# Patient Record
Sex: Male | Born: 1955 | Race: White | Hispanic: No | Marital: Married | Smoking: Never smoker
Health system: Southern US, Community
[De-identification: ages and names within clinical notes are randomized; demographics above are authoritative.]

## PROBLEM LIST (undated history)

## (undated) DIAGNOSIS — D759 Disease of blood and blood-forming organs, unspecified: Secondary | ICD-10-CM

## (undated) DIAGNOSIS — J189 Pneumonia, unspecified organism: Secondary | ICD-10-CM

## (undated) DIAGNOSIS — H3320 Serous retinal detachment, unspecified eye: Secondary | ICD-10-CM

## (undated) DIAGNOSIS — C801 Malignant (primary) neoplasm, unspecified: Secondary | ICD-10-CM

## (undated) DIAGNOSIS — Z8546 Personal history of malignant neoplasm of prostate: Secondary | ICD-10-CM

## (undated) DIAGNOSIS — F419 Anxiety disorder, unspecified: Secondary | ICD-10-CM

## (undated) DIAGNOSIS — Z85820 Personal history of malignant melanoma of skin: Secondary | ICD-10-CM

## (undated) DIAGNOSIS — Z86010 Personal history of colonic polyps: Secondary | ICD-10-CM

## (undated) DIAGNOSIS — J45909 Unspecified asthma, uncomplicated: Secondary | ICD-10-CM

## (undated) DIAGNOSIS — Z973 Presence of spectacles and contact lenses: Secondary | ICD-10-CM

## (undated) DIAGNOSIS — T7840XA Allergy, unspecified, initial encounter: Secondary | ICD-10-CM

## (undated) DIAGNOSIS — Z85828 Personal history of other malignant neoplasm of skin: Secondary | ICD-10-CM

## (undated) DIAGNOSIS — C61 Malignant neoplasm of prostate: Secondary | ICD-10-CM

## (undated) DIAGNOSIS — G4733 Obstructive sleep apnea (adult) (pediatric): Secondary | ICD-10-CM

## (undated) DIAGNOSIS — K219 Gastro-esophageal reflux disease without esophagitis: Secondary | ICD-10-CM

## (undated) DIAGNOSIS — M1990 Unspecified osteoarthritis, unspecified site: Secondary | ICD-10-CM

## (undated) DIAGNOSIS — S83209A Unspecified tear of unspecified meniscus, current injury, unspecified knee, initial encounter: Secondary | ICD-10-CM

## (undated) DIAGNOSIS — I2510 Atherosclerotic heart disease of native coronary artery without angina pectoris: Secondary | ICD-10-CM

## (undated) DIAGNOSIS — D696 Thrombocytopenia, unspecified: Secondary | ICD-10-CM

## (undated) DIAGNOSIS — Z8669 Personal history of other diseases of the nervous system and sense organs: Secondary | ICD-10-CM

## (undated) DIAGNOSIS — E119 Type 2 diabetes mellitus without complications: Secondary | ICD-10-CM

## (undated) DIAGNOSIS — Z9889 Other specified postprocedural states: Secondary | ICD-10-CM

## (undated) DIAGNOSIS — E785 Hyperlipidemia, unspecified: Secondary | ICD-10-CM

## (undated) DIAGNOSIS — H269 Unspecified cataract: Secondary | ICD-10-CM

## (undated) DIAGNOSIS — Z9109 Other allergy status, other than to drugs and biological substances: Secondary | ICD-10-CM

## (undated) DIAGNOSIS — G4730 Sleep apnea, unspecified: Secondary | ICD-10-CM

## (undated) HISTORY — DX: Allergy, unspecified, initial encounter: T78.40XA

## (undated) HISTORY — PX: COLONOSCOPY: SHX174

## (undated) HISTORY — PX: NASAL SEPTOPLASTY W/ TURBINOPLASTY: SHX2070

## (undated) HISTORY — PX: TONSILLECTOMY AND ADENOIDECTOMY: SUR1326

## (undated) HISTORY — PX: REPLACEMENT TOTAL KNEE: SUR1224

## (undated) HISTORY — PX: EYE SURGERY: SHX253

## (undated) HISTORY — DX: Unspecified asthma, uncomplicated: J45.909

## (undated) HISTORY — DX: Malignant (primary) neoplasm, unspecified: C80.1

## (undated) HISTORY — PX: OTHER SURGICAL HISTORY: SHX169

## (undated) HISTORY — DX: Thrombocytopenia, unspecified: D69.6

## (undated) HISTORY — DX: Hyperlipidemia, unspecified: E78.5

## (undated) HISTORY — DX: Anxiety disorder, unspecified: F41.9

## (undated) HISTORY — DX: Serous retinal detachment, unspecified eye: H33.20

## (undated) HISTORY — DX: Unspecified cataract: H26.9

## (undated) HISTORY — DX: Personal history of colonic polyps: Z86.010

---

## 2001-12-13 ENCOUNTER — Ambulatory Visit (HOSPITAL_BASED_OUTPATIENT_CLINIC_OR_DEPARTMENT_OTHER): Admission: RE | Admit: 2001-12-13 | Discharge: 2001-12-13 | Payer: Self-pay | Admitting: Otolaryngology

## 2002-12-25 ENCOUNTER — Encounter: Admission: RE | Admit: 2002-12-25 | Discharge: 2003-03-25 | Payer: Self-pay | Admitting: Endocrinology

## 2004-05-17 ENCOUNTER — Ambulatory Visit (HOSPITAL_BASED_OUTPATIENT_CLINIC_OR_DEPARTMENT_OTHER): Admission: RE | Admit: 2004-05-17 | Discharge: 2004-05-17 | Payer: Self-pay | Admitting: Internal Medicine

## 2004-09-03 ENCOUNTER — Ambulatory Visit: Payer: Self-pay | Admitting: Internal Medicine

## 2004-12-22 ENCOUNTER — Ambulatory Visit: Payer: Self-pay | Admitting: Internal Medicine

## 2005-08-02 ENCOUNTER — Ambulatory Visit: Payer: Self-pay | Admitting: Internal Medicine

## 2006-04-04 ENCOUNTER — Ambulatory Visit: Payer: Self-pay | Admitting: Internal Medicine

## 2006-04-04 LAB — CONVERTED CEMR LAB
ALT: 21 units/L (ref 0–40)
AST: 21 units/L (ref 0–37)
Albumin: 4.2 g/dL (ref 3.5–5.2)
Alkaline Phosphatase: 51 units/L (ref 39–117)
BUN: 21 mg/dL (ref 6–23)
Basophils Absolute: 0 10*3/uL (ref 0.0–0.1)
Basophils Relative: 0.5 % (ref 0.0–1.0)
Bilirubin Urine: NEGATIVE
Calcium: 9.2 mg/dL (ref 8.4–10.5)
Chol/HDL Ratio, serum: 2.7
Cholesterol: 117 mg/dL (ref 0–200)
Glomerular Filtration Rate, Af Am: 82 mL/min/{1.73_m2}
Hemoglobin: 14.2 g/dL (ref 13.0–17.0)
Ketones, ur: NEGATIVE mg/dL
LDL Cholesterol: 68 mg/dL (ref 0–99)
Leukocytes, UA: NEGATIVE
Lymphocytes Relative: 17.4 % (ref 12.0–46.0)
Monocytes Relative: 5.9 % (ref 3.0–11.0)
Mucus, UA: NEGATIVE
Neutrophils Relative %: 75.6 % (ref 43.0–77.0)
Nitrite: NEGATIVE
PSA: 0.84 ng/mL (ref 0.10–4.00)
Platelets: 143 10*3/uL — ABNORMAL LOW (ref 150–400)
Potassium: 3.5 meq/L (ref 3.5–5.1)
RDW: 12.2 % (ref 11.5–14.6)
TSH: 0.73 microintl units/mL (ref 0.35–5.50)
Total Bilirubin: 1.1 mg/dL (ref 0.3–1.2)
Total CK: 238 units/L (ref 7–195)
Total Protein, Urine: NEGATIVE mg/dL
Total Protein: 6 g/dL (ref 6.0–8.3)
Triglyceride fasting, serum: 29 mg/dL (ref 0–149)
Urine Glucose: 100 mg/dL — AB
Urobilinogen, UA: 0.2 (ref 0.0–1.0)
WBC: 4.3 10*3/uL — ABNORMAL LOW (ref 4.5–10.5)

## 2006-04-12 ENCOUNTER — Ambulatory Visit: Payer: Self-pay | Admitting: Internal Medicine

## 2006-10-06 ENCOUNTER — Encounter (INDEPENDENT_AMBULATORY_CARE_PROVIDER_SITE_OTHER): Payer: Self-pay | Admitting: Specialist

## 2006-10-06 ENCOUNTER — Ambulatory Visit: Payer: Self-pay | Admitting: Internal Medicine

## 2006-10-25 ENCOUNTER — Ambulatory Visit: Payer: Self-pay | Admitting: Internal Medicine

## 2006-10-25 LAB — CONVERTED CEMR LAB
AST: 26 units/L (ref 0–37)
Albumin: 4.1 g/dL (ref 3.5–5.2)
Basophils Absolute: 0 10*3/uL (ref 0.0–0.1)
Bilirubin, Direct: 0.2 mg/dL (ref 0.0–0.3)
Cholesterol: 109 mg/dL (ref 0–200)
Creatinine,U: 164.1 mg/dL
Eosinophils Absolute: 0.1 10*3/uL (ref 0.0–0.6)
Eosinophils Relative: 1.2 % (ref 0.0–5.0)
GFR calc Af Amer: 101 mL/min
GFR calc non Af Amer: 84 mL/min
Glucose, Bld: 106 mg/dL — ABNORMAL HIGH (ref 70–99)
HCT: 38 % — ABNORMAL LOW (ref 39.0–52.0)
Hgb A1c MFr Bld: 5.4 % (ref 4.6–6.0)
Lymphocytes Relative: 22.5 % (ref 12.0–46.0)
MCHC: 35 g/dL (ref 30.0–36.0)
MCV: 91.1 fL (ref 78.0–100.0)
Microalb Creat Ratio: 9.1 mg/g (ref 0.0–30.0)
Microalb, Ur: 1.5 mg/dL (ref 0.0–1.9)
Monocytes Absolute: 0.3 10*3/uL (ref 0.2–0.7)
Neutro Abs: 3.2 10*3/uL (ref 1.4–7.7)
Neutrophils Relative %: 69.6 % (ref 43.0–77.0)
Potassium: 4.8 meq/L (ref 3.5–5.1)
Sodium: 149 meq/L — ABNORMAL HIGH (ref 135–145)
Testosterone: 391.39 ng/dL (ref 350.00–890)
Total CHOL/HDL Ratio: 2.4
WBC: 4.6 10*3/uL (ref 4.5–10.5)

## 2007-02-03 DIAGNOSIS — R809 Proteinuria, unspecified: Secondary | ICD-10-CM | POA: Insufficient documentation

## 2007-02-03 DIAGNOSIS — E119 Type 2 diabetes mellitus without complications: Secondary | ICD-10-CM

## 2007-02-03 DIAGNOSIS — G4739 Other sleep apnea: Secondary | ICD-10-CM

## 2007-02-03 DIAGNOSIS — J45909 Unspecified asthma, uncomplicated: Secondary | ICD-10-CM | POA: Insufficient documentation

## 2007-02-03 DIAGNOSIS — E785 Hyperlipidemia, unspecified: Secondary | ICD-10-CM | POA: Insufficient documentation

## 2007-03-15 ENCOUNTER — Ambulatory Visit: Payer: Self-pay | Admitting: Internal Medicine

## 2007-03-15 LAB — CONVERTED CEMR LAB
AST: 41 units/L — ABNORMAL HIGH (ref 0–37)
Albumin: 4.3 g/dL (ref 3.5–5.2)
Alkaline Phosphatase: 35 units/L — ABNORMAL LOW (ref 39–117)
BUN: 21 mg/dL (ref 6–23)
Basophils Relative: 0.1 % (ref 0.0–1.0)
Bilirubin Urine: NEGATIVE
Eosinophils Relative: 1.2 % (ref 0.0–5.0)
GFR calc Af Amer: 82 mL/min
GFR calc non Af Amer: 68 mL/min
Hemoglobin: 13.1 g/dL (ref 13.0–17.0)
Leukocytes, UA: NEGATIVE
Monocytes Relative: 6.7 % (ref 3.0–11.0)
Nitrite: NEGATIVE
Platelets: 139 10*3/uL — ABNORMAL LOW (ref 150–400)
Potassium: 4.8 meq/L (ref 3.5–5.1)
RDW: 13 % (ref 11.5–14.6)
Specific Gravity, Urine: 1.02 (ref 1.000–1.03)
Total Bilirubin: 1.1 mg/dL (ref 0.3–1.2)
Total Protein, Urine: NEGATIVE mg/dL
Total Protein: 6.5 g/dL (ref 6.0–8.3)
Triglycerides: 39 mg/dL (ref 0–149)
Urine Glucose: NEGATIVE mg/dL
VLDL: 8 mg/dL (ref 0–40)
WBC: 5.5 10*3/uL (ref 4.5–10.5)
pH: 6.5 (ref 5.0–8.0)

## 2007-05-31 ENCOUNTER — Encounter: Payer: Self-pay | Admitting: Internal Medicine

## 2007-05-31 ENCOUNTER — Telehealth: Payer: Self-pay | Admitting: Internal Medicine

## 2007-09-12 ENCOUNTER — Ambulatory Visit: Payer: Self-pay | Admitting: Internal Medicine

## 2007-09-16 LAB — CONVERTED CEMR LAB
ALT: 26 units/L (ref 0–53)
Albumin: 4.1 g/dL (ref 3.5–5.2)
Alkaline Phosphatase: 51 units/L (ref 39–117)
BUN: 20 mg/dL (ref 6–23)
Basophils Relative: 0.7 % (ref 0.0–1.0)
Bilirubin Urine: NEGATIVE
Calcium: 9.7 mg/dL (ref 8.4–10.5)
Crystals: NEGATIVE
GFR calc Af Amer: 90 mL/min
GFR calc non Af Amer: 75 mL/min
Hgb A1c MFr Bld: 5.8 % (ref 4.6–6.0)
LDL Cholesterol: 66 mg/dL (ref 0–99)
Lymphocytes Relative: 16.1 % (ref 12.0–46.0)
Microalb Creat Ratio: 27 mg/g (ref 0.0–30.0)
Monocytes Relative: 5.8 % (ref 3.0–11.0)
Neutro Abs: 4.7 10*3/uL (ref 1.4–7.7)
Platelets: 145 10*3/uL — ABNORMAL LOW (ref 150–400)
Specific Gravity, Urine: 1.025 (ref 1.000–1.03)
TSH: 0.89 microintl units/mL (ref 0.35–5.50)
Testosterone: 343.23 ng/dL — ABNORMAL LOW (ref 350.00–890)
Total CHOL/HDL Ratio: 2.7
Total Protein, Urine: NEGATIVE mg/dL
Triglycerides: 23 mg/dL (ref 0–149)
Urine Glucose: NEGATIVE mg/dL
VLDL: 5 mg/dL (ref 0–40)
WBC, UA: NONE SEEN cells/hpf
pH: 5.5 (ref 5.0–8.0)

## 2007-10-30 ENCOUNTER — Encounter: Payer: Self-pay | Admitting: Pulmonary Disease

## 2007-10-30 DIAGNOSIS — G4733 Obstructive sleep apnea (adult) (pediatric): Secondary | ICD-10-CM

## 2007-11-27 ENCOUNTER — Encounter: Payer: Self-pay | Admitting: Critical Care Medicine

## 2008-01-22 ENCOUNTER — Telehealth: Payer: Self-pay | Admitting: Internal Medicine

## 2008-01-29 ENCOUNTER — Ambulatory Visit: Payer: Self-pay | Admitting: Internal Medicine

## 2008-01-29 LAB — CONVERTED CEMR LAB
ALT: 24 units/L (ref 0–53)
AST: 27 units/L (ref 0–37)
Alkaline Phosphatase: 43 units/L (ref 39–117)
Basophils Absolute: 0 10*3/uL (ref 0.0–0.1)
Bilirubin Urine: NEGATIVE
Bilirubin, Direct: 0.2 mg/dL (ref 0.0–0.3)
CO2: 29 meq/L (ref 19–32)
Calcium: 9.6 mg/dL (ref 8.4–10.5)
Chloride: 108 meq/L (ref 96–112)
Glucose, Bld: 95 mg/dL (ref 70–99)
Hemoglobin: 14.1 g/dL (ref 13.0–17.0)
LDL Cholesterol: 55 mg/dL (ref 0–99)
Leukocytes, UA: NEGATIVE
Lymphocytes Relative: 21.1 % (ref 12.0–46.0)
Microalb, Ur: 0.2 mg/dL (ref 0.0–1.9)
Monocytes Relative: 6.4 % (ref 3.0–12.0)
Neutro Abs: 3.6 10*3/uL (ref 1.4–7.7)
Neutrophils Relative %: 71.3 % (ref 43.0–77.0)
Nitrite: NEGATIVE
RBC: 4.27 M/uL (ref 4.22–5.81)
RDW: 11.8 % (ref 11.5–14.6)
Sodium: 142 meq/L (ref 135–145)
Specific Gravity, Urine: 1.015 (ref 1.000–1.03)
Total Bilirubin: 1.1 mg/dL (ref 0.3–1.2)
Total CHOL/HDL Ratio: 2.6
Total Protein: 6.1 g/dL (ref 6.0–8.3)
Urobilinogen, UA: 0.2 (ref 0.0–1.0)
Vit D, 1,25-Dihydroxy: 80 (ref 30–89)
pH: 7.5 (ref 5.0–8.0)

## 2008-02-07 ENCOUNTER — Encounter: Payer: Self-pay | Admitting: Internal Medicine

## 2008-04-14 ENCOUNTER — Telehealth: Payer: Self-pay | Admitting: Internal Medicine

## 2008-08-04 ENCOUNTER — Telehealth: Payer: Self-pay | Admitting: Internal Medicine

## 2008-08-12 ENCOUNTER — Ambulatory Visit: Payer: Self-pay | Admitting: Internal Medicine

## 2008-08-12 LAB — CONVERTED CEMR LAB
Albumin: 4 g/dL (ref 3.5–5.2)
BUN: 27 mg/dL — ABNORMAL HIGH (ref 6–23)
Bacteria, UA: NEGATIVE
Basophils Absolute: 0 10*3/uL (ref 0.0–0.1)
Basophils Relative: 0.3 % (ref 0.0–3.0)
Cholesterol: 119 mg/dL (ref 0–200)
Creatinine, Ser: 1.1 mg/dL (ref 0.4–1.5)
Crystals: NEGATIVE
Eosinophils Absolute: 0.1 10*3/uL (ref 0.0–0.7)
Eosinophils Relative: 1.7 % (ref 0.0–5.0)
GFR calc Af Amer: 90 mL/min
GFR calc non Af Amer: 74 mL/min
HCT: 41.6 % (ref 39.0–52.0)
HDL: 52.5 mg/dL (ref >39.0)
Hgb A1c MFr Bld: 5.7 % (ref 4.6–6.0)
Ketones, ur: NEGATIVE mg/dL
LDL Cholesterol: 58 mg/dL (ref 0–99)
MCHC: 34.8 g/dL (ref 30.0–36.0)
MCV: 93.4 fL (ref 78.0–100.0)
Monocytes Absolute: 0.3 10*3/uL (ref 0.1–1.0)
Mucus, UA: NEGATIVE
Neutrophils Relative %: 66.7 % (ref 43.0–77.0)
PSA: 1.12 ng/mL (ref 0.10–4.00)
Platelets: 121 10*3/uL — ABNORMAL LOW (ref 150–400)
TSH: 0.63 microintl units/mL (ref 0.35–5.50)
Testosterone: 387.9 ng/dL (ref 350.00–890)
Total Bilirubin: 1.4 mg/dL — ABNORMAL HIGH (ref 0.3–1.2)
Total Protein, Urine: NEGATIVE mg/dL
Triglycerides: 42 mg/dL (ref 0–149)
Urine Glucose: NEGATIVE mg/dL
Urobilinogen, UA: 0.2 (ref 0.0–1.0)
VLDL: 8 mg/dL (ref 0–40)
Vit D, 25-Hydroxy: 42 ng/mL (ref 30–89)
WBC: 4.4 10*3/uL — ABNORMAL LOW (ref 4.5–10.5)

## 2008-08-15 ENCOUNTER — Ambulatory Visit: Payer: Self-pay | Admitting: Internal Medicine

## 2008-08-15 DIAGNOSIS — R7989 Other specified abnormal findings of blood chemistry: Secondary | ICD-10-CM | POA: Insufficient documentation

## 2008-08-15 DIAGNOSIS — R945 Abnormal results of liver function studies: Secondary | ICD-10-CM | POA: Insufficient documentation

## 2008-09-05 ENCOUNTER — Telehealth: Payer: Self-pay | Admitting: Internal Medicine

## 2008-12-11 ENCOUNTER — Ambulatory Visit: Payer: Self-pay | Admitting: Internal Medicine

## 2008-12-15 LAB — CONVERTED CEMR LAB
ALT: 35 units/L (ref 0–53)
BUN: 25 mg/dL — ABNORMAL HIGH (ref 6–23)
Basophils Relative: 0.2 % (ref 0.0–3.0)
Calcium: 9.3 mg/dL (ref 8.4–10.5)
Cholesterol: 112 mg/dL (ref 0–200)
Creatinine, Ser: 1.2 mg/dL (ref 0.4–1.5)
Eosinophils Absolute: 0.1 10*3/uL (ref 0.0–0.7)
GFR calc non Af Amer: 67.13 mL/min (ref >60)
HDL: 54 mg/dL (ref >39.00)
LDL Cholesterol: 54 mg/dL (ref 0–99)
MCHC: 34.9 g/dL (ref 30.0–36.0)
MCV: 94.1 fL (ref 78.0–100.0)
Monocytes Absolute: 0.3 10*3/uL (ref 0.1–1.0)
Neutro Abs: 2.9 10*3/uL (ref 1.4–7.7)
Neutrophils Relative %: 66.4 % (ref 43.0–77.0)
RBC: 4.21 M/uL — ABNORMAL LOW (ref 4.22–5.81)
RDW: 12.1 % (ref 11.5–14.6)
Testosterone: 324.13 ng/dL — ABNORMAL LOW (ref 350.00–890.00)
Total Bilirubin: 1.1 mg/dL (ref 0.3–1.2)
Triglycerides: 20 mg/dL (ref 0.0–149.0)

## 2009-02-11 ENCOUNTER — Ambulatory Visit: Payer: Self-pay | Admitting: Sports Medicine

## 2009-02-11 DIAGNOSIS — M722 Plantar fascial fibromatosis: Secondary | ICD-10-CM

## 2009-02-11 DIAGNOSIS — M7660 Achilles tendinitis, unspecified leg: Secondary | ICD-10-CM

## 2009-04-01 ENCOUNTER — Encounter (INDEPENDENT_AMBULATORY_CARE_PROVIDER_SITE_OTHER): Payer: Self-pay | Admitting: *Deleted

## 2009-06-02 ENCOUNTER — Ambulatory Visit: Payer: Self-pay | Admitting: Internal Medicine

## 2009-06-02 LAB — CONVERTED CEMR LAB
BUN: 23 mg/dL (ref 6–23)
Bilirubin, Direct: 0.2 mg/dL (ref 0.0–0.3)
Chloride: 109 meq/L (ref 96–112)
Cholesterol: 146 mg/dL (ref 0–200)
Creatinine,U: 104.1 mg/dL
Eosinophils Absolute: 0.1 10*3/uL (ref 0.0–0.7)
GFR calc non Af Amer: 61.1 mL/min (ref >60)
LDL Cholesterol: 82 mg/dL (ref 0–99)
MCHC: 33.6 g/dL (ref 30.0–36.0)
MCV: 97.3 fL (ref 78.0–100.0)
Microalb Creat Ratio: 9.6 mg/g (ref 0.0–30.0)
Monocytes Absolute: 0.3 10*3/uL (ref 0.1–1.0)
Neutrophils Relative %: 74.3 % (ref 43.0–77.0)
Platelets: 125 10*3/uL — ABNORMAL LOW (ref 150.0–400.0)
Potassium: 5 meq/L (ref 3.5–5.1)
Sodium: 147 meq/L — ABNORMAL HIGH (ref 135–145)
Total Bilirubin: 1.1 mg/dL (ref 0.3–1.2)
Triglycerides: 33 mg/dL (ref 0.0–149.0)
VLDL: 6.6 mg/dL (ref 0.0–40.0)

## 2009-09-16 ENCOUNTER — Encounter: Payer: Self-pay | Admitting: Internal Medicine

## 2009-10-20 ENCOUNTER — Ambulatory Visit: Payer: Self-pay | Admitting: Internal Medicine

## 2009-10-20 LAB — CONVERTED CEMR LAB
AST: 32 units/L (ref 0–37)
Albumin: 4.2 g/dL (ref 3.5–5.2)
Basophils Absolute: 0 10*3/uL (ref 0.0–0.1)
Basophils Relative: 0.8 % (ref 0.0–3.0)
Bilirubin Urine: NEGATIVE
CO2: 28 meq/L (ref 19–32)
Chloride: 109 meq/L (ref 96–112)
Eosinophils Absolute: 0.1 10*3/uL (ref 0.0–0.7)
Glucose, Bld: 96 mg/dL (ref 70–99)
HCT: 40.9 % (ref 39.0–52.0)
HDL: 46.9 mg/dL (ref >39.00)
Hemoglobin: 14.1 g/dL (ref 13.0–17.0)
Hgb A1c MFr Bld: 5.8 % (ref 4.6–6.5)
Leukocytes, UA: NEGATIVE
Lymphs Abs: 1.2 10*3/uL (ref 0.7–4.0)
MCHC: 34.4 g/dL (ref 30.0–36.0)
Neutro Abs: 3 10*3/uL (ref 1.4–7.7)
Nitrite: NEGATIVE
PSA: 1.83 ng/mL (ref 0.10–4.00)
Potassium: 4.1 meq/L (ref 3.5–5.1)
RBC: 4.37 M/uL (ref 4.22–5.81)
RDW: 13.4 % (ref 11.5–14.6)
Sodium: 142 meq/L (ref 135–145)
Specific Gravity, Urine: 1.025 (ref 1.000–1.030)
TSH: 0.68 microintl units/mL (ref 0.35–5.50)
Total CHOL/HDL Ratio: 4
Total Protein, Urine: NEGATIVE mg/dL
Total Protein: 5.8 g/dL — ABNORMAL LOW (ref 6.0–8.3)
Triglycerides: 37 mg/dL (ref 0.0–149.0)
pH: 5.5 (ref 5.0–8.0)

## 2009-10-28 ENCOUNTER — Ambulatory Visit: Payer: Self-pay | Admitting: Internal Medicine

## 2009-10-28 DIAGNOSIS — E291 Testicular hypofunction: Secondary | ICD-10-CM

## 2009-10-28 DIAGNOSIS — D485 Neoplasm of uncertain behavior of skin: Secondary | ICD-10-CM

## 2009-10-28 DIAGNOSIS — H6120 Impacted cerumen, unspecified ear: Secondary | ICD-10-CM

## 2009-10-28 DIAGNOSIS — H60399 Other infective otitis externa, unspecified ear: Secondary | ICD-10-CM | POA: Insufficient documentation

## 2010-01-27 ENCOUNTER — Telehealth: Payer: Self-pay | Admitting: Internal Medicine

## 2010-03-19 ENCOUNTER — Telehealth: Payer: Self-pay | Admitting: Internal Medicine

## 2010-03-24 ENCOUNTER — Telehealth (INDEPENDENT_AMBULATORY_CARE_PROVIDER_SITE_OTHER): Payer: Self-pay | Admitting: *Deleted

## 2010-04-12 ENCOUNTER — Ambulatory Visit: Payer: Self-pay | Admitting: Sports Medicine

## 2010-04-12 DIAGNOSIS — M79609 Pain in unspecified limb: Secondary | ICD-10-CM | POA: Insufficient documentation

## 2010-05-12 ENCOUNTER — Encounter: Payer: Self-pay | Admitting: Internal Medicine

## 2010-06-11 ENCOUNTER — Ambulatory Visit: Payer: Self-pay | Admitting: Internal Medicine

## 2010-06-14 LAB — CONVERTED CEMR LAB
ALT: 33 units/L (ref 0–53)
AST: 34 units/L (ref 0–37)
Alkaline Phosphatase: 50 units/L (ref 39–117)
Bilirubin, Direct: 0.1 mg/dL (ref 0.0–0.3)
CO2: 28 meq/L (ref 19–32)
Calcium: 9.4 mg/dL (ref 8.4–10.5)
Cholesterol: 114 mg/dL (ref 0–200)
Creatinine,U: 93.2 mg/dL
Hgb A1c MFr Bld: 5.8 % (ref 4.6–6.5)
Ketones, ur: NEGATIVE mg/dL
LDL Cholesterol: 58 mg/dL (ref 0–99)
Sodium: 141 meq/L (ref 135–145)
Specific Gravity, Urine: 1.025 (ref 1.000–1.030)
Total Protein, Urine: NEGATIVE mg/dL
Total Protein: 6.4 g/dL (ref 6.0–8.3)
Triglycerides: 36 mg/dL (ref 0.0–149.0)
Urine Glucose: NEGATIVE mg/dL
pH: 5.5 (ref 5.0–8.0)

## 2010-07-27 NOTE — Miscellaneous (Signed)
Summary: Lipitor  Clinical Lists Changes  Medications: Rx of LIPITOR 20 MG TABS (ATORVASTATIN CALCIUM) Take 1 tab by mouth daily;  #90 x 3;  Signed;  Entered by: Tresa Garter MD;  Authorized by: Tresa Garter MD;  Method used: Electronically to [facility]*, [address], [address] Shipping/mailing, [place], [place]  [address], Ph: [phone], Fax: [phone]    Prescriptions: LIPITOR 20 MG TABS (ATORVASTATIN CALCIUM) Take 1 tab by mouth daily  #90 x 3   Entered and Authorized by:   Tresa Garter MD   Signed by:   Tresa Garter MD on 05/12/2010   Method used:   Electronically to        [facility]* (retail)       [address]       [address] Shipping/mailing       [place], [place]  [address]       Ph: [phone]       Fax: [phone]   RxID:   [id number]

## 2010-07-27 NOTE — Assessment & Plan Note (Signed)
Summary: CPX/ ALREADY DID THE LABS/NWS   Vital Signs:  Patient profile:   55 year old male Height:      73 inches Weight:      194.25 pounds BMI:     25.72 O2 Sat:      96 % on Room air Temp:     97.6 degrees F oral Pulse rate:   59 / minute BP sitting:   100 / 50  (left arm) Cuff size:   regular  Vitals Entered By: Lucious Groves (Oct 28, 2009 10:38 AM)  O2 Flow:  Room  air  CC: CPX./kb Is Patient Diabetic? Yes Pain Assessment Patient in pain? no        CC:  CPX./kb.  History of Present Illness: The patient presents for a wellness examination   Allergies (verified): 1)  ! Ace Inhibitors 2)  ! Sporanox Pulsepak (Itraconazole)  Past History:  Past Medical History: Last updated: 08/15/2008 Asthma Diabetes mellitus, type II OSA ON C PAP MOLES ON BACK Abn CBC - decr. WBC and PLT 2009 Abn LFTs  Family History: Last updated: 02/03/2007 Alzheimer's - F  Past Surgical History: Denies surgical history  Family History: Reviewed history from 02/03/2007 and no changes required. Alzheimer's - F  Social History: Occupation: MD pulmon/CC Married Never Smoked Regular exercise-yes - runner  Review of Systems  The patient denies anorexia, fever, weight loss, weight gain, vision loss, decreased hearing, hoarseness, chest pain, syncope, dyspnea on exertion, peripheral edema, prolonged cough, headaches, hemoptysis, abdominal pain, melena, hematochezia, severe indigestion/heartburn, hematuria, incontinence, genital sores, muscle weakness, suspicious skin lesions, transient blindness, difficulty walking, depression, unusual weight change,  abnormal bleeding, enlarged lymph nodes, angioedema, and testicular masses.    Physical Exam  General:  Well-developed,well-nourished,in no acute distress; alert,appropriate and cooperative throughout examination Head:  Normocephalic and atraumatic without obvious abnormalities. No apparent alopecia or balding. Eyes:  No corneal or conjunctival inflammation noted. EOMI. Perrla. Funduscopic exam benign, without hemorrhages, exudates or papilledema. Vision grossly normal. Ears:  B wax Nose:  External nasal examination shows no deformity or inflammation. Nasal mucosa are pink and moist without lesions or exudates. Mouth:  Oral mucosa and oropharynx without lesions or exudates.  Teeth in good repair. Neck:  No deformities, masses, or tenderness noted. Lungs:  Normal respiratory effort, chest expands symmetrically. Lungs are clear to auscultation, no crackles or wheezes. Heart:  Normal rate and regular rhythm. S1 and S2 normal without gallop, murmur, click, rub or other extra sounds. Abdomen:  Bowel sounds positive,abdomen soft and non-tender without masses, organomegaly or hernias noted. Rectal:  No external abnormalities noted. Normal sphincter tone. No rectal masses or tenderness. G(-) Genitalia:  NE Prostate:  1+ enlarged.   Msk:  mod loss of long arch bilat there is some swelling at medial insertion of PF on RT there is also mod TTP at this area RT AT is thickened but not tender Lt AT seems normal  transverse arch is OK Pulses:  R and L carotid,radial,femoral,dorsalis pedis and posterior tibial pulses are full and equal bilaterally Extremities:  No clubbing, cyanosis, edema, or deformity noted with normal full range of motion of all joints.   Neurologic:  No cranial nerve deficits noted. Station and gait are normal. Plantar reflexes are down-going bilaterally. DTRs are symmetrical throughout. Sensory, motor and coordinative functions appear intact. Skin:  Intact without suspicious lesions  or rashes; moles on back unchanged Cervical Nodes:  No lymphadenopathy noted Inguinal Nodes:  No significant adenopathy Psych:  Cognition and judgment appear intact. Alert and cooperative with normal attention span and concentration. No apparent delusions, illusions, hallucinations   Impression & Recommendations:  Problem # 1:  WELL ADULT EXAM (ICD-V70.0) Assessment New  Health and age related issues were discussed. Available screening tests and vaccinations were discussed as well. Healthy life style including good diet and execise was discussed. See "Patient Instructions".  The labs were reviewed with the patient.  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 1 by mouth qd His updated medication list for this problem includes:    Adult Aspirin Ec Low Strength 81 Mg Tbec (Aspirin) ..... Once daily    Benicar 20 Mg Tabs (Olmesartan medoxomil) .[name] Kitchen... 1 once daily    Actoplus Met 15-500 Mg Tabs (Pioglitazone hcl-metformin hcl) .[name] Kitchen... 1po two times a day (hold for running; hold for Marathons x 3 d. Stop 1 d prior.  Problem # 4:  ABNORMAL LABS (ICD-790.6) (CBC) Assessment: Improved  Problem # 5:  CERUMEN IMPACTION (ICD-380.4) Assessment: Deteriorated Procedure: ear irrigation Reason: wax impaction Risks/benefis were discussed. Both ears were irrigated with warm water. Large ammount of wax was recovered. Instrumentation with metal ear loop was performed to accomplish the removal. Tolerated well  on L Complications: pain in R ear due to #6, some wax was left behind R  Problem # 6:  OTITIS EXTERNA (ICD-380.10) R Assessment: New  His updated medication list for this problem includes:    Cortisporin 3.5-10000-1 Soln (Neomycin-polymyxin-hc) .[name] KitchenMarland KitchenMarland KitchenMarland Kitchen 3 gtt in r ear tid  Problem # 7:  OBSTRUCTIVE SLEEP APNEA (ICD-327.23) Assessment: Unchanged On CPAP  Problem # 8:  HYPOGONADISM (ICD-257.2) Assessment: Improved Better on DHEA in his supplement  Problem # 9:  NEOPLASM OF UNCERTAIN BEHAVIOR OF SKIN (ICD-238.2) Assessment: Unchanged  Complete Medication List: 1)  Adult Aspirin Ec Low Strength 81 Mg Tbec (Aspirin) .... Once daily 2)  Multivitamins Tabs (Multiple vitamin) .... Once daily 3)  Benicar 20 Mg Tabs (Olmesartan medoxomil) .[name] Kitchen.. 1 once daily 4)  Lipitor 20 Mg Tabs (Atorvastatin calcium) .... Take 1 tab by mouth daily 5)  Flomax 0.4 Mg Cp24 (Tamsulosin hcl) .... Take 1 capsule by mouth nightly 6)  Actoplus Met 15-500 Mg Tabs (Pioglitazone hcl-metformin hcl) .[name] Kitchen.. 1po bid 7)  Freestyle Test Strp (Glucose blood) .... Once daily prn 8)  Freestyle Lancets Misc (Lancets) .... Once daily prn 9)  Vitamin D3 1000 Unit Caps (Cholecalciferol) .... 2 daily 10)  Cortisporin 3.5-10000-1 Soln (Neomycin-polymyxin-hc) .... 3 gtt in r ear tid  Patient Instructions: 1)  Try to eat more raw plant food, fresh and dry fruit, raw almonds, leafy vegetables, whole foods and less red meat, less animal fat. Poultry and fish is better for you than pork and beef. Avoid processed foods (canned soups, hot dogs, sausage, bacon , frozen dinners). Avoid corn syrup, high fructose syrup or aspartam and Splenda  containing drinks. Honey, Agave and Stevia are better sweeteners. Make your own  dressing with olive oil, wine vinegar, lemon juce, garlic etc. for your salads. 2)  Running socks 3)  Do not take ActosplusMet  and Benicar when running as we discussed 4)  Labs in 4 months  Prescriptions: CORTISPORIN  3.5-10000-1 SOLN (NEOMYCIN-POLYMYXIN-HC) 3 gtt in R ear tid  #1 x 1   Entered and Authorized by:   Tresa Garter MD   Signed by:   Tresa Garter MD on 10/28/2009   Method used:   Electronically to        [facility]* (retail)       [address]       [address] Shipping/mailing  [place], [place]  [address]       Ph: [phone]       Fax: [phone]   RxID:   [id number] VITAMIN D3 1000 UNIT CAPS (CHOLECALCIFEROL) 2 daily  #200 x 3   Entered and Authorized by:   Tresa Garter MD   Signed by:   Tresa Garter MD on 10/28/2009   Method used:   Print then Give to Patient   RxID:   [id number] FREESTYLE LANCETS  MISC (LANCETS) once daily prn  #50 x 3   Entered and Authorized by:   Tresa Garter MD   Signed by:   Tresa Garter MD on 10/28/2009   Method used:   Print then Give to Patient   RxID:   [id number] FREESTYLE TEST  STRP (GLUCOSE BLOOD) once daily prn  #50 x 3   Entered and Authorized by:   Tresa Garter MD   Signed by:   Tresa Garter MD on 10/28/2009   Method used:   Print then Give to Patient   RxID:   [id number] MULTIVITAMINS   TABS (MULTIPLE VITAMIN) once daily  #100 x 3   Entered and Authorized by:   Tresa Garter MD   Signed by:   Tresa Garter MD on 10/28/2009   Method used:   Print then Give to Patient   RxID:   [id number] ADULT ASPIRIN EC LOW STRENGTH 81 MG  TBEC (ASPIRIN) once daily  #100 x 3   Entered and Authorized by:   Tresa Garter MD   Signed by:   Tresa Garter MD on 10/28/2009   Method used:   Print then Give to Patient   RxID:   [id number] ACTOPLUS MET 15-500 MG TABS (PIOGLITAZONE HCL-METFORMIN HCL) 1po bid  #180 x 0   Entered and Authorized by:   Tresa Garter MD   Signed by:   Tresa Garter MD on 10/28/2009   Method used:   Electronically to        [facility]* (retail)       [address]       [address] Shipping/mailing       [place], [place]  [address]       Ph: [phone]       Fax: [phone]   RxID:   [id number] FLOMAX 0.4 MG CP24 (TAMSULOSIN HCL) Take 1 capsule by mouth nightly  #90 Capsule x 3   Entered and Authorized by:   Tresa Garter MD   Signed by:   Tresa Garter MD on 10/28/2009   Method used:   Electronically to        [facility]* (retail)       [address]       [address] Shipping/mailing       [place], [place]  [address]       Ph: [phone]       Fax: [phone]   RxID:   [id number] LIPITOR 20 MG TABS (ATORVASTATIN CALCIUM) Take 1 tab by mouth daily  #90 x 3   Entered and Authorized by:   Tresa Garter MD   Signed by:   Tresa Garter MD on 10/28/2009   Method used:   Electronically to        [facility]* (retail)       [address]       [address] Shipping/mailing       [place], [place]  [address]       Ph: [phone]       Fax: [phone]   RxID:   [id number] BENICAR 20 MG  TABS (OLMESARTAN MEDOXOMIL) 1 once daily  #90 x 3   Entered and Authorized by:   Tresa Garter MD   Signed by:   Tresa Garter MD on 10/28/2009   Method used:   Electronically to        [facility]* (retail)       [address]       [address] Shipping/mailing       [place], [place]  [address]       Ph: [phone]       Fax: [phone]   RxID:   [id number]    Tetanus/Td Vaccine    Vaccine Type: Tdap    Site: left deltoid    Mfr: GlaxoSmithKline    Dose: 0.5 ml    Route: IM    Given by: Lucious Groves    Exp. Date: 09/19/2011    Lot #: [id number]    VIS given: 05/15/07 version given Oct 28, 2009.  Pneumovax Vaccine    Vaccine Type: Pneumovax    Site: right deltoid    Mfr: Merck    Dose: 0.5 ml    Route: IM    Given by: Lucious Groves    Exp. Date: 02/06/2011    Lot #: [id number]    VIS given: 01/23/96 version given Oct 28, 2009.

## 2010-07-27 NOTE — Progress Notes (Signed)
  Phone Note Other Incoming   Request: Send information Summary of Call: Request for records received from [facility]. Request forwarded to [facility].     

## 2010-07-27 NOTE — Progress Notes (Signed)
  Phone Note Refill Request Message from:  Patient on January 27, 2010 1:24 PM  Refills Requested: Medication #1:  ACTOPLUS MET 15-500 MG TABS 1po bid Initial call taken by: Ami Bullins CMA,  January 27, 2010 1:24 PM    Prescriptions: ACTOPLUS MET 15-500 MG TABS (PIOGLITAZONE HCL-METFORMIN HCL) 1po bid  #180 x 3   Entered by:   Ami Bullins CMA   Authorized by:   Tresa Garter MD   Signed by:   Bill Salinas CMA on 01/27/2010   Method used:   Electronically to        [facility]* (retail)       [address]       [address] Shipping/mailing       [place], [place]  [address]       Ph: [phone]       Fax: [phone]   RxID:   [id number]

## 2010-07-27 NOTE — Progress Notes (Signed)
  Phone Note Call from Patient   Summary of Call: Pleasel order CBC, TSH, BMET, Hepatic panel, UA, Lipids, A1c, testost, CK Dx: 401.1, 250.00 272.0  Initial call taken by: Tresa Garter MD,  March 19, 2010 7:42 AM  Follow-up for Phone Call        pls order Follow-up by: Tresa Garter MD,  March 19, 2010 7:44 AM  Additional Follow-up for Phone Call Additional follow up Details #1::        Labs entered in IDX.[name] KitchenMarland KitchenAlvy Beal Archie CMA  March 19, 2010 10:08 AM

## 2010-07-27 NOTE — Letter (Signed)
Summary: Diabetic Eye Exam/Timothy [name] OD  Diabetic Eye Exam/Timothy [name] OD   Imported By: Sherian Rein 10/01/2009 10:13:40  _____________________________________________________________________  External Attachment:    Type:   Image     Comment:   External Document

## 2010-07-27 NOTE — Assessment & Plan Note (Signed)
Summary: [facility]OT[facility]   History of Present Illness: Timothy [name] returns with issues with PF and AT  does not get sxs as long as he wears orthotics for his running Is on track to complete his 4th marthon this year next month  has lost total of 40 lbs and off all meds x occ ibuprofen  current orthotics about 8 mos old would like new pair to restore cushion  Allergies: 1)  ! Ace Inhibitors 2)  ! 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  The orthotic was heated and the patient stood on the orthotic blank positioned on the orthotic stand. The patient was positioned in subtalar neutral position and 10 degrees of ankle dorsiflexion in a weight bearing stance. After completion of molding a stable based was applied to the orthotic blank.   The blank was ground to a stable position for weight bearing. size 12 blue swirl base  blue med density EVA posting  none additional orthotic padding none  time 30 mins   running gait is now with knee lift and midfoot strike pretty good balance with some rotation of RT foot and hemipelvis suggest stride drills to balance this  Orders: Orthotic Materials, each unit (Z6109)  Problem # 2:  FOOT PAIN, BILATERAL (ICD-729.5)  can control this using orthotics  will add some std runner's exercises  reck prn  Orders: Orthotic Materials, each unit (L3002)  Complete Medication List: 1)  Adult Aspirin Ec Low Strength 81 Mg Tbec (Aspirin) .... Once daily 2)  Multivitamins Tabs (Multiple vitamin) .... Once daily 3)  Benicar 20  Mg Tabs (Olmesartan medoxomil) .[name] Kitchen.. 1 once daily 4)  Lipitor 20 Mg Tabs (Atorvastatin calcium) .... Take 1 tab by mouth daily 5)  Flomax 0.4 Mg Cp24 (Tamsulosin hcl) .... Take 1 capsule by mouth nightly 6)  Actoplus Met 15-500 Mg Tabs (Pioglitazone hcl-metformin hcl) .[name] Kitchen.. 1po bid 7)  Freestyle Test Strp (Glucose blood) .... Once daily prn 8)  Freestyle Lancets Misc (Lancets) .... Once daily prn 9)  Vitamin D3 1000 Unit Caps (Cholecalciferol) .... 2 daily  Patient Instructions: 1)  5 key exercises 2)  eccentric calf raises on a step 3)  knee straight gets gastroc 4)  knee bent gets soleus 5)  Internal rotation gets post tib 6)  drop squats 7)  runner's lunges 8)  hip abduction is a lateral leg lift 9)  standing hip rotation 10)  periodically work stride drills to where you feel like you are smooth, not crossing midline with arms or legs and relaxed 11)  the key phrase is relaxed, efficient , smooth   Orders Added: 1)  Est. Patient Level IV [60454] 2)  Orthotic Materials, each unit [L3002]

## 2010-09-09 ENCOUNTER — Encounter: Payer: Self-pay | Admitting: *Deleted

## 2010-11-10 ENCOUNTER — Telehealth: Payer: Self-pay | Admitting: Internal Medicine

## 2010-11-10 DIAGNOSIS — E119 Type 2 diabetes mellitus without complications: Secondary | ICD-10-CM

## 2010-11-10 DIAGNOSIS — E291 Testicular hypofunction: Secondary | ICD-10-CM

## 2010-11-10 DIAGNOSIS — R202 Paresthesia of skin: Secondary | ICD-10-CM

## 2010-11-10 NOTE — Telephone Encounter (Signed)
Needs labs. Entered.

## 2010-11-12 NOTE — Assessment & Plan Note (Signed)
[facility]                             PRIMARY CARE OFFICE NOTE   NAME:Kozakiewicz, HADY NIEMCZYK                     MRN:          [id number]  DATE:04/12/2006                            DOB:          02-07-1956    Dennie Bible is 50 now.  Comes for a physical.  There is a number of problems he is  having.  1. Right ear is stopped up.  2. Postprandial glucose runs in 140 to 160 range.  3. There is a cyst on the back of his neck, sometimes painful.  4. He develops problems with myalgias over past month or so.  They are      resolving after he held Lipitor.  5. Occasional prostatism.   PAST MEDICAL HISTORY:  1. Type 2 diabetes.  2. Asthma.  3. Sleep apnea on CPAP now.   ALLERGIES:  SPORANOX rash.  ACE INHIBITORS, SINUS SYMPTOMS AND COUGH.   CURRENT MEDICATIONS:  1. Actos 45 mg daily.  2. Glucophage 1000 mg twice daily.  3. Lipitor that he stopped.  4. Aspirin 81 mg daily.  5. Folic acid 1 mg daily.  6. Starlix 1 tablet as needed with larger meals.   REVIEW OF SYSTEMS:  Starlix makes sugar go down.  He has been tired at  times.  Myalgias as above.  Urinary problems at times.  The rest as above  were negative.   FAMILY HISTORY:  Father with memory loss.   SOCIAL HISTORY:  He continues to work a lot.   PHYSICAL EXAMINATION:  VITAL SIGNS:  Blood pressure 103/62, pulse 67,  temperature 99.3, weight 194 pounds.  GENERAL:  He looks well.  In no acute distress.  HEENT:  Both ears stopped up with wax.  NECK:  Supple, no thyromegaly.  1 cm mobile cyst posteriorly. No opening  visible.  LUNGS:  Clear.  HEART:  Regular S1, S2.  ABDOMEN:  Soft, nontender.  EXTREMITIES:  Lower extremities without edema.  Good peripheral pulses.  RECTAL/GENITOURINARY:  Prostate slightly enlarged, no masses, no nodules.  Stool guaiac negative.  Testicles normal without masses.  BACK:  Moles irregular color on the back.   LABORATORY DATA:  EKG normal sinus rhythm.  CBC with  hemoglobin 14.2,  platelets 143,000.  White count 4300.  Cholesterol 117, triglycerides 29,  HDL 42.9, LDL 68 (on Lipitor).  Microalbumin in the urine 5.6.  Elevated  glucose 217.  Otherwise CMET is normal.  CK 238, hemoglobin A1c 5.7.  Urinalysis normal. PSA 0.84.  TSH 0.73.   ASSESSMENT AND PLAN:  1. Normal wellness discussed.  He is maintained.  Repeat exam in 12      months.  He will schedule colonoscopy with Dr. Leone Payor.  He can stop      folic acid.  2. Elevated postprandial glucose.  In the view of his normal hemoglobin      A1c we really do not have to make any changes.  He can try to drop      Starlix and start Jenuvia 100 mg 1/2 daily instead.  Otherwise regimen  is unchanged.  Repeat labs in three months.  3. Myalgias.  He stopped Lipitor, improving.  When resolves completely, he      can start Vytorin 10/20 one half daily or every other day. Repeat labs      in three months.  4. Occasional prostatism symptoms.  Flomax 0.4 as needed.  5. Sebaceous cyst and moles.  He will make an appointment with a surgeon      to have those things removed.  6. Microalbuminuria.  Will try Benicar 20 mg 1/2 daily.  Repeat      microalbumin in three to six months.   Slightly decreased platelet and white cell counts could be related to  aspirin.  Will watch.            ______________________________  Georgina Quint. Plotnikov, MD      AVP/MedQ  DD:  04/12/2006  DT:  04/14/2006  Job #:  [id number]   cc:   Iva Boop, MD,FACG

## 2010-11-12 NOTE — Procedures (Signed)
NAME:  Timothy [name], Timothy [name] NO.:  [id number]   MEDICAL RECORD NO.:  [id number]          PATIENT TYPE:  OUT   LOCATION:  SLEEP CENTER                 FACILITY:  [facility]   PHYSICIAN:  Marcelyn Bruins, M.D. [facility] DATE OF BIRTH:  10-04-1955   DATE OF STUDY:  05/17/2004                              NOCTURNAL POLYSOMNOGRAM   REFERRING PHYSICIAN:  Dr. Solon Augusta Plotnikov.   INDICATION FOR THE STUDY:  Hypersomnia with sleep apnea.  Epworth sleepiness  score is 15.   SLEEP ARCHITECTURE:  The patient had a total sleep time of 431 minutes with  a sleep efficiency of 98%.  REM was slightly decreased and there was no slow  wave sleep obtained.  Sleep onset latency was fairly rapid at 4 minutes,  with REM onset being normal.   IMPRESSION:  1.  Mild obstructive sleep apnea with a respiratory disturbance index of 15      events per hour and desaturation as low as 85%.  The events were not      positional nor were they exclusively REM related.  2.  Moderate to loud snoring noted throughout the study.  3.  No clinically-significant cardiac arrhythmias.      KC/MEDQ  D:  05/19/2004 11:52:48  T:  05/19/2004 13:55:14  Job:  [id number]

## 2010-11-12 NOTE — Op Note (Signed)
[facility]  Patient:    Timothy [name], Timothy [name] Visit Number: [id number] MRN: [id number]          Service Type: DSU Location: [facility] Attending Physician:  Corie Chiquito Dictated by:   Margit Banda. Jearld Fenton, M.D. Proc. Date: 12/13/01 Admit Date:  12/13/2001 Discharge Date: 12/13/2001   CC:         Sonda Primes, M.D. [facility]   Operative Report  PREOPERATIVE DIAGNOSIS:  Deviated septum and turbinate hypertrophy.  POSTOPERATIVE DIAGNOSIS:  Deviated septum and turbinate hypertrophy.  SURGICAL PROCEDURE:  Septoplasty and submucosal resection of inferior turbinates.  ANESTHESIA:  General endotracheal anesthesia.  ESTIMATED BLOOD LOSS:  Less than 5 cc.  INDICATIONS:  This is a 55 year old who has had a chronic nasal obstruction and congestion that has been refractory to medical therapy.  He has tried all the nasal steroid sprays and decongestants and antihistamines and nothing has improved his nasal obstruction. He also has sinus episodes. He was informed of the risks and benefits of the procedure, including bleeding, infection, perforation, change in the external appearance of his nose, chronic crusting and drying, numbness of the teeth and risks of the anesthetic. All questions were answered and consent was obtained.  OPERATION:  The patient was taken to the operating room and placed in the supine position after adequate general endotracheal tube anesthesia. He was prepped and draped in the usual sterile manner. Oxymetazoline pledgets were placed into the nose bilaterally and the septum and inferior turbinates were injected with 1% lidocaine with 1:100,000 epinephrine.  A left hemitransfixion incision was performed, raising the mucoperichondrial and ostial flap. The cartilage was divided about 2 cm posterior to the caudal strut and this posterior cartilage was removed with the Therapist, nutritional. The posterior bone, which was fairly significantly deviated  was removed with the Laren Boom forceps. This corrected the septal deflection. There was an inferior spur that was removed with a 4 mm osteotome.  The turbinates were then infractured. A midline incision was made with a #15 blade and mucosal flap was elevated superiorly. The inferior mucosa and bone were removed with the turbinate scissors. The edge was cauterized with suction cautery. Both turbinates were outfractured with the Therapist, nutritional.  The hemitransfixion incision was closed with interrupted 4-0 chromic and a quilting 4-0 plain gut placed through the septum. The packs rolled, soaked in Bacitracin were placed into the nose bilaterally and secured with a 3-0 nylon. The oral cavity and oropharynx were suctioned out of all blood and debris under direct visualization.  The patient was awakened and brought to the recovery room in stable condition. Counts were correct. Dictated by:   Margit Banda. Jearld Fenton, M.D. Attending Physician:  Corie Chiquito DD:  12/13/01 TD:  12/14/01 Job: [id number]

## 2010-11-24 ENCOUNTER — Other Ambulatory Visit (INDEPENDENT_AMBULATORY_CARE_PROVIDER_SITE_OTHER): Payer: Commercial Managed Care - PPO

## 2010-11-24 ENCOUNTER — Other Ambulatory Visit (INDEPENDENT_AMBULATORY_CARE_PROVIDER_SITE_OTHER): Payer: Commercial Managed Care - PPO | Admitting: Internal Medicine

## 2010-11-24 DIAGNOSIS — R202 Paresthesia of skin: Secondary | ICD-10-CM

## 2010-11-24 DIAGNOSIS — Z0000 Encounter for general adult medical examination without abnormal findings: Secondary | ICD-10-CM

## 2010-11-24 DIAGNOSIS — E119 Type 2 diabetes mellitus without complications: Secondary | ICD-10-CM

## 2010-11-24 DIAGNOSIS — R209 Unspecified disturbances of skin sensation: Secondary | ICD-10-CM

## 2010-11-24 DIAGNOSIS — E291 Testicular hypofunction: Secondary | ICD-10-CM

## 2010-11-24 LAB — CBC WITH DIFFERENTIAL/PLATELET
Basophils Absolute: 0 10*3/uL (ref 0.0–0.1)
Basophils Relative: 0.5 % (ref 0.0–3.0)
Eosinophils Absolute: 0.1 10*3/uL (ref 0.0–0.7)
Hemoglobin: 14.7 g/dL (ref 13.0–17.0)
Lymphocytes Relative: 26 % (ref 12.0–46.0)
Monocytes Relative: 6.9 % (ref 3.0–12.0)
Neutro Abs: 3 10*3/uL (ref 1.4–7.7)
Neutrophils Relative %: 64.7 % (ref 43.0–77.0)
RBC: 4.48 Mil/uL (ref 4.22–5.81)

## 2010-11-24 LAB — COMPREHENSIVE METABOLIC PANEL
ALT: 34 U/L (ref 0–53)
AST: 32 U/L (ref 0–37)
Albumin: 4.1 g/dL (ref 3.5–5.2)
Alkaline Phosphatase: 46 U/L (ref 39–117)
Potassium: 4.6 mEq/L (ref 3.5–5.1)
Sodium: 139 mEq/L (ref 135–145)
Total Bilirubin: 0.9 mg/dL (ref 0.3–1.2)
Total Protein: 6.2 g/dL (ref 6.0–8.3)

## 2010-11-24 LAB — LIPID PANEL
LDL Cholesterol: 54 mg/dL (ref 0–99)
Total CHOL/HDL Ratio: 2
VLDL: 6 mg/dL (ref 0.0–40.0)

## 2010-11-24 LAB — MICROALBUMIN / CREATININE URINE RATIO: Microalb, Ur: 1.4 mg/dL (ref 0.0–1.9)

## 2010-11-24 LAB — URINALYSIS, ROUTINE W REFLEX MICROSCOPIC
Total Protein, Urine: NEGATIVE
Urine Glucose: NEGATIVE

## 2010-11-24 LAB — TESTOSTERONE: Testosterone: 311.51 ng/dL — ABNORMAL LOW (ref 350.00–890.00)

## 2010-11-24 LAB — TSH: TSH: 0.95 u[IU]/mL (ref 0.35–5.50)

## 2010-11-24 LAB — PSA: PSA: 1.83 ng/mL (ref 0.10–4.00)

## 2010-11-25 ENCOUNTER — Telehealth: Payer: Self-pay | Admitting: Internal Medicine

## 2010-11-25 NOTE — Telephone Encounter (Signed)
Please, mail the labs to Dr Delford Field Thx

## 2010-11-26 NOTE — Telephone Encounter (Signed)
done

## 2011-01-19 ENCOUNTER — Other Ambulatory Visit: Payer: Self-pay | Admitting: Internal Medicine

## 2011-03-06 ENCOUNTER — Encounter: Payer: Self-pay | Admitting: Critical Care Medicine

## 2011-03-06 NOTE — Progress Notes (Signed)
This encounter was created in error - please disregard.

## 2011-09-24 ENCOUNTER — Telehealth: Payer: Self-pay | Admitting: Internal Medicine

## 2011-09-24 ENCOUNTER — Telehealth: Payer: Self-pay | Admitting: Pulmonary Disease

## 2011-09-24 DIAGNOSIS — J40 Bronchitis, not specified as acute or chronic: Secondary | ICD-10-CM

## 2011-09-24 MED ORDER — AZITHROMYCIN 250 MG PO TABS: ORAL_TABLET | ORAL | Status: DC

## 2011-09-24 MED ORDER — AZITHROMYCIN 250 MG PO TABS: ORAL_TABLET | ORAL | Status: AC

## 2011-09-24 NOTE — Telephone Encounter (Signed)
z-pak for tracheobronchitis

## 2011-09-27 ENCOUNTER — Ambulatory Visit (INDEPENDENT_AMBULATORY_CARE_PROVIDER_SITE_OTHER)
Admission: RE | Admit: 2011-09-27 | Discharge: 2011-09-27 | Disposition: A | Discharge status: Home / Self Care | Payer: Commercial Managed Care - PPO | Source: Ambulatory Visit | Attending: Internal Medicine | Admitting: Internal Medicine

## 2011-09-27 ENCOUNTER — Telehealth: Payer: Self-pay | Admitting: Adult Health

## 2011-09-27 ENCOUNTER — Other Ambulatory Visit: Payer: Self-pay | Admitting: Internal Medicine

## 2011-09-27 DIAGNOSIS — R05 Cough: Secondary | ICD-10-CM

## 2011-09-27 MED ORDER — MOXIFLOXACIN HCL 400 MG PO TABS: 400.0000 mg | ORAL_TABLET | Freq: Every day | ORAL | Status: AC

## 2011-09-27 MED ORDER — HYDROCODONE-HOMATROPINE 5-1.5 MG/5ML PO SYRP: 5.0000 mL | ORAL_SOLUTION | Freq: Four times a day (QID) | ORAL | Status: AC | PRN

## 2011-09-27 NOTE — Telephone Encounter (Signed)
Complains of persistent cough and congestion  No improvement w/ Zpack  CXR today with bronchitic changes Avelox 400mg  daily x 7 days  Hydromet 1-2 tsp every 4-6 hr As needed  Cough- # 8oz  Pt aware  Please contact office for sooner follow up if symptoms do not improve or worsen or seek emergency care

## 2011-12-07 ENCOUNTER — Telehealth: Payer: Self-pay | Admitting: Adult Health

## 2011-12-07 ENCOUNTER — Other Ambulatory Visit (INDEPENDENT_AMBULATORY_CARE_PROVIDER_SITE_OTHER): Payer: Commercial Managed Care - PPO

## 2011-12-07 DIAGNOSIS — E119 Type 2 diabetes mellitus without complications: Secondary | ICD-10-CM

## 2011-12-07 NOTE — Telephone Encounter (Signed)
Needs a A1C repeated  Orders sent to lab

## 2012-05-11 ENCOUNTER — Telehealth: Payer: Self-pay | Admitting: Adult Health

## 2012-05-11 MED ORDER — CEPHALEXIN 500 MG PO CAPS: 500.0000 mg | ORAL_CAPSULE | Freq: Four times a day (QID) | ORAL | Status: DC

## 2012-05-11 NOTE — Telephone Encounter (Signed)
Pt complains of left great toe paronychia along the lateral nailbed/cuticle for few days.  No fever, drainage, no n/v.  Using otc abx ointment.  He is an avid runner w/ recent new running shoes.  No red streaking . +tenderness.  Hx of DM-well controlled.  Allergies -ACE   Advised on localized care w/ soap/water cleanses, warm soaks and dressing.  Rest w/ no running x 2 days and As needed    Begin Keflex 500mg  Four times a day  X 7 days  If not improving will need ov with PCP  Please contact office for sooner follow up if symptoms do not improve or worsen or seek emergency care

## 2012-12-07 ENCOUNTER — Ambulatory Visit (INDEPENDENT_AMBULATORY_CARE_PROVIDER_SITE_OTHER): Payer: Commercial Managed Care - PPO | Admitting: Sports Medicine

## 2012-12-07 DIAGNOSIS — M2240 Chondromalacia patellae, unspecified knee: Secondary | ICD-10-CM

## 2012-12-07 NOTE — Progress Notes (Signed)
  Subjective:    Patient ID: Timothy [name], male    DOB: December 24, 1955, 57 y.o.   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 He suffered a hyperflexion injury to the right knee about 6 weeks ago. Since then he has had anterior knee pain which is worse cycling as well as at the end of the day. He is an avid runner and is getting ready to train for the [place]. He has been able to run about 20 miles a week and running does not cause him any pain. He denies any swelling. No mechanical symptoms. 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 No prior knee surgeries. He has taken a single dose of ibuprofen 800 mg and it has been helpful.  Past medical history and current medications are reviewed He is allergic to ACE inhibitor     Review of Systems     Objective:   Physical Exam Well-developed, fit-appearing. No acute distress. Awake alert and oriented x3. Vital signs are reviewed  Right knee: Full range of motion. No effusion. There is some reproducible pain with patellar grind. No tenderness to palpation along the patella tendon. No joint line tenderness. Negative McMurray's. Negative Thessalys. Knee is stable to valgus and varus stressing. There is some slight laxity with Lachman's testing but a solid endpoint. PCL is intact. Neurovascularly intact distally. Walking without a limp.  MSK ultrasound of the right knee: No joint effusion is seen. There is slight hypoechoic changes seen along the underside of the right lateral patella as well as along the periphery of the lateral meniscus but I think these are incidental as clinically the patient does not fit this picture. There is no evidence of retropatellar bursitis.       Assessment & Plan:  1. 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 His history and exam fit more along the lines of chondromalacia patella. He is fine to continue running but I have recommended that he refrain from cycling for a while. Also recommended over-the-counter Aleve twice daily for the next 5 days and I've given him a body helix patellar strap to wear with activity. He is also instructed in a home exercise program consisting of isometric quad strengthening, partial squats, and hamstring strengthening. If symptoms persist we discussed the merits of a single cortisone injection to help calm things down. Otherwise, I think he can continue to increase activity as tolerated and will let me know if symptoms persist or worsen.

## 2013-06-11 ENCOUNTER — Telehealth: Payer: Self-pay | Admitting: Adult Health

## 2013-06-11 MED ORDER — AZITHROMYCIN 250 MG PO TABS: ORAL_TABLET | ORAL | Status: AC

## 2013-06-11 NOTE — Telephone Encounter (Signed)
Pt complains of sinus infection with thick green nasal discharge, sinus pain and pressure.  . Would like zpack .  Will follow up with PCP if not improving  Please contact office for sooner follow up if symptoms do not improve or worsen or seek emergency care

## 2013-08-18 ENCOUNTER — Other Ambulatory Visit: Payer: Self-pay | Admitting: Ophthalmology

## 2013-08-19 ENCOUNTER — Ambulatory Visit: Admit: 2013-08-19 | Payer: Self-pay | Admitting: Ophthalmology

## 2013-08-19 ENCOUNTER — Ambulatory Visit (HOSPITAL_COMMUNITY): Payer: 59

## 2013-08-19 ENCOUNTER — Encounter (HOSPITAL_COMMUNITY)
Admission: RE | Disposition: A | Discharge status: Home / Self Care | Payer: Self-pay | Source: Ambulatory Visit | Attending: Ophthalmology

## 2013-08-19 ENCOUNTER — Ambulatory Visit (HOSPITAL_COMMUNITY): Payer: 59 | Admitting: Certified Registered"

## 2013-08-19 ENCOUNTER — Encounter (HOSPITAL_COMMUNITY): Payer: 59 | Admitting: Certified Registered"

## 2013-08-19 ENCOUNTER — Ambulatory Visit (HOSPITAL_COMMUNITY)
Admission: RE | Admit: 2013-08-19 | Discharge: 2013-08-19 | Disposition: A | Discharge status: Home / Self Care | Payer: 59 | Source: Ambulatory Visit | Attending: Ophthalmology | Admitting: Ophthalmology

## 2013-08-19 ENCOUNTER — Encounter (HOSPITAL_COMMUNITY): Payer: Self-pay | Admitting: *Deleted

## 2013-08-19 DIAGNOSIS — E119 Type 2 diabetes mellitus without complications: Secondary | ICD-10-CM | POA: Insufficient documentation

## 2013-08-19 DIAGNOSIS — J45909 Unspecified asthma, uncomplicated: Secondary | ICD-10-CM | POA: Insufficient documentation

## 2013-08-19 DIAGNOSIS — H33009 Unspecified retinal detachment with retinal break, unspecified eye: Secondary | ICD-10-CM | POA: Insufficient documentation

## 2013-08-19 DIAGNOSIS — G4730 Sleep apnea, unspecified: Secondary | ICD-10-CM | POA: Insufficient documentation

## 2013-08-19 DIAGNOSIS — I10 Essential (primary) hypertension: Secondary | ICD-10-CM | POA: Insufficient documentation

## 2013-08-19 DIAGNOSIS — H33001 Unspecified retinal detachment with retinal break, right eye: Secondary | ICD-10-CM

## 2013-08-19 DIAGNOSIS — H5210 Myopia, unspecified eye: Secondary | ICD-10-CM | POA: Insufficient documentation

## 2013-08-19 HISTORY — DX: Disease of blood and blood-forming organs, unspecified: D75.9

## 2013-08-19 HISTORY — PX: GAS INSERTION: SHX5336

## 2013-08-19 HISTORY — DX: Gastro-esophageal reflux disease without esophagitis: K21.9

## 2013-08-19 HISTORY — PX: SCLERAL BUCKLE WITH CRYO: SHX5341

## 2013-08-19 HISTORY — DX: Sleep apnea, unspecified: G47.30

## 2013-08-19 HISTORY — DX: Type 2 diabetes mellitus without complications: E11.9

## 2013-08-19 LAB — CBC
HEMATOCRIT: 41.1 % (ref 39.0–52.0)
Hemoglobin: 14.6 g/dL (ref 13.0–17.0)
MCH: 31.9 pg (ref 26.0–34.0)
MCHC: 35.5 g/dL (ref 30.0–36.0)
MCV: 89.7 fL (ref 78.0–100.0)
Platelets: 125 10*3/uL — ABNORMAL LOW (ref 150–400)
RBC: 4.58 MIL/uL (ref 4.22–5.81)
RDW: 12.7 % (ref 11.5–15.5)
WBC: 5.8 10*3/uL (ref 4.0–10.5)

## 2013-08-19 LAB — BASIC METABOLIC PANEL
BUN: 26 mg/dL — ABNORMAL HIGH (ref 6–23)
CHLORIDE: 106 meq/L (ref 96–112)
CO2: 27 mEq/L (ref 19–32)
Calcium: 9.2 mg/dL (ref 8.4–10.5)
Creatinine, Ser: 1.18 mg/dL (ref 0.50–1.35)
GFR calc Af Amer: 77 mL/min — ABNORMAL LOW (ref >90)
GFR calc non Af Amer: 66 mL/min — ABNORMAL LOW (ref >90)
Glucose, Bld: 134 mg/dL — ABNORMAL HIGH (ref 70–99)
POTASSIUM: 4.3 meq/L (ref 3.7–5.3)
Sodium: 143 mEq/L (ref 137–147)

## 2013-08-19 LAB — GLUCOSE, CAPILLARY
Glucose-Capillary: 118 mg/dL — ABNORMAL HIGH (ref 70–99)
Glucose-Capillary: 141 mg/dL — ABNORMAL HIGH (ref 70–99)

## 2013-08-19 SURGERY — SCLERAL BUCKLE WITH CRYO
Anesthesia: General | Site: Eye | Laterality: Right

## 2013-08-19 SURGERY — SCLERAL BUCKLE WITH CRYO
Anesthesia: General | Laterality: Right

## 2013-08-19 MED ORDER — LACTATED RINGERS IV SOLN: INTRAVENOUS | Status: DC

## 2013-08-19 MED ORDER — ONDANSETRON HCL 4 MG/2ML IJ SOLN: 4.0000 mg | Freq: Once | INTRAMUSCULAR | Status: DC | PRN

## 2013-08-19 MED ORDER — GLYCOPYRROLATE 0.2 MG/ML IJ SOLN
INTRAMUSCULAR | Status: AC
  Filled 2013-08-19: qty 1

## 2013-08-19 MED ORDER — BSS IO SOLN
INTRAOCULAR | Status: DC | PRN
  Administered 2013-08-19 (×2): 15 mL via INTRAOCULAR

## 2013-08-19 MED ORDER — SODIUM CHLORIDE 0.9 % IJ SOLN
INTRAMUSCULAR | Status: DC | PRN
  Administered 2013-08-19: 12:00:00

## 2013-08-19 MED ORDER — NEOSTIGMINE METHYLSULFATE 1 MG/ML IJ SOLN
INTRAMUSCULAR | Status: AC
  Filled 2013-08-19: qty 10

## 2013-08-19 MED ORDER — CEFAZOLIN SODIUM 1-5 GM-% IV SOLN
INTRAVENOUS | Status: AC
  Administered 2013-08-19: 1 g via INTRAVENOUS
  Filled 2013-08-19: qty 50

## 2013-08-19 MED ORDER — MIDAZOLAM HCL 2 MG/2ML IJ SOLN
INTRAMUSCULAR | Status: AC
  Filled 2013-08-19: qty 2

## 2013-08-19 MED ORDER — PROPOFOL 10 MG/ML IV BOLUS
INTRAVENOUS | Status: DC | PRN
  Administered 2013-08-19: 140 mg via INTRAVENOUS

## 2013-08-19 MED ORDER — DEXAMETHASONE SODIUM PHOSPHATE 10 MG/ML IJ SOLN
INTRAMUSCULAR | Status: DC | PRN
  Administered 2013-08-19: 5 mg

## 2013-08-19 MED ORDER — PHENYLEPHRINE HCL 2.5 % OP SOLN
1.0000 [drp] | OPHTHALMIC | Status: AC | PRN
  Administered 2013-08-19 (×3): 1 [drp] via OPHTHALMIC
  Filled 2013-08-19: qty 15

## 2013-08-19 MED ORDER — GATIFLOXACIN 0.5 % OP SOLN
1.0000 [drp] | OPHTHALMIC | Status: AC | PRN
Start: 2013-08-19 — End: 2013-08-19
  Administered 2013-08-19 (×3): 1 [drp] via OPHTHALMIC
  Filled 2013-08-19: qty 2.5

## 2013-08-19 MED ORDER — DEXAMETHASONE SODIUM PHOSPHATE 10 MG/ML IJ SOLN
INTRAMUSCULAR | Status: AC
  Filled 2013-08-19: qty 1

## 2013-08-19 MED ORDER — ONDANSETRON HCL 4 MG/2ML IJ SOLN
INTRAMUSCULAR | Status: DC | PRN
  Administered 2013-08-19: 4 mg via INTRAVENOUS

## 2013-08-19 MED ORDER — LIDOCAINE HCL (CARDIAC) 20 MG/ML IV SOLN
INTRAVENOUS | Status: DC | PRN
  Administered 2013-08-19: 50 mg via INTRAVENOUS

## 2013-08-19 MED ORDER — BSS IO SOLN
INTRAOCULAR | Status: AC
  Filled 2013-08-19: qty 15

## 2013-08-19 MED ORDER — CYCLOPENTOLATE HCL 1 % OP SOLN
1.0000 [drp] | OPHTHALMIC | Status: AC | PRN
Start: 2013-08-19 — End: 2013-08-19
  Administered 2013-08-19 (×3): 1 [drp] via OPHTHALMIC
  Filled 2013-08-19 (×2): qty 2

## 2013-08-19 MED ORDER — FENTANYL CITRATE 0.05 MG/ML IJ SOLN
INTRAMUSCULAR | Status: DC | PRN
  Administered 2013-08-19: 150 ug via INTRAVENOUS

## 2013-08-19 MED ORDER — HYDROMORPHONE HCL PF 1 MG/ML IJ SOLN
INTRAMUSCULAR | Status: AC
  Filled 2013-08-19: qty 1

## 2013-08-19 MED ORDER — GENTAMICIN SULFATE 40 MG/ML IJ SOLN
INTRAMUSCULAR | Status: AC
  Filled 2013-08-19: qty 2

## 2013-08-19 MED ORDER — HYPROMELLOSE (GONIOSCOPIC) 2.5 % OP SOLN
OPHTHALMIC | Status: AC
  Filled 2013-08-19: qty 15

## 2013-08-19 MED ORDER — BUPIVACAINE HCL (PF) 0.75 % IJ SOLN
INTRAMUSCULAR | Status: AC
  Filled 2013-08-19: qty 10

## 2013-08-19 MED ORDER — SODIUM CHLORIDE 0.9 % IV SOLN
INTRAVENOUS | Status: DC
  Administered 2013-08-19 (×2): via INTRAVENOUS

## 2013-08-19 MED ORDER — PROPOFOL 10 MG/ML IV BOLUS
INTRAVENOUS | Status: AC
  Filled 2013-08-19: qty 20

## 2013-08-19 MED ORDER — HYDROMORPHONE HCL PF 1 MG/ML IJ SOLN
0.2500 mg | INTRAMUSCULAR | Status: DC | PRN
  Administered 2013-08-19 (×2): 0.25 mg via INTRAVENOUS

## 2013-08-19 MED ORDER — OXYCODONE HCL 5 MG PO TABS: 5.0000 mg | ORAL_TABLET | Freq: Once | ORAL | Status: DC | PRN

## 2013-08-19 MED ORDER — BUPIVACAINE HCL (PF) 0.75 % IJ SOLN
INTRAMUSCULAR | Status: DC | PRN
  Administered 2013-08-19: 10 mL

## 2013-08-19 MED ORDER — HYPROMELLOSE (GONIOSCOPIC) 2.5 % OP SOLN
OPHTHALMIC | Status: DC | PRN
  Administered 2013-08-19: 100 [drp] via OPHTHALMIC

## 2013-08-19 MED ORDER — ROCURONIUM BROMIDE 100 MG/10ML IV SOLN
INTRAVENOUS | Status: DC | PRN
  Administered 2013-08-19: 50 mg via INTRAVENOUS

## 2013-08-19 MED ORDER — POLYMYXIN B SULFATE 500000 UNITS IJ SOLR
INTRAMUSCULAR | Status: AC
  Filled 2013-08-19: qty 1

## 2013-08-19 MED ORDER — MIDAZOLAM HCL 5 MG/5ML IJ SOLN
INTRAMUSCULAR | Status: DC | PRN
  Administered 2013-08-19: 2 mg via INTRAVENOUS

## 2013-08-19 MED ORDER — SUCCINYLCHOLINE CHLORIDE 20 MG/ML IJ SOLN
INTRAMUSCULAR | Status: AC
  Filled 2013-08-19: qty 1

## 2013-08-19 MED ORDER — GLYCOPYRROLATE 0.2 MG/ML IJ SOLN
INTRAMUSCULAR | Status: DC | PRN
  Administered 2013-08-19: 0.2 mg via INTRAVENOUS
  Administered 2013-08-19: 0.4 mg via INTRAVENOUS
  Administered 2013-08-19: 0.2 mg via INTRAVENOUS

## 2013-08-19 MED ORDER — ONDANSETRON HCL 4 MG/2ML IJ SOLN
INTRAMUSCULAR | Status: AC
  Filled 2013-08-19: qty 2

## 2013-08-19 MED ORDER — LIDOCAINE HCL (CARDIAC) 20 MG/ML IV SOLN
INTRAVENOUS | Status: AC
  Filled 2013-08-19: qty 5

## 2013-08-19 MED ORDER — FENTANYL CITRATE 0.05 MG/ML IJ SOLN
INTRAMUSCULAR | Status: AC
  Filled 2013-08-19: qty 5

## 2013-08-19 MED ORDER — OXYCODONE HCL 5 MG/5ML PO SOLN: 5.0000 mg | Freq: Once | ORAL | Status: DC | PRN

## 2013-08-19 MED ORDER — SODIUM CHLORIDE 0.9 % IJ SOLN
INTRAMUSCULAR | Status: AC
  Filled 2013-08-19: qty 10

## 2013-08-19 MED ORDER — NEOSTIGMINE METHYLSULFATE 1 MG/ML IJ SOLN
INTRAMUSCULAR | Status: DC | PRN
  Administered 2013-08-19: 3 mg via INTRAVENOUS

## 2013-08-19 MED ORDER — GLYCOPYRROLATE 0.2 MG/ML IJ SOLN
INTRAMUSCULAR | Status: AC
  Filled 2013-08-19: qty 2

## 2013-08-19 SURGICAL SUPPLY — 48 items
APPLICATOR COTTON TIP 6IN STRL (MISCELLANEOUS) ×3 IMPLANT
APPLICATOR DR MATTHEWS STRL (MISCELLANEOUS) ×3 IMPLANT
CANNULA ANT CHAM MAIN (OPHTHALMIC RELATED) IMPLANT
COVER SURGICAL LIGHT HANDLE (MISCELLANEOUS) ×3 IMPLANT
DRAPE INCISE 51X51 W/FILM STRL (DRAPES) ×3 IMPLANT
DRAPE OPHTHALMIC 77X100 STRL (CUSTOM PROCEDURE TRAY) ×3 IMPLANT
FILTER BLUE MILLIPORE (MISCELLANEOUS) ×3 IMPLANT
FILTER STRAW FLUID ASPIR (MISCELLANEOUS) IMPLANT
GLOVE BIOGEL PI IND STRL 6.5 (GLOVE) ×1 IMPLANT
GLOVE BIOGEL PI INDICATOR 6.5 (GLOVE) ×2
GLOVE SS BIOGEL STRL SZ 8 (GLOVE) ×1 IMPLANT
GLOVE SUPERSENSE BIOGEL SZ 8 (GLOVE) ×2
GLOVE SURG SS PI 6.5 STRL IVOR (GLOVE) ×6 IMPLANT
GOWN STRL REUS W/ TWL LRG LVL3 (GOWN DISPOSABLE) ×2 IMPLANT
GOWN STRL REUS W/ TWL XL LVL3 (GOWN DISPOSABLE) ×1 IMPLANT
GOWN STRL REUS W/TWL LRG LVL3 (GOWN DISPOSABLE) ×4
GOWN STRL REUS W/TWL XL LVL3 (GOWN DISPOSABLE) ×2
IMPL SILICONE (Ophthalmic Related) ×1 IMPLANT
IMPLANT SILICONE (Ophthalmic Related) ×3 IMPLANT
ISPAN SULFUR HEXAFLUORIDE (SF6) ×3 IMPLANT
KIT BASIN OR (CUSTOM PROCEDURE TRAY) ×3 IMPLANT
KIT PERFLUORON PROCEDURE 5ML (MISCELLANEOUS) IMPLANT
LOW TEMPERATURE CAUTERY FINE TIP ×3 IMPLANT
MASK EYE SHIELD (GAUZE/BANDAGES/DRESSINGS) ×3 IMPLANT
NEEDLE 18GX1X1/2 (RX/OR ONLY) (NEEDLE) ×3 IMPLANT
NEEDLE 25GX 5/8IN NON SAFETY (NEEDLE) IMPLANT
NEEDLE HYPO 25GX1X1/2 BEV (NEEDLE) IMPLANT
NEEDLE HYPO 30X.5 LL (NEEDLE) ×3 IMPLANT
NS IRRIG 1000ML POUR BTL (IV SOLUTION) ×3 IMPLANT
PACK VITRECTOMY CUSTOM (CUSTOM PROCEDURE TRAY) IMPLANT
PAD ARMBOARD 7.5X6 YLW CONV (MISCELLANEOUS) ×3 IMPLANT
PAD EYE OVAL STERILE LF (GAUZE/BANDAGES/DRESSINGS) ×3 IMPLANT
PAK PIK VITRECTOMY CVS 25GA (OPHTHALMIC) IMPLANT
ROLLS DENTAL (MISCELLANEOUS) ×6 IMPLANT
SET FLUID INJECTOR (SET/KITS/TRAYS/PACK) IMPLANT
SLEEVE SCLERAL BUCK TYPE 70 (Ophthalmic Related) ×3 IMPLANT
SUT MERSILENE 5 0 RD 1 DA (SUTURE) ×6 IMPLANT
SUT SILK 2 0 (SUTURE) ×2
SUT SILK 2-0 18XBRD TIE 12 (SUTURE) ×1 IMPLANT
SUT VICRYL 7 0 TG140 8 (SUTURE) ×3 IMPLANT
SYR 20CC LL (SYRINGE) ×3 IMPLANT
SYR 5ML LL (SYRINGE) IMPLANT
TAPE SURG TRANSPORE 1 IN (GAUZE/BANDAGES/DRESSINGS) ×1 IMPLANT
TAPE SURGICAL TRANSPORE 1 IN (GAUZE/BANDAGES/DRESSINGS) ×2
TIRE 9 BICON SCLERAL TYPE 287 (Ophthalmic Related) ×3 IMPLANT
TOWEL OR 17X24 6PK STRL BLUE (TOWEL DISPOSABLE) ×6 IMPLANT
WATER STERILE IRR 1000ML POUR (IV SOLUTION) ×3 IMPLANT
WIPE INSTRUMENT VISIWIPE 73X73 (MISCELLANEOUS) ×3 IMPLANT

## 2013-08-19 NOTE — OR Nursing (Signed)
"  WARNING: Gas Bubble in Eye" lime green armband with ophthalmologist contact information applied to patient's wrist at the conclusion of the right eye scleral buckle with cryopexy procedure with Deloria Lair, MD. Alcon gas bubble information card with ophthalmologist contact information attached to the patient's hard copy chart and sent with him to the post anesthesia care unit.  Narda Rutherford, RN

## 2013-08-19 NOTE — H&P (Addendum)
Timothy [name] is an 58 y.o. male.   Chief Complaint: Visual field loss painless, right eye, less than 24 hours. HPI: 58 year old man with painless visual field loss onset morning of Aug 18, 2013.  Onset preceded by floaters for one week or so.  Inferonasal visual field loss right eye , with no changes in acuity thus far.  No past medical history on file.  No past surgical history on file. TONSILLECTOMY, ADENOIDECTOMY.  NASAL SEPTOPLASTY WITH DR SHOEMAKER  No family history on file. Social History:  has no tobacco, alcohol, and drug history on file.  Allergies:  Allergies  Allergen Reactions  . Ace Inhibitors     REACTION: COUGH    No prescriptions prior to admission    No results found for this or any previous visit (from the past 48 hour(s)). No results found.  Review of Systems  Constitutional: Negative.   HENT: Negative.   Eyes: Positive for blurred vision.  Respiratory: Negative.   Cardiovascular: Negative.   Gastrointestinal: Negative.   Genitourinary: Negative.   Musculoskeletal: Negative.   Skin: Negative.   Neurological: Negative.   Endo/Heme/Allergies: Negative.   Psychiatric/Behavioral: Negative.     There were no vitals taken for this visit. Physical Exam  Vitals reviewed. Constitutional: He is oriented to person, place, and time. He appears well-developed and well-nourished.  HENT:  Head: Normocephalic and atraumatic.  Eyes: Conjunctivae and EOM are normal. Pupils are equal, round, and reactive to light.    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  MACULA ON  Neck: Normal range of motion. Neck supple.  Cardiovascular: Normal rate and regular rhythm.   Respiratory: Effort normal.  GI: Soft.  Musculoskeletal: Normal range of motion.  Neurological: He is alert and oriented to person, place, and time. He has normal strength and normal reflexes.  Skin: Skin is warm, dry and intact.  Psychiatric: He has a  normal mood and affect. His speech is normal and behavior is normal. Judgment and thought content normal. Cognition and memory are normal.     Assessment/Plan 1.  Rhegmatogenous retinal detachment right eye, superotemporal, macula on.  2. 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  3.  Diabetes mellitus type 2    PLAN: Repair of retinal detachment right eye via scleral buckle with retinal cryopexy, under general anesthesia.    Dachelle Molzahn A 08/19/2013, 7:24 AM

## 2013-08-19 NOTE — Anesthesia Procedure Notes (Signed)
Procedure Name: Intubation Date/Time: 08/19/2013 12:26 PM Performed by: Melina Copa, Kinesha Auten R Pre-anesthesia Checklist: Patient identified, Emergency Drugs available, Suction available, Patient being monitored and Timeout performed Patient Re-evaluated:Patient Re-evaluated prior to inductionOxygen Delivery Method: Circle system utilized Preoxygenation: Pre-oxygenation with 100% oxygen Intubation Type: IV induction Ventilation: Mask ventilation without difficulty Laryngoscope Size: Mac and 4 Grade View: Grade II Tube type: Oral Tube size: 8.0 mm Number of attempts: 1 Airway Equipment and Method: Stylet Placement Confirmation: ETT inserted through vocal cords under direct vision,  positive ETCO2 and breath sounds checked- equal and bilateral Secured at: 21 cm Tube secured with: Tape Dental Injury: Teeth and Oropharynx as per pre-operative assessment

## 2013-08-19 NOTE — Brief Op Note (Signed)
08/19/2013  2:09 PM  PATIENT:  Timothy [name]  58 y.o. male  PRE-OPERATIVE DIAGNOSIS:  RETINAL DETACHMENT RIGHT EYE, rhegmatogenous, macula on, superior, with high axial myopia  POST-OPERATIVE DIAGNOSIS:  RETINAL DETACHMENT RIGHT EYE, ,,,,,same, with multiple breaks  PROCEDURE:  Procedure(s) with comments: SCLERAL BUCKLE WITH CRYOPEXY (Right) INSERTION OF GAS (Right) - SF6 100%,, 0.2 cc's  SURGEON:  Surgeon(s) and Role:    * Hurman Horn, MD - Primary    * Hurman Horn, MD - Primary  PHYSICIAN ASSISTANT:   ASSISTANTS: none   ANESTHESIA:   general  EBL:  Total I/O In: 500 [I.V.:500] Out: -   BLOOD ADMINISTERED:none  DRAINS: none   LOCAL MEDICATIONS USED:  MARCAINE   5 cc retrobulbar post op  SPECIMEN:  No Specimen  DISPOSITION OF SPECIMEN:  N/A  COUNTS:  YES  TOURNIQUET:  * No tourniquets in log *  DICTATION: .Other Dictation: Dictation Number [id number]  PLAN OF CARE: Discharge to home after PACU  PATIENT DISPOSITION:  PACU - hemodynamically stable.   Delay start of Pharmacological VTE agent (>24hrs) due to surgical blood loss or risk of bleeding: yes

## 2013-08-19 NOTE — Transfer of Care (Signed)
Immediate Anesthesia Transfer of Care Note  Patient: Timothy [name]  Procedure(s) Performed: Procedure(s) with comments: SCLERAL BUCKLE WITH CRYOPEXY (Right) INSERTION OF GAS (Right) - SF6  Patient Location: PACU  Anesthesia Type:General  Level of Consciousness: sedated  Airway & Oxygen Therapy: Patient Spontanous Breathing and Patient connected to nasal cannula oxygen  Post-op Assessment: Report given to PACU RN, Post -op Vital signs reviewed and stable and Patient moving all extremities  Post vital signs: Reviewed and stable  Complications: No apparent anesthesia complications

## 2013-08-19 NOTE — Preoperative (Signed)
Beta Blockers   Reason not to administer Beta Blockers:Not Applicable 

## 2013-08-19 NOTE — Anesthesia Postprocedure Evaluation (Signed)
  Anesthesia Post-op Note  Patient: Timothy [name]  Procedure(s) Performed: Procedure(s) with comments: SCLERAL BUCKLE WITH CRYOPEXY (Right) INSERTION OF GAS (Right) - SF6  Patient Location: PACU  Anesthesia Type:General  Level of Consciousness: awake, alert , oriented and patient cooperative  Airway and Oxygen Therapy: Patient Spontanous Breathing  Post-op Pain: mild  Post-op Assessment: Post-op Vital signs reviewed, Patient's Cardiovascular Status Stable, Respiratory Function Stable, Patent Airway, No signs of Nausea or vomiting and Pain level controlled  Post-op Vital Signs: stable  Complications: No apparent anesthesia complications

## 2013-08-19 NOTE — OR Nursing (Signed)
Deloria Lair, MD instructed that patient be positioned "upright or left side down" post operative right eye scleral buckle with cryopexy.  Narda Rutherford, RN

## 2013-08-19 NOTE — Discharge Instructions (Signed)
What to eat:  For your first meals, you should eat lightly; only small meals initially.  If you do not have nausea, you may eat larger meals.  Avoid spicy, greasy and heavy food.    General Anesthesia, Adult, Care After  Refer to this sheet in the next few weeks. These instructions provide you with information on caring for yourself after your procedure. Your health care provider may also give you more specific instructions. Your treatment has been planned according to current medical practices, but problems sometimes occur. Call your health care provider if you have any problems or questions after your procedure.  WHAT TO EXPECT AFTER THE PROCEDURE  After the procedure, it is typical to experience:  Sleepiness.  Nausea and vomiting. HOME CARE INSTRUCTIONS  For the first 24 hours after general anesthesia:  Have a responsible person with you.  Do not drive a car. If you are alone, do not take public transportation.  Do not drink alcohol.  Do not take medicine that has not been prescribed by your health care provider.  Do not sign important papers or make important decisions.  You may resume a normal diet and activities as directed by your health care provider.  Change bandages (dressings) as directed.  If you have questions or problems that seem related to general anesthesia, call the hospital and ask for the anesthetist or anesthesiologist on call. SEEK MEDICAL CARE IF:  You have nausea and vomiting that continue the day after anesthesia.  You develop a rash. SEEK IMMEDIATE MEDICAL CARE IF:  You have difficulty breathing.  You have chest pain.  You have any allergic problems. Document Released: 09/19/2000 Document Revised: 02/13/2013 Document Reviewed: 12/27/2012  [facility] Patient Information 2014 [place], [place].    Scleral Buckling  Scleral buckling is a procedure to repair a retinal detachment. A retinal detachment is when the retina layer pulls away from the inside surface of the  eye. Sometimes, this is an emergency procedure. During this procedure, a silicone rubber or silicone sponge "buckle" is placed on the outside of the eye and over the detachment inside the eye. A circling band may be positioned to indent the wall of the eye inward, which keeps the retina in place. Scleral buckling may be performed as an inpatient or outpatient procedure at a hospital or outpatient center. In some cases, you may need to stay in the hospital for 2 3 days. Most people can go home the same day. LET YOUR CAREGIVER KNOW ABOUT:  Any allergies.  All medicines taken, including vitamins, herbs, eyedrops, over-the-counter medicines, and creams.  Use of steroids.  Previous problems with anesthetics, including local anesthesia.  History of bleeding or blood problems.  Previous surgery.  Smoking history.  Any health problems. RISKS AND COMPLICATIONS  Reaction to anesthesia.  Damage to surrounding nerves, tissues, or structures.  Infection.  Scarring on the central part of the retina (macular pucker).  Bleeding under the retina.  Accumulation of fluid in the eye.  The outer wall of the eyeball may open (scleral perforation).  Cataract formation.  Glaucoma.  Fluid in the central part of the retina (cystoid macular edema).  Double vision.  Changes in eyesight.  Loss of vision. BEFORE THE PROCEDURE  You may need an eye exam.  You may need a physical exam and medical history review.  Ask your caregiver if you need to stop or change any regular medicines.  Avoid eating or drinking 7 to 8 hours before the procedure, or as directed.  Quit smoking, if you smoke.  Arrange for someone to drive you home after the procedure and for someone to help you at home afterwards. PROCEDURE  You will get eyedrops in your eye. The eyedrops will dilate your pupil. This allows your caregiver to see inside your eye.  You will be given a medicine to numb the eye (local  anesthetic) or a medicine to make you sleep (general anesthetic) during the procedure.  You may be asked to stay in a certain position until the procedure is done. The position you may need to stay in will depend on what portion of the retina is detached.  Your eyelashes may be clipped to help the area around the eye stay clean during the procedure.  A germ-free solution (antiseptic) will be applied to the area around the eye.  Your face will be covered with gauze to avoid contamination. An antibiotic solution may be put into your eye before the procedure to prevent an infection.  Incisions will be made to relax your eye. These incisions will also help your caregiver carefully move through muscles and other layers of the eye.  A buckle, with pre-positioned sutures, will be placed over the white outer layer of the eye called the "sclera." The buckle will be secured into place temporarily. After the buckle is adjusted into the final position, the sutures are tied.  The eye is flushed out and an antibiotic solution is applied to the eye.  All incisions are closed with sutures. Fluid may be drained from the eye that collected under the retinal detachment. The drainage site will be closed with sutures. Sometimes, draining is not done. AFTER THE PROCEDURE  Your eye will be covered with a bandage.  You may have some pain. You will be given medicine to control any discomfort.  Your pain may be controlled with cold packs.  You will need help with activities. Document Released: 12/13/2011 Document Reviewed: 12/13/2011 [facility] Patient Information 2014 [place], [place].  Retinal Detachment, Care After Refer to this sheet in the next few weeks. These instructions provide you with information on caring for yourself after your procedure. Your caregiver may also give you more specific instructions. Your treatment has been planned according to current medical practices, but problems sometimes occur.  Call your caregiver if you have any problems or questions after your procedure. HOME CARE INSTRUCTIONS   Avoid strenuous activities for several weeks or as directed.  Avoid activities in which you are bent over at the waist or your head is lower than your waist.  Avoid straining, such as from coughing, or with urinating or having a bowel movement. Use a stool softener if needed.  You develop new, unexplained problems or an increase of the problems which brought you to your caregiver.  If medications which kill germs (antibiotics) or eye drops were prescribed, take as directed and for as long as directed.  Keep appointments as directed.  Only take over-the-counter or prescription medicines for pain, discomfort, or fever as directed by your caregiver. SEEK MEDICAL CARE IF:   You have redness, swelling, or increasing discharge from the eye.  You have increased pain in the eye.  You have a fever.  You have changes in the vision of the affected eye.  You have a change in vision such as light flashes, floaters (light or dark spots), lines or holes or clouding of your vision. Document Released: 12/31/2004 Document Revised: 04/03/2013 Document Reviewed: 05/10/2007 [facility] Patient Information 2014 [place], [place].

## 2013-08-19 NOTE — Anesthesia Preprocedure Evaluation (Addendum)
Anesthesia Evaluation  Patient identified by MRN, date of birth, ID band Patient awake    Reviewed: Allergy & Precautions, H&P , NPO status , Patient's Chart, lab work & pertinent test results  Airway Mallampati: II TM Distance: >3 FB Neck ROM: Full    Dental  (+) Teeth Intact, Dental Advisory Given   Pulmonary asthma , sleep apnea ,  breath sounds clear to auscultation        Cardiovascular hypertension, Pt. on medications Rhythm:Regular Rate:Normal     Neuro/Psych    GI/Hepatic   Endo/Other  diabetes, Well Controlled, Type 2, Oral Hypoglycemic Agents  Renal/GU      Musculoskeletal   Abdominal   Peds  Hematology   Anesthesia Other Findings   Reproductive/Obstetrics                          Anesthesia Physical Anesthesia Plan  ASA: III  Anesthesia Plan: General   Post-op Pain Management:    Induction: Intravenous  Airway Management Planned: Oral ETT  Additional Equipment:   Intra-op Plan:   Post-operative Plan: Extubation in OR  Informed Consent: I have reviewed the patients History and Physical, chart, labs and discussed the procedure including the risks, benefits and alternatives for the proposed anesthesia with the patient or authorized representative who has indicated his/her understanding and acceptance.   Dental advisory given  Plan Discussed with: CRNA, Anesthesiologist and Surgeon  Anesthesia Plan Comments: (Type 2 DM glucose 118 Mild sleep apnea R. Retinal detachment)       Anesthesia Quick Evaluation

## 2013-08-20 ENCOUNTER — Encounter (HOSPITAL_COMMUNITY): Payer: Self-pay | Admitting: Ophthalmology

## 2013-08-20 NOTE — Op Note (Signed)
NAMEHILDA, Timothy [name]              ACCOUNT NO.:  [id number]  MEDICAL RECORD NO.:  [id number]  LOCATION:  [facility]                         FACILITY:  [place]  PHYSICIAN:  Clent Demark. Roselene Gray, M.D.   DATE OF BIRTH:  [birth year]  DATE OF PROCEDURE: DATE OF DISCHARGE:  08/19/2013                              OPERATIVE REPORT   PREOPERATIVE DIAGNOSIS: 1. Rhegmatogenous retinal detachment, right eye -- macula on. 2. Multiple retinal breaks, right eye. 3. Axial myopia, high myopia, right eye.  POSTOPERATIVE DIAGNOSES: 1. Rhegmatogenous retinal detachment, right eye -- macula on. 2. Multiple retinal breaks, right eye. 3. Axial myopia, high myopia, right eye. 4. Multiple breaks found with an extension detachment which had been     centered at the 10 o'clock position with a retinal break __________     with a half clock-hour on either side and now extended from the 10     o'clock __________ breaks at 11 and 12 o'clock positions with a     detach extending superiorly to 12 o'clock.  PROCEDURE: 1. Repair of rhegmatogenous retinal detachment, right eye via scleral     buckle, retinal cryopexy, external drainage of subretinal fluid,     right eye. 2. Injection of vitreous substitute -- __________ concentration volume     0.2 mL.  SURGEON:  Clent Demark. Estanislao Harmon, M.D.  ANESTHESIA:  General endotracheal anesthesia with Marcaine use, retrobulbar at the close of the case for postoperative analgesia.  INDICATION FOR PROCEDURE:  The patient is a 58 year old man who has progressive visual field loss and risk of profound vision loss of the right eye on the basis of spontaneous rhegmatogenous retinal detachment superiorly.  The patient understands this is an attempt to decrease chance of visual acuity loss and visual field progression loss.  He understands the risk of anesthesia, __________, loss of the eye from the underlying condition including but not limited to hemorrhage, infection, scarring, need for  another surgery, change in vision, loss of vision, progressive disease despite intervention.  Proper signed consent was obtained.  The patient was taken to the operating room.  In the operating room, appropriate monitors were followed by general anesthesia.  __________ without difficulty.  The right ocular region was identified.  A surgical time-out with the operating staff and surgeon.  Thereafter, sterile prep and drape of the usual ophthalmic fashion was carried out in the right eye and a second surgical time-out was carried out.  The conjunctival peritomy was then fashioned 360 degrees, approximately 1 mm posterior to the limbus __________ incision inferotemporally.  Rectus muscle __________ 2-0 silk ties.  Superotemporal quadrant was notable for significant adhesive Tenon scarring.  This was gently dissected without difficulty and hemostasis was obtained using blunt-tipped cautery. Rectus muscle was isolated on 2-0 silk ties.  Ophthalmoscopy was then performed.  Then, the previous retinal break found at 10 o'clock __________ on either side, and now extended from the 10 o'clock, break was intact but there was 11 o'clock break in 2nd and 3rd, 12 o'clock, break with the detachment extending up to the 12 o'clock region __________ extending posteriorly.  A 295 solid silicone explant was selected as well as 240 encircling band and 70  sleeve.  Retinal cryopexy was placed around the edges of the breaks.  Additional areas of lattice generation and atrophic hole were found at 6 o'clock, 5 o'clock, as well as at the 7 o'clock positions. These were treated independently and outside the bed of detachment.  No breaks were found or holes were found in the superonasal quadrant.  At this time, the 287 was placed underneath the lateral rectus and superior rectus muscles.  The 240 encircling band was then secured __________ inferior rectus muscle and also the medial rectus and secured temporarily  with a Watzke sleeve in the inferonasal quadrant.  A 5-0 Mersilene was then used to secure the band in the inferior quadrants __________.  At this time, temporary placement of 5-0 Mersilene were placed 2 in the superotemporal quadrant and 1 in the superonasal quadrant, near the medial aspect of the superior rectus.  At this time, ophthalmoscopy was then used to drain subretinal fluid in the bed of buckle superotemporally using a 26- gauge needle with direct observation technique.  All subretinal fluid was removed in this fashion.  __________ intraocular pressure was maintained using the 2-0 pre-placed sutures.  The buckle was now tied in a permanent fashion superotemporally and superiorly.  Excellent __________ obtained.  Band was then appropriately tightened as well. The ends of the band were trimmed.  At this time, indirect ophthalmoscopy confirmed that there was puckering through the break at the 10 o'clock position __________ superiorly well supported and flat on the buckle superiorly, superotemporally, the retinal break had a fold through the break and this required a sufficient __________ required placement of intravitreal gas.  After closure of the conjunctiva in a combined running __________ fashion, subconjunctival Decadron applied inferiorly.  __________ 0.2 mL injected in the vitreous cavity under direct observation with ophthalmoscope to assist postoperatively with closing the fishmouth break.  At this time, the retrobulbar injection of Marcaine 5 mL was then injected for postoperative analgesia purposes.  No complications occurred.  Intraocular pressure status found to be adequate.  Sterile patch and [name] were applied.  The patient tolerated the procedure well without complication, taken to the PACU.     Clent Demark Britainy Kozub, M.D.     GAR/MEDQ  D:  08/19/2013  T:  08/20/2013  Job:  [id number]  cc:   Delanna Ahmadi, M.D.

## 2014-01-15 ENCOUNTER — Encounter: Payer: Self-pay | Admitting: Sports Medicine

## 2014-01-15 ENCOUNTER — Ambulatory Visit (INDEPENDENT_AMBULATORY_CARE_PROVIDER_SITE_OTHER): Payer: 59 | Admitting: Sports Medicine

## 2014-01-15 VITALS — BP 121/76 | Ht 72.0 in | Wt 190.0 lb

## 2014-01-15 DIAGNOSIS — M9261 Juvenile osteochondrosis of tarsus, right ankle: Secondary | ICD-10-CM

## 2014-01-15 DIAGNOSIS — M928 Other specified juvenile osteochondrosis: Secondary | ICD-10-CM

## 2014-01-16 DIAGNOSIS — M9261 Juvenile osteochondrosis of tarsus, right ankle: Secondary | ICD-10-CM | POA: Insufficient documentation

## 2014-01-16 NOTE — Progress Notes (Signed)
   Subjective:    Patient ID: Timothy [name], male    DOB: [birth year], 58 y.o.   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 He has a history of plantar fasciitis but states that his current pain is different in nature than what he's experienced in the past. He describes an intermittent sharp stabbing discomfort that is in the posterior aspect of the right heel. It is sometimes present with running and at other times present with simply standing and walking. Again, it is intermittent. He states that he has some days where he is pain-free. He has been doing quite a bit of trail running and is wondering whether or not that may be contributing to his discomfort. He has noticed some swelling in the area as well. Pain at times were radiate down into the plantar aspect of his foot but again, he notes that this pain is different in nature than his previous plantar fascial pain. No recent injury. No numbness or tingling.  Interim medical history is reviewed. It is significant for a retinal detachment of his right eye treated by Dr.Rankin. He has been cleared by Dr.Rankin to resume running. Medications reviewed Allergies reviewed    Review of Systems    as above Objective:   Physical Exam Well-developed, well-nourished. No acute distress. Awake alert and oriented x3. Vital signs are reviewed.  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 He is tender to palpation here. No significant tenderness more proximally in the mid substance of the Achilles tendon. Negative calcaneal squeeze. No tenderness at the calcaneal insertion of the plantar fascia. Pes planus with standing. Walking with a slight limp.  Limited MSK ultrasound of the right heel was performed. There is a large area of calcification at the insertion of the Achilles tendon onto the calcaneus. This is seen in long and short views. There is  thickening of the Achilles tendon just prior to this calcification. Mid substance of the Achilles tendon is within normal limits. Plantar fascia is within normal limits. There is however a small calcaneal spur here.       Assessment & Plan:  Posterior right heel pain secondary to Haglund's deformity/insertional Achilles tendinopathy  Patient already has custom orthotics. I do think that the trail running may be exacerbating his symptoms. I recommended that he try running on a flat surface for the next few weeks to see if that changes his symptoms. I've given him a pair of 5/16 inch heel lifts to wear in his shoes and he will start the Alfredson heel drop protocol daily. Ice after running. He is okay to continue with activity as tolerated including running. We discussed starting are nitroglycerin protocol for Achilles tendinopathy if symptoms persist. Followup for ongoing or recalcitrant issues.

## 2014-04-08 ENCOUNTER — Other Ambulatory Visit (INDEPENDENT_AMBULATORY_CARE_PROVIDER_SITE_OTHER): Payer: Self-pay | Admitting: Surgery

## 2014-04-08 DIAGNOSIS — R2242 Localized swelling, mass and lump, left lower limb: Secondary | ICD-10-CM

## 2014-04-16 ENCOUNTER — Ambulatory Visit (INDEPENDENT_AMBULATORY_CARE_PROVIDER_SITE_OTHER): Payer: Self-pay | Admitting: Surgery

## 2014-04-18 ENCOUNTER — Other Ambulatory Visit: Payer: Self-pay

## 2014-04-24 ENCOUNTER — Other Ambulatory Visit (INDEPENDENT_AMBULATORY_CARE_PROVIDER_SITE_OTHER): Payer: Self-pay | Admitting: Surgery

## 2014-04-24 DIAGNOSIS — C4441 Basal cell carcinoma of skin of scalp and neck: Secondary | ICD-10-CM

## 2014-04-24 DIAGNOSIS — L858 Other specified epidermal thickening: Secondary | ICD-10-CM | POA: Insufficient documentation

## 2014-07-01 ENCOUNTER — Encounter (HOSPITAL_BASED_OUTPATIENT_CLINIC_OR_DEPARTMENT_OTHER): Payer: Self-pay | Admitting: *Deleted

## 2014-07-01 NOTE — Progress Notes (Signed)
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

## 2014-07-08 ENCOUNTER — Ambulatory Visit (HOSPITAL_BASED_OUTPATIENT_CLINIC_OR_DEPARTMENT_OTHER): Payer: 59 | Admitting: Anesthesiology

## 2014-07-08 ENCOUNTER — Ambulatory Visit (HOSPITAL_BASED_OUTPATIENT_CLINIC_OR_DEPARTMENT_OTHER)
Admission: RE | Admit: 2014-07-08 | Discharge: 2014-07-08 | Disposition: A | Discharge status: Home / Self Care | Payer: 59 | Source: Ambulatory Visit | Attending: Surgery | Admitting: Surgery

## 2014-07-08 ENCOUNTER — Encounter (HOSPITAL_BASED_OUTPATIENT_CLINIC_OR_DEPARTMENT_OTHER): Payer: Self-pay

## 2014-07-08 ENCOUNTER — Encounter (HOSPITAL_BASED_OUTPATIENT_CLINIC_OR_DEPARTMENT_OTHER)
Admission: RE | Disposition: A | Discharge status: Home / Self Care | Payer: Self-pay | Source: Ambulatory Visit | Attending: Surgery

## 2014-07-08 DIAGNOSIS — D485 Neoplasm of uncertain behavior of skin: Secondary | ICD-10-CM | POA: Diagnosis present

## 2014-07-08 DIAGNOSIS — Z7982 Long term (current) use of aspirin: Secondary | ICD-10-CM | POA: Diagnosis not present

## 2014-07-08 DIAGNOSIS — C4441 Basal cell carcinoma of skin of scalp and neck: Secondary | ICD-10-CM | POA: Diagnosis not present

## 2014-07-08 DIAGNOSIS — I10 Essential (primary) hypertension: Secondary | ICD-10-CM | POA: Diagnosis not present

## 2014-07-08 DIAGNOSIS — K219 Gastro-esophageal reflux disease without esophagitis: Secondary | ICD-10-CM | POA: Insufficient documentation

## 2014-07-08 DIAGNOSIS — Z888 Allergy status to other drugs, medicaments and biological substances status: Secondary | ICD-10-CM | POA: Diagnosis not present

## 2014-07-08 DIAGNOSIS — E119 Type 2 diabetes mellitus without complications: Secondary | ICD-10-CM | POA: Insufficient documentation

## 2014-07-08 DIAGNOSIS — G4730 Sleep apnea, unspecified: Secondary | ICD-10-CM | POA: Insufficient documentation

## 2014-07-08 DIAGNOSIS — L858 Other specified epidermal thickening: Secondary | ICD-10-CM | POA: Diagnosis present

## 2014-07-08 HISTORY — PX: MASS EXCISION: SHX2000

## 2014-07-08 HISTORY — DX: Presence of spectacles and contact lenses: Z97.3

## 2014-07-08 LAB — POCT I-STAT, CHEM 8
BUN: 17 mg/dL (ref 6–23)
Calcium, Ion: 1.19 mmol/L (ref 1.12–1.23)
Chloride: 103 mEq/L (ref 96–112)
Creatinine, Ser: 1.1 mg/dL (ref 0.50–1.35)
Glucose, Bld: 140 mg/dL — ABNORMAL HIGH (ref 70–99)
HEMATOCRIT: 43 % (ref 39.0–52.0)
Hemoglobin: 14.6 g/dL (ref 13.0–17.0)
Potassium: 3.9 mmol/L (ref 3.5–5.1)
Sodium: 141 mmol/L (ref 135–145)
TCO2: 21 mmol/L (ref 0–100)

## 2014-07-08 SURGERY — EXCISION MASS
Anesthesia: General | Site: Leg Lower | Laterality: Left

## 2014-07-08 MED ORDER — ONDANSETRON HCL 4 MG/2ML IJ SOLN
INTRAMUSCULAR | Status: DC | PRN
  Administered 2014-07-08: 4 mg via INTRAVENOUS

## 2014-07-08 MED ORDER — LIDOCAINE HCL (CARDIAC) 20 MG/ML IV SOLN
INTRAVENOUS | Status: DC | PRN
  Administered 2014-07-08: 50 mg via INTRAVENOUS

## 2014-07-08 MED ORDER — LIDOCAINE HCL (PF) 1 % IJ SOLN
INTRAMUSCULAR | Status: AC
  Filled 2014-07-08: qty 30

## 2014-07-08 MED ORDER — BUPIVACAINE-EPINEPHRINE (PF) 0.5% -1:200000 IJ SOLN
INTRAMUSCULAR | Status: AC
  Filled 2014-07-08: qty 30

## 2014-07-08 MED ORDER — GLYCOPYRROLATE 0.2 MG/ML IJ SOLN
INTRAMUSCULAR | Status: DC | PRN
  Administered 2014-07-08: 0.2 mg via INTRAVENOUS

## 2014-07-08 MED ORDER — OXYCODONE HCL 5 MG PO TABS: 5.0000 mg | ORAL_TABLET | Freq: Once | ORAL | Status: DC | PRN

## 2014-07-08 MED ORDER — FENTANYL CITRATE 0.05 MG/ML IJ SOLN: 50.0000 ug | INTRAMUSCULAR | Status: DC | PRN

## 2014-07-08 MED ORDER — OXYCODONE HCL 5 MG/5ML PO SOLN: 5.0000 mg | Freq: Once | ORAL | Status: DC | PRN

## 2014-07-08 MED ORDER — BUPIVACAINE-EPINEPHRINE (PF) 0.25% -1:200000 IJ SOLN
INTRAMUSCULAR | Status: AC
  Filled 2014-07-08: qty 30

## 2014-07-08 MED ORDER — ONDANSETRON HCL 4 MG/2ML IJ SOLN: 4.0000 mg | Freq: Once | INTRAMUSCULAR | Status: DC | PRN

## 2014-07-08 MED ORDER — CEFAZOLIN SODIUM-DEXTROSE 2-3 GM-% IV SOLR
INTRAVENOUS | Status: AC
  Filled 2014-07-08: qty 50

## 2014-07-08 MED ORDER — FENTANYL CITRATE 0.05 MG/ML IJ SOLN
INTRAMUSCULAR | Status: AC
  Filled 2014-07-08: qty 6

## 2014-07-08 MED ORDER — HYDROMORPHONE HCL 1 MG/ML IJ SOLN: 0.2500 mg | INTRAMUSCULAR | Status: DC | PRN

## 2014-07-08 MED ORDER — DEXAMETHASONE SODIUM PHOSPHATE 4 MG/ML IJ SOLN
INTRAMUSCULAR | Status: DC | PRN
  Administered 2014-07-08: 10 mg via INTRAVENOUS

## 2014-07-08 MED ORDER — FENTANYL CITRATE 0.05 MG/ML IJ SOLN
INTRAMUSCULAR | Status: DC | PRN
  Administered 2014-07-08 (×2): 50 ug via INTRAVENOUS

## 2014-07-08 MED ORDER — MIDAZOLAM HCL 2 MG/2ML IJ SOLN
INTRAMUSCULAR | Status: AC
  Filled 2014-07-08: qty 2

## 2014-07-08 MED ORDER — PROPOFOL 10 MG/ML IV BOLUS
INTRAVENOUS | Status: DC | PRN
  Administered 2014-07-08: 200 mg via INTRAVENOUS

## 2014-07-08 MED ORDER — MIDAZOLAM HCL 5 MG/5ML IJ SOLN
INTRAMUSCULAR | Status: DC | PRN
  Administered 2014-07-08: 2 mg via INTRAVENOUS

## 2014-07-08 MED ORDER — BUPIVACAINE HCL (PF) 0.5 % IJ SOLN
INTRAMUSCULAR | Status: AC
  Filled 2014-07-08: qty 30

## 2014-07-08 MED ORDER — LACTATED RINGERS IV SOLN
INTRAVENOUS | Status: DC
  Administered 2014-07-08: 07:00:00 via INTRAVENOUS
  Administered 2014-07-08: 10 mL/h via INTRAVENOUS

## 2014-07-08 MED ORDER — CEFAZOLIN SODIUM-DEXTROSE 2-3 GM-% IV SOLR: 2.0000 g | INTRAVENOUS | Status: DC

## 2014-07-08 MED ORDER — MIDAZOLAM HCL 2 MG/2ML IJ SOLN: 1.0000 mg | INTRAMUSCULAR | Status: DC | PRN

## 2014-07-08 MED ORDER — CEFAZOLIN SODIUM-DEXTROSE 2-3 GM-% IV SOLR
INTRAVENOUS | Status: DC | PRN
  Administered 2014-07-08: 2 g via INTRAVENOUS

## 2014-07-08 MED ORDER — OXYCODONE HCL 5 MG PO TABS: 5.0000 mg | ORAL_TABLET | ORAL | Status: DC | PRN

## 2014-07-08 MED ORDER — BUPIVACAINE HCL (PF) 0.5 % IJ SOLN
INTRAMUSCULAR | Status: DC | PRN
  Administered 2014-07-08: 16 mL

## 2014-07-08 SURGICAL SUPPLY — 39 items
BANDAGE ELASTIC 4 VELCRO ST LF (GAUZE/BANDAGES/DRESSINGS) ×2 IMPLANT
BENZOIN TINCTURE PRP APPL 2/3 (GAUZE/BANDAGES/DRESSINGS) ×2 IMPLANT
BLADE CLIPPER SURG (BLADE) ×2 IMPLANT
BLADE SURG 15 STRL LF DISP TIS (BLADE) ×1 IMPLANT
BLADE SURG 15 STRL SS (BLADE) ×1
CHLORAPREP W/TINT 26ML (MISCELLANEOUS) ×4 IMPLANT
CLEANER CAUTERY TIP 5X5 PAD (MISCELLANEOUS) IMPLANT
COVER BACK TABLE 60X90IN (DRAPES) ×2 IMPLANT
COVER MAYO STAND STRL (DRAPES) ×2 IMPLANT
DECANTER SPIKE VIAL GLASS SM (MISCELLANEOUS) IMPLANT
DRAPE PED LAPAROTOMY (DRAPES) IMPLANT
DRAPE UTILITY XL STRL (DRAPES) ×4 IMPLANT
DRSG TEGADERM 4X4.75 (GAUZE/BANDAGES/DRESSINGS) IMPLANT
ELECT REM PT RETURN 9FT ADLT (ELECTROSURGICAL) ×2
ELECTRODE REM PT RTRN 9FT ADLT (ELECTROSURGICAL) ×1 IMPLANT
GLOVE BIOGEL PI IND STRL 7.0 (GLOVE) ×1 IMPLANT
GLOVE BIOGEL PI INDICATOR 7.0 (GLOVE) ×1
GLOVE ECLIPSE 6.5 STRL STRAW (GLOVE) ×4 IMPLANT
GLOVE SURG ORTHO 8.0 STRL STRW (GLOVE) ×4 IMPLANT
GOWN STRL REUS W/ TWL LRG LVL3 (GOWN DISPOSABLE) ×1 IMPLANT
GOWN STRL REUS W/ TWL XL LVL3 (GOWN DISPOSABLE) ×2 IMPLANT
GOWN STRL REUS W/TWL LRG LVL3 (GOWN DISPOSABLE) ×1
GOWN STRL REUS W/TWL XL LVL3 (GOWN DISPOSABLE) ×2
LIQUID BAND (GAUZE/BANDAGES/DRESSINGS) ×2 IMPLANT
NEEDLE HYPO 25X1 1.5 SAFETY (NEEDLE) ×2 IMPLANT
PACK BASIN DAY SURGERY FS (CUSTOM PROCEDURE TRAY) ×2 IMPLANT
PAD CLEANER CAUTERY TIP 5X5 (MISCELLANEOUS)
PENCIL BUTTON HOLSTER BLD 10FT (ELECTRODE) ×2 IMPLANT
SHEET MEDIUM DRAPE 40X70 STRL (DRAPES) IMPLANT
SPONGE GAUZE 4X4 12PLY STER LF (GAUZE/BANDAGES/DRESSINGS) ×2 IMPLANT
STRIP CLOSURE SKIN 1/2X4 (GAUZE/BANDAGES/DRESSINGS) ×2 IMPLANT
SUT ETHILON 3 0 PS 1 (SUTURE) IMPLANT
SUT MON AB 4-0 PC3 18 (SUTURE) ×4 IMPLANT
SUT SILK 2 0 SH (SUTURE) ×2 IMPLANT
SUT VICRYL 3-0 CR8 SH (SUTURE) IMPLANT
SUT VICRYL 4-0 PS2 18IN ABS (SUTURE) IMPLANT
SYR CONTROL 10ML LL (SYRINGE) ×2 IMPLANT
TOWEL OR 17X24 6PK STRL BLUE (TOWEL DISPOSABLE) ×4 IMPLANT
TOWEL OR NON WOVEN STRL DISP B (DISPOSABLE) IMPLANT

## 2014-07-08 NOTE — Anesthesia Procedure Notes (Signed)
Procedure Name: LMA Insertion Date/Time: 07/08/2014 7:35 AM Performed by: Toula Moos L Pre-anesthesia Checklist: Patient identified, Emergency Drugs available, Suction available, Patient being monitored and Timeout performed Patient Re-evaluated:Patient Re-evaluated prior to inductionOxygen Delivery Method: Circle System Utilized Preoxygenation: Pre-oxygenation with 100% oxygen Intubation Type: IV induction Ventilation: Mask ventilation without difficulty LMA: LMA inserted LMA Size: 5.0 Number of attempts: 1 Airway Equipment and Method: bite block Placement Confirmation: positive ETCO2 Tube secured with: Tape Dental Injury: Teeth and Oropharynx as per pre-operative assessment

## 2014-07-08 NOTE — Op Note (Signed)
NAME:  Timothy [name], Timothy [name]              ACCOUNT NO.:  [id number]  MEDICAL RECORD NO.:  [id number]  LOCATION:                                 FACILITY:  PHYSICIAN:  Earnstine Regal, MD      DATE OF BIRTH:  [birth year]  DATE OF PROCEDURE:  07/08/2014                              OPERATIVE REPORT   PREOPERATIVE DIAGNOSES: 1. 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 2. Basal cell carcinoma posterior neck (1.0 cm).  PROCEDURE: 1. Re-excision of keratoacanthoma, left lower leg. 2. Re-excision of basal cell carcinoma posterior neck.  SURGEON:  Earnstine Regal, MD.  POSTOPERATIVE DIAGNOSES: 1. 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 2. Basal cell carcinoma posterior neck (1.0 cm).  ANESTHESIA:  General with local anesthetic (Marcaine).  BLOOD LOSS:  Minimal.  PREPARATION:  ChloraPrep.  COMPLICATIONS:  None.  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  The patient now comes to the operating room for re-excision of both lesions in order to obtain negative margins.  BODY OF REPORT:  Procedure was done in OR #8 at the [facility].  The patient was brought to the operating room, placed in supine position on the operating room table.  Following administration of general anesthesia, the patient was positioned and prepped and draped in the usual aseptic fashion.  After ascertaining that an adequate level of anesthesia had been achieved, the skin on the anterior left lower leg was anesthetized with local anesthetic.  Using a #15 blade, an elliptical incision was made, so as to encompass the entire previous scar with approximately a 1 mm margin.  Dissection was carried to full- thickness to the subcutaneous tissues and the lesion was excised using the electrocautery for  hemostasis.  Sutures were used to mark the margins and the lesion was submitted to Pathology for review.  The skin was reapproximated with interrupted 4-0 Monocryl subcuticular sutures. Wound was washed and dried and benzoin and Steri-Strips were applied. Sterile dressings were applied.  The patient has turned to a right lateral decubitus position.  The posterior neck was then prepped and draped in the usual aseptic fashion. Again using a #15 blade, an elliptical incision was made after infiltration of local Marcaine anesthetic.  The entire previous scar was excised with approximately a 1 mm margin of normal skin.  Dissection was carried to full-thickness into the subcutaneous tissues and hemostasis achieved with the electrocautery.  The entire lesion was excised, marked with sutures for margins, and submitted to Pathology for review.  Skin was reapproximated with interrupted 4-0 Monocryl subcuticular sutures.  Good hemostasis was noted.  Dermabond was placed as dressing.  The patient was awakened from anesthesia and brought to the recovery room in stable condition.  The patient tolerated the procedure well.   Earnstine Regal, MD, [facility] Office: [phone]    TMG/MEDQ  D:  07/08/2014  T:  07/08/2014  Job:  [id number]  cc:   Albertina Parr A. Aldona Lento, M.D. Marshall Cork.  Laurann Montana, M.D.

## 2014-07-08 NOTE — Anesthesia Preprocedure Evaluation (Addendum)
Anesthesia Evaluation  Patient identified by MRN, date of birth, ID band Patient awake    Airway Mallampati: I  TM Distance: >3 FB Neck ROM: Full    Dental  (+) Teeth Intact, Dental Advisory Given   Pulmonary  breath sounds clear to auscultation        Cardiovascular Rhythm:Regular Rate:Normal     Neuro/Psych    GI/Hepatic GERD-  Medicated and Controlled,  Endo/Other  diabetes, Well Controlled, Type 2, Oral Hypoglycemic Agents  Renal/GU      Musculoskeletal   Abdominal   Peds  Hematology   Anesthesia Other Findings   Reproductive/Obstetrics                            Anesthesia Physical Anesthesia Plan  ASA: II  Anesthesia Plan: General   Post-op Pain Management:    Induction: Intravenous  Airway Management Planned: LMA  Additional Equipment:   Intra-op Plan:   Post-operative Plan: Extubation in OR  Informed Consent: I have reviewed the patients History and Physical, chart, labs and discussed the procedure including the risks, benefits and alternatives for the proposed anesthesia with the patient or authorized representative who has indicated his/her understanding and acceptance.   Dental advisory given  Plan Discussed with: CRNA, Anesthesiologist and Surgeon  Anesthesia Plan Comments:         Anesthesia Quick Evaluation

## 2014-07-08 NOTE — Discharge Instructions (Signed)

## 2014-07-08 NOTE — Transfer of Care (Signed)
Immediate Anesthesia Transfer of Care Note  Patient: Timothy [name]  Procedure(s) Performed: Procedure(s) with comments: EXCISION OF KERATOACANTHOMA LEFT LOWER LEG, AND EXCISION OF BASIL CELL CARCINOMA FROM NECK (Left) - left lower leg and posterior neck  Patient Location: PACU  Anesthesia Type:General  Level of Consciousness: sedated and patient cooperative  Airway & Oxygen Therapy: Patient Spontanous Breathing and Patient connected to face mask oxygen  Post-op Assessment: Report given to PACU RN and Post -op Vital signs reviewed and stable  Post vital signs: Reviewed and stable  Complications: No apparent anesthesia complications

## 2014-07-08 NOTE — Brief Op Note (Signed)
07/08/2014  8:42 AM  PATIENT:  Timothy [name]  59 y.o. male  PRE-OPERATIVE DIAGNOSIS:  KERATOACANTHOMA AND BASEL CELL CA  POST-OPERATIVE DIAGNOSIS:  KERATOACANTHOMA AND BASEL CELL CA  PROCEDURE:  Procedure(s) with comments: EXCISION OF KERATOACANTHOMA LEFT LOWER LEG, AND EXCISION OF BASIL CELL CARCINOMA FROM NECK (Left) - left lower leg and posterior neck  SURGEON:  Surgeon(s) and Role:    * Armandina Gemma, MD - Primary  ANESTHESIA:   general  EBL:  Total I/O In: 900 [I.V.:900] Out: -   BLOOD ADMINISTERED:none  DRAINS: none   LOCAL MEDICATIONS USED:  MARCAINE     SPECIMEN:  Excision  DISPOSITION OF SPECIMEN:  PATHOLOGY  COUNTS:  YES  TOURNIQUET:  * No tourniquets in log *  DICTATION: .Other Dictation: Dictation Number [id number]  PLAN OF CARE: Discharge to home after PACU  PATIENT DISPOSITION:  PACU - hemodynamically stable.   Delay start of Pharmacological VTE agent (>24hrs) due to surgical blood loss or risk of bleeding: yes  Earnstine Regal, MD, [facility] Office: [phone]

## 2014-07-08 NOTE — Anesthesia Postprocedure Evaluation (Signed)
  Anesthesia Post-op Note  Patient: Timothy [name]  Procedure(s) Performed: Procedure(s) with comments: EXCISION OF KERATOACANTHOMA LEFT LOWER LEG, AND EXCISION OF BASIL CELL CARCINOMA FROM NECK (Left) - left lower leg and posterior neck  Patient Location: PACU  Anesthesia Type: General   Level of Consciousness: awake, alert  and oriented  Airway and Oxygen Therapy: Patient Spontanous Breathing  Post-op Pain: none   Post-op Assessment: Post-op Vital signs reviewed  Post-op Vital Signs: Reviewed  Last Vitals:  Filed Vitals:   07/08/14 0936  BP: 114/56  Pulse: 55  Temp: 36.5 C  Resp: 20    Complications: No apparent anesthesia complications

## 2014-07-08 NOTE — H&P (Signed)
Timothy [name] is an 59 y.o. male.    General Surgery [facility]  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  Presents today for excision of both under anesthesia.  Past Medical History  Diagnosis Date  . Hypertension   . Diabetes mellitus without complication   . Sleep apnea     no longer has to use CPAP wt. loss  . GERD (gastroesophageal reflux disease)     otc pepcid 10 mg otc  . Blood dyscrasia     platlet count tends to run low  . Wears glasses     Past Surgical History  Procedure Laterality Date  . Nasal setoplasty    . Tonsillectomy    . Scleral buckle with cryo Right 08/19/2013    Procedure: SCLERAL BUCKLE WITH CRYOPEXY;  Surgeon: Hurman Horn, MD;  Location: [place];  Service: Ophthalmology;  Laterality: Right;  . Gas insertion Right 08/19/2013    Procedure: INSERTION OF GAS;  Surgeon: Hurman Horn, MD;  Location: [place];  Service: Ophthalmology;  Laterality: Right;  SF6  . Colonoscopy      History reviewed. No pertinent family history. Social History:  reports that he has never smoked. He does not have any smokeless tobacco history on file. He reports that he drinks about 1.2 oz of alcohol per week. He reports that he does not use illicit drugs.  Allergies:  Allergies  Allergen Reactions  . Sporanox [Itraconazole] Hives  . Ace Inhibitors     REACTION: COUGH    Medications Prior to Admission  Medication Sig Dispense Refill  . aspirin (ADULT ASPIRIN EC LOW STRENGTH) 81 MG EC tablet Take 81 mg by mouth daily.      [name] Kitchen atorvastatin (LIPITOR) 20 MG tablet Take 10 mg by mouth daily.     . Cholecalciferol (VITAMIN D3) 1000 UNITS CAPS Take 2 capsules by mouth daily.     . famotidine (PEPCID) 20 MG tablet Take 20 mg by mouth as needed for heartburn or indigestion.    [name] Kitchen glucose blood (FREESTYLE  TEST STRIPS) test strip 1 each daily as needed.      . Lancets (FREESTYLE) lancets 1 each daily as needed.      . metFORMIN (GLUCOPHAGE) 500 MG tablet Take 1,000 mg by mouth daily with breakfast.    . Multiple Vitamin (MULTIVITAMINS PO) daily.       Results for orders placed or performed during the hospital encounter of 07/08/14 (from the past 48 hour(s))  I-STAT, chem 8     Status: Abnormal   Collection Time: 07/08/14  7:09 AM  Result Value Ref Range   Sodium 141 135 - 145 mmol/L   Potassium 3.9 3.5 - 5.1 mmol/L   Chloride 103 96 - 112 mEq/L   BUN 17 6 - 23 mg/dL   Creatinine, Ser 1.10 0.50 - 1.35 mg/dL   Glucose, Bld 140 (H) 70 - 99 mg/dL   Calcium, Ion 1.19 1.12 - 1.23 mmol/L   TCO2 21 0 - 100 mmol/L   Hemoglobin 14.6 13.0 - 17.0 g/dL   HCT 43.0 39.0 - 52.0 %   No results found.  Review of Systems  Constitutional: Negative.   HENT: Negative.   Eyes: Negative.   Respiratory: Negative.   Cardiovascular: Negative.   Gastrointestinal: Positive for heartburn.  Genitourinary: Negative.  Musculoskeletal: Negative.   Skin:       Lesion left lower leg and posterior neck for excision  Neurological: Negative.   Endo/Heme/Allergies: Negative.   Psychiatric/Behavioral: Negative.     Blood pressure 106/57, pulse 50, temperature 97.6 F (36.4 C), resp. rate 20, height 6' (1.829 m), weight 193 lb (87.544 kg), SpO2 100 %. Physical Exam  Constitutional: He is oriented to person, place, and time. He appears well-developed and well-nourished. No distress.  HENT:  Head: Normocephalic and atraumatic.  Right Ear: External ear normal.  Left Ear: External ear normal.  Eyes: Conjunctivae are normal. Pupils are equal, round, and reactive to light.  Neck: Normal range of motion. Neck supple. No tracheal deviation present. No thyromegaly present.  1.5 cm scar midline posterior neck  Cardiovascular: Normal rate, regular rhythm and normal heart sounds.   Respiratory: Effort normal and breath  sounds normal. He has no wheezes.  GI: Soft. Bowel sounds are normal. He exhibits no distension.  Musculoskeletal: Normal range of motion. He exhibits no edema.  1.0 cm scar anterior left lower leg  Neurological: He is alert and oriented to person, place, and time.  Skin: Skin is warm and dry.  Psychiatric: He has a normal mood and affect. His behavior is normal.     Assessment/Plan 1. Keratoacanthoma left lower leg 2. Basal cell carcinoma posterior neck  The risks and benefits of the procedure have been discussed at length with the patient.  The patient understands the proposed procedure, potential alternative treatments, and the course of recovery to be expected.  All of the patient's questions have been answered at this time.  The patient wishes to proceed with surgery.  Earnstine Regal, MD, [facility] Office: [place] 07/08/2014, 7:23 AM

## 2014-07-09 ENCOUNTER — Encounter (HOSPITAL_BASED_OUTPATIENT_CLINIC_OR_DEPARTMENT_OTHER): Payer: Self-pay | Admitting: Surgery

## 2014-09-10 ENCOUNTER — Ambulatory Visit (INDEPENDENT_AMBULATORY_CARE_PROVIDER_SITE_OTHER)
Admission: RE | Admit: 2014-09-10 | Discharge: 2014-09-10 | Disposition: A | Discharge status: Home / Self Care | Payer: 59 | Source: Ambulatory Visit | Attending: Internal Medicine | Admitting: Internal Medicine

## 2014-09-10 ENCOUNTER — Other Ambulatory Visit: Payer: Self-pay | Admitting: Internal Medicine

## 2014-09-10 ENCOUNTER — Telehealth: Payer: Self-pay

## 2014-09-10 DIAGNOSIS — R05 Cough: Secondary | ICD-10-CM

## 2014-09-10 DIAGNOSIS — R059 Cough, unspecified: Secondary | ICD-10-CM

## 2014-09-10 NOTE — Telephone Encounter (Signed)
09/10/14 today's released chest x-rays copied on disc to [facility] for Dr. Joya Gaskins.

## 2014-09-26 ENCOUNTER — Other Ambulatory Visit: Payer: Self-pay | Admitting: Internal Medicine

## 2014-09-26 ENCOUNTER — Ambulatory Visit
Admission: RE | Admit: 2014-09-26 | Discharge: 2014-09-26 | Disposition: A | Discharge status: Home / Self Care | Payer: 59 | Source: Ambulatory Visit | Attending: Internal Medicine | Admitting: Internal Medicine

## 2014-09-26 DIAGNOSIS — R059 Cough, unspecified: Secondary | ICD-10-CM

## 2014-09-26 DIAGNOSIS — R05 Cough: Secondary | ICD-10-CM

## 2014-10-07 ENCOUNTER — Encounter: Payer: Self-pay | Admitting: Internal Medicine

## 2014-10-30 ENCOUNTER — Ambulatory Visit (HOSPITAL_BASED_OUTPATIENT_CLINIC_OR_DEPARTMENT_OTHER): Payer: 59 | Attending: Internal Medicine | Admitting: *Deleted

## 2014-10-30 VITALS — Ht 72.0 in | Wt 185.0 lb

## 2014-10-30 DIAGNOSIS — R0683 Snoring: Secondary | ICD-10-CM | POA: Diagnosis not present

## 2014-10-30 DIAGNOSIS — G4733 Obstructive sleep apnea (adult) (pediatric): Secondary | ICD-10-CM | POA: Diagnosis present

## 2014-10-30 DIAGNOSIS — G4730 Sleep apnea, unspecified: Secondary | ICD-10-CM

## 2014-11-05 DIAGNOSIS — G4733 Obstructive sleep apnea (adult) (pediatric): Secondary | ICD-10-CM | POA: Diagnosis not present

## 2014-11-05 NOTE — Sleep Study (Signed)
[place]   NAME: Timothy [name]  DATE OF BIRTH: Nov 24, 1955  MEDICAL RECORD [id number]  LOCATION: [facility]   PHYSICIAN: Vernia Teem V.   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 PSG in 2005 showed RDI of 15 per hour with lowest desaturation of 85%. He lost significant weight and was able to come off CPAP. This study was performed to reevaluate non-refreshing sleep. At the time of this study ,they weighed 190 pounds with a height of 6 ft 1 inches and the BMI of 24, neck size of 16 inches. Epworth sleepiness score was 1   This nocturnal polysomnogram was performed with a sleep technologist in attendance. EEG, EOG,EMG and respiratory parameters recorded. Sleep stages, arousals, limb movements and respiratory data was scored according to criteria laid out by the American Academy of sleep medicine.   SLEEP ARCHITECTURE: Lights out was at 2311 PM and lights on was at 548 AM. Total sleep time was 340 minutes with a sleep period time of 346 minutes and a sleep efficiency of 93 %. Sleep latency was 19 minutes with latency to REM sleep of 66 minutes and wake after sleep onset of 7 minutes. . Sleep stages as a percentage of total sleep time was N1 -5.7 %,N2- 72 % and REM sleep 22 % ( 74 minutes) . The longest period of REM sleep was around 5 AM.   AROUSAL DATA : There were 66  arousals with an arousal index of 11.6 events per hour. Most of these were spontaneous & 9 were associated with respiratory events  RESPIRATORY DATA: There were 0 obstructive apneas, 0 central apneas, 0 mixed apneas and 27 hypopneas with apnea -hypopnea index of 4.8 events per hour. There were 9 RERAs with an RDI of 6.4 events per hour. Supine sleep was noted and all events were during supine  sleep.  MOVEMENT/PARASOMNIA: There were 8 PLMS with a PLM index of 1.4 events per hour. The PLM arousal index was 0 per hour.  OXYGEN DATA: The lowest desaturation was 87 % during nREM sleep and the desaturation index was 4.8 per hour.   CARDIAC DATA: The low heart rate was 33 beats per minute. The high heart rate recorded was an artifact. No arrhythmias were noted   DISCUSSION -moderate snoring was noted . He did not meet criteria for CPAP intervention.   IMPRESSION :  1. Mild obstructive sleep apnea with hypopneas causing sleep fragmentation and mild oxygen desaturation. This is improved significantly with weight loss and no intervention is recommended at this time. 2. No evidence of cardiac arrhythmias,periodic limb movements or behavioral disturbance during sleep.  3. Sleep efficiency was excellent  RECOMMENDATION:  1. No intervention required. Avoid sleeping in supine position to avoid excessive snoring. 2. He should be cautioned against driving when sleepy  3. They should be asked to avoid medications with sedative side effects    Rigoberto Noel MD Diplomate, American Board of Sleep Medicine    ELECTRONICALLY SIGNED ON: 11/05/2014  [facility]  PH: [phone] FX: [phone]  [place]

## 2014-11-11 HISTORY — PX: PROSTATE BIOPSY: SHX241

## 2014-12-15 ENCOUNTER — Encounter: Payer: Self-pay | Admitting: Radiation Oncology

## 2014-12-15 NOTE — Progress Notes (Signed)
[facility] Location of Tumor / Histology: Adenocarcinoma of the Prostate   Elsie Stain presented:  If Prostate Cancer, Gleason Score is (3 + 3) and PSA is (3.57) : 3 of 12 cores positive - left lateral apex(20%), right lateral apex (20%, PNI), right lateral mid(40%)  Past/Anticipated interventions by urology, if any: Biopsy of Prostate  Past/Anticipated interventions by medical oncology, if any: None  Weight changes, if any: no  Bowel/Bladder complaints, if any: no   Nausea/Vomiting, if any: no  Pain issues, if any:  no  SAFETY ISSUES:  Prior radiation? No  Pacemaker/ICD? No  Possible current pregnancy? N/A  Is the patient on methotrexate? No  Current Complaints / other details: Patient is here with his wife.  His IPSS score today is 13.   Mild to Moderate Erectile dysfunction SHIM score 12

## 2014-12-18 ENCOUNTER — Encounter: Payer: Self-pay | Admitting: Radiation Oncology

## 2014-12-18 ENCOUNTER — Ambulatory Visit
Admission: RE | Admit: 2014-12-18 | Discharge: 2014-12-18 | Disposition: A | Discharge status: Home / Self Care | Payer: 59 | Source: Ambulatory Visit | Attending: Radiation Oncology | Admitting: Radiation Oncology

## 2014-12-18 VITALS — BP 115/63 | HR 57 | Temp 98.3°F | Resp 16 | Ht 72.0 in | Wt 191.9 lb

## 2014-12-18 DIAGNOSIS — Z79899 Other long term (current) drug therapy: Secondary | ICD-10-CM | POA: Diagnosis not present

## 2014-12-18 DIAGNOSIS — I10 Essential (primary) hypertension: Secondary | ICD-10-CM | POA: Diagnosis not present

## 2014-12-18 DIAGNOSIS — C61 Malignant neoplasm of prostate: Secondary | ICD-10-CM | POA: Diagnosis not present

## 2014-12-18 DIAGNOSIS — E119 Type 2 diabetes mellitus without complications: Secondary | ICD-10-CM | POA: Diagnosis not present

## 2014-12-18 DIAGNOSIS — Z7982 Long term (current) use of aspirin: Secondary | ICD-10-CM | POA: Diagnosis not present

## 2014-12-18 HISTORY — DX: Malignant neoplasm of prostate: C61

## 2014-12-18 NOTE — Progress Notes (Signed)
Please see the Nurse Progress Note in the MD Initial Consult Encounter for this patient. 

## 2014-12-18 NOTE — Progress Notes (Addendum)
[facility] NEW PATIENT EVALUATION  Name: Timothy [name] MRN: [id number]  Date:   12/18/2014           DOB: [birth year]  Status: outpatient   CC:   Timothy Bring, MD , Dr. 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 male who is seen today through the courtesy Dr. Alinda [name] for discussion of radiation therapy options in the management of his stageT1c favorable risk adenocarcinoma prostate.  He presented with a rising PSA.  His PSA was 1.79 in May 2014 rising to 2.48 by March 2015, 5.22 by March 2016, with a repeat value 3.57 on 10/23/2014.  He was seen by Dr. Alinda [name] and he underwent ultrasound-guided biopsies on 11/11/2014.  He was found have Gleason 6 (3+3) involving 40% of one core from right lateral mid gland, 20% of one core from right lateral apex, and 20% of one core from left lateral apex.  His gland volume was 29.5 mL.  Of note is that his I PSS score was  24 off tamsulosin but now 13 on tamsulosin.  This was discontinued temporarily because of a detached retina.  He appears to have more irritative symptoms rather than obstructive symptoms.  According to Dr. Lynne [name] notes, he does have some degree of erectile dysfunction.  No GI difficulties.  Medical comorbidities include non-insulin-dependent diabetes mellitus.   PREVIOUS RADIATION THERAPY: No   PAST MEDICAL HISTORY:  has a past medical history of Hypertension; Diabetes mellitus without complication; Sleep apnea; GERD (gastroesophageal reflux disease); Blood dyscrasia; Wears glasses; and Prostate cancer (11/11/2014).     PAST SURGICAL HISTORY:  Past Surgical History  Procedure Laterality Date  . Nasal setoplasty    . Tonsillectomy and adenoidectomy    . Scleral buckle with cryo Right 08/19/2013    Procedure: SCLERAL BUCKLE WITH CRYOPEXY;  Surgeon: Hurman Horn, MD;   Location: [place];  Service: Ophthalmology;  Laterality: Right;  . Gas insertion Right 08/19/2013    Procedure: INSERTION OF GAS;  Surgeon: Hurman Horn, MD;  Location: [place];  Service: Ophthalmology;  Laterality: Right;  SF6  . Colonoscopy    . 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 Prostate biopsy  11/11/14     FAMILY HISTORY: family history includes Bowel Disease in his father; Dementia in his father; Kidney Stones in his mother.     SOCIAL HISTORY:  reports that he has never smoked. He does not have any smokeless tobacco history on file. He reports that he drinks about 1.2 oz of alcohol per week. He reports that he does not use illicit drugs.  Married, 2 children.  He works as a Technical brewer and also for [employer]  He has run multiple marathons.   ALLERGIES: Sporanox and Ace inhibitors   MEDICATIONS:  Current Outpatient Prescriptions  Medication Sig Dispense Refill  . aspirin (ADULT ASPIRIN EC LOW STRENGTH) 81 MG EC tablet Take 81 mg by mouth daily.      [name] Kitchen atorvastatin (LIPITOR) 20 MG tablet Take 10 mg by mouth daily.     . Cholecalciferol (VITAMIN D3) 1000 UNITS CAPS Take 2  capsules by mouth daily.     . famotidine (PEPCID) 20 MG tablet Take 20 mg by mouth as needed for heartburn or indigestion.    . fluticasone (FLONASE) 50 MCG/ACT nasal spray Place 2 sprays into both nostrils daily.    [name] Kitchen glucose blood (FREESTYLE TEST STRIPS) test strip 1 each daily as needed.      . Lancets (FREESTYLE) lancets 1 each daily as needed.      . metFORMIN (GLUCOPHAGE) 500 MG tablet Take 1,000 mg by mouth daily with breakfast.    . Multiple Vitamin (MULTIVITAMINS PO) daily.     . tamsulosin (FLOMAX) 0.4 MG CAPS capsule Take 0.4 mg by mouth daily.     No current  facility-administered medications for this encounter.     REVIEW OF SYSTEMS:  Pertinent items are noted in HPI.    PHYSICAL EXAM:  height is 6' (1.829 m) and weight is 191 lb 14.4 oz (87.045 kg). His oral temperature is 98.3 F (36.8 C). His blood pressure is 115/63 and his pulse is 57. His respiration is 16 and oxygen saturation is 96%.   Alert and oriented.    rectal examination not performed today.     LABORATORY DATA:  Lab Results  Component Value Date   WBC 5.8 08/19/2013   HGB 14.6 07/08/2014   HCT 43.0 07/08/2014   MCV 89.7 08/19/2013   PLT 125* 08/19/2013   Lab Results  Component Value Date   NA 141 07/08/2014   K 3.9 07/08/2014   CL 103 07/08/2014   CO2 27 08/19/2013   Lab Results  Component Value Date   ALT 34 11/24/2010   AST 32 11/24/2010   ALKPHOS 46 11/24/2010   BILITOT 0.9 11/24/2010    PSA 3.57 from 10/23/2014.     IMPRESSION: Stage T1c favorable risk adenocarcinoma the prostate.  I explained to Timothy [name] and his wife that his prognosis is related to his stage, Gleason score, and PSA level.  All are favorable.  Other prognostic factors include PSA doubling time and disease volume which are also favorable.  We discussed management options including surgery versus active surveillance versus radiation therapy.  He is not interested in active surveillance.  Even though his I PSS score is now below 15, I do not feel that he would be a good candidate for seed implantation because of his irritative urinary symptomatology.  He would be a candidate for external beam/IMRT.  We discussed the potential acute and late toxicities of external beam/IMRT.  We also discussed hypofractionated radiation therapy and stereotactic radiosurgery which are felt to be somewhat experimental with limited follow-up.  Regardless of his choice of therapy, his prognosis is excellent.  Considering his age, I feel that he would be an excellent candidate for robotic surgery.  We would get a pathologic  stage and he would know that within a short period of time that he would be in clinical remission.  We also discussed the role of adjuvant or salvage radiation therapy, but this would be extremely unlikely.  He'll coordinate his schedule with Dr. Alinda [name], and he may wait until October after a family wedding before proceeding with surgery.   PLAN:As discussed above.   I spent 45 minutes face to face with the patient and more than 50% of that time was spent in counseling and/or coordination of care.

## 2015-02-18 ENCOUNTER — Other Ambulatory Visit: Payer: Self-pay | Admitting: Urology

## 2015-05-01 NOTE — Patient Instructions (Addendum)
Timothy [name]  05/01/2015   Your procedure is scheduled on:   05/11/2015    Report to [facility]  Entrance take [place]  elevators to 3rd floor to  [place] at     0930 AM.  Call this number if you have problems the morning of surgery [phone]   Remember: ONLY 1 PERSON MAY GO WITH YOU TO SHORT STAY TO GET  READY MORNING OF [place].  Do not eat food or drink liquids :After Midnight.  Magnesium citrate - 8-10 ounces day before surgery.              Fleets enema nite before surgery    Take these medicines the morning of surgery with A SIP OF WATER: Zyrtec, pepcid if needed, Flonase, Flomax DO NOT TAKE ANY DIABETIC MEDICATIONS DAY OF YOUR SURGERY                               You may not have any metal on your body including hair pins and              piercings  Do not wear jewelry,  lotions, powders or perfumes, deodorant                       Men may shave face and neck.   Do not bring valuables to the hospital. [place].  Contacts, dentures or bridgework may not be worn into surgery.  Leave suitcase in the car. After surgery it may be brought to your room.        Special Instructions:  Coughing and deep breathing exercises, leg exercises               Please read over the following fact sheets you were given: _____________________________________________________________________             [facility] - Preparing for Surgery Before surgery, you can play an important role.  Because skin is not sterile, your skin needs to be as free of germs as possible.  You can reduce the number of germs on your skin by washing with CHG (chlorahexidine gluconate) soap before surgery.  CHG is an antiseptic cleaner which kills germs and bonds with the skin to continue killing germs even after washing. Please DO NOT use if you have an allergy to CHG or antibacterial soaps.  If your skin becomes  reddened/irritated stop using the CHG and inform your nurse when you arrive at Short Stay. Do not shave (including legs and underarms) for at least 48 hours prior to the first CHG shower.  You may shave your face/neck. Please follow these instructions carefully:  1.  Shower with CHG Soap the night before surgery and the  morning of Surgery.  2.  If you choose to wash your hair, wash your hair first as usual with your  normal  shampoo.  3.  After you shampoo, rinse your hair and body thoroughly to remove the  shampoo.                           4.  Use CHG as you would any other liquid soap.  You can apply  chg directly  to the skin and wash                       Gently with a scrungie or clean washcloth.  5.  Apply the CHG Soap to your body ONLY FROM THE NECK DOWN.   Do not use on face/ open                           Wound or open sores. Avoid contact with eyes, ears mouth and genitals (private parts).                       Wash face,  Genitals (private parts) with your normal soap.             6.  Wash thoroughly, paying special attention to the area where your surgery  will be performed.  7.  Thoroughly rinse your body with warm water from the neck down.  8.  DO NOT shower/wash with your normal soap after using and rinsing off  the CHG Soap.                9.  Pat yourself dry with a clean towel.            10.  Wear clean pajamas.            11.  Place clean sheets on your bed the night of your first shower and do not  sleep with pets. Day of Surgery : Do not apply any lotions/deodorants the morning of surgery.  Please wear clean clothes to the hospital/surgery center.  FAILURE TO FOLLOW THESE INSTRUCTIONS MAY RESULT IN THE CANCELLATION OF YOUR SURGERY PATIENT SIGNATURE_________________________________  NURSE SIGNATURE__________________________________  ________________________________________________________________________  WHAT IS A BLOOD TRANSFUSION? Blood Transfusion Information  A  transfusion is the replacement of blood or some of its parts. Blood is made up of multiple cells which provide different functions.  Red blood cells carry oxygen and are used for blood loss replacement.  White blood cells fight against infection.  Platelets control bleeding.  Plasma helps clot blood.  Other blood products are available for specialized needs, such as hemophilia or other clotting disorders. BEFORE THE TRANSFUSION  Who gives blood for transfusions?   Healthy volunteers who are fully evaluated to make sure their blood is safe. This is blood bank blood. Transfusion therapy is the safest it has ever been in the practice of medicine. Before blood is taken from a donor, a complete history is taken to make sure that person has no history of diseases nor engages in risky social behavior (examples are intravenous drug use or sexual activity with multiple partners). The donor's travel history is screened to minimize risk of transmitting infections, such as malaria. The donated blood is tested for signs of infectious diseases, such as HIV and hepatitis. The blood is then tested to be sure it is compatible with you in order to minimize the chance of a transfusion reaction. If you or a relative donates blood, this is often done in anticipation of surgery and is not appropriate for emergency situations. It takes many days to process the donated blood. RISKS AND COMPLICATIONS Although transfusion therapy is very safe and saves many lives, the main dangers of transfusion include:  1. Getting an infectious disease. 2. Developing a transfusion reaction. This is an allergic reaction to something in the blood you were given. Every precaution is  taken to prevent this. The decision to have a blood transfusion has been considered carefully by your caregiver before blood is given. Blood is not given unless the benefits outweigh the risks. AFTER THE TRANSFUSION  Right after receiving a blood  transfusion, you will usually feel much better and more energetic. This is especially true if your red blood cells have gotten low (anemic). The transfusion raises the level of the red blood cells which carry oxygen, and this usually causes an energy increase.  The nurse administering the transfusion will monitor you carefully for complications. HOME CARE INSTRUCTIONS  No special instructions are needed after a transfusion. You may find your energy is better. Speak with your caregiver about any limitations on activity for underlying diseases you may have. SEEK MEDICAL CARE IF:   Your condition is not improving after your transfusion.  You develop redness or irritation at the intravenous (IV) site. SEEK IMMEDIATE MEDICAL CARE IF:  Any of the following symptoms occur over the next 12 hours:  Shaking chills.  You have a temperature by mouth above 102 F (38.9 C), not controlled by medicine.  Chest, back, or muscle pain.  People around you feel you are not acting correctly or are confused.  Shortness of breath or difficulty breathing.  Dizziness and fainting.  You get a rash or develop hives.  You have a decrease in urine output.  Your urine turns a dark color or changes to pink, red, or brown. Any of the following symptoms occur over the next 10 days:  You have a temperature by mouth above 102 F (38.9 C), not controlled by medicine.  Shortness of breath.  Weakness after normal activity.  The white part of the eye turns yellow (jaundice).  You have a decrease in the amount of urine or are urinating less often.  Your urine turns a dark color or changes to pink, red, or brown. Document Released: 06/10/2000 Document Revised: 09/05/2011 Document Reviewed: 01/28/2008 ExitCare Patient Information 2014 [place].  _______________________________________________________________________  Incentive Spirometer  An incentive spirometer is a tool that can help keep your  lungs clear and active. This tool measures how well you are filling your lungs with each breath. Taking long deep breaths may help reverse or decrease the chance of developing breathing (pulmonary) problems (especially infection) following:  A long period of time when you are unable to move or be active. BEFORE THE PROCEDURE   If the spirometer includes an indicator to show your best effort, your nurse or respiratory therapist will set it to a desired goal.  If possible, sit up straight or lean slightly forward. Try not to slouch.  Hold the incentive spirometer in an upright position. INSTRUCTIONS FOR USE  3. Sit on the edge of your bed if possible, or sit up as far as you can in bed or on a chair. 4. Hold the incentive spirometer in an upright position. 5. Breathe out normally. 6. Place the mouthpiece in your mouth and seal your lips tightly around it. 7. Breathe in slowly and as deeply as possible, raising the piston or the ball toward the top of the column. 8. Hold your breath for 3-5 seconds or for as long as possible. Allow the piston or ball to fall to the bottom of the column. 9. Remove the mouthpiece from your mouth and breathe out normally. 10. Rest for a few seconds and repeat Steps 1 through 7 at least 10 times every 1-2 hours when you are awake. Take your  time and take a few normal breaths between deep breaths. 11. The spirometer may include an indicator to show your best effort. Use the indicator as a goal to work toward during each repetition. 12. After each set of 10 deep breaths, practice coughing to be sure your lungs are clear. If you have an incision (the cut made at the time of surgery), support your incision when coughing by placing a pillow or rolled up towels firmly against it. Once you are able to get out of bed, walk around indoors and cough well. You may stop using the incentive spirometer when instructed by your caregiver.  RISKS AND COMPLICATIONS  Take your time so  you do not get dizzy or light-headed.  If you are in pain, you may need to take or ask for pain medication before doing incentive spirometry. It is harder to take a deep breath if you are having pain. AFTER USE  Rest and breathe slowly and easily.  It can be helpful to keep track of a log of your progress. Your caregiver can provide you with a simple table to help with this. If you are using the spirometer at home, follow these instructions: [place] IF:   You are having difficultly using the spirometer.  You have trouble using the spirometer as often as instructed.  Your pain medication is not giving enough relief while using the spirometer.  You develop fever of 100.5 F (38.1 C) or higher. SEEK IMMEDIATE MEDICAL CARE IF:   You cough up bloody sputum that had not been present before.  You develop fever of 102 F (38.9 C) or greater.  You develop worsening pain at or near the incision site. MAKE SURE YOU:   Understand these instructions.  Will watch your condition.  Will get help right away if you are not doing well or get worse. Document Released: 10/24/2006 Document Revised: 09/05/2011 Document Reviewed: 12/25/2006 [facility] Patient Information 2014 [place], [place].   ________________________________________________________________________

## 2015-05-04 ENCOUNTER — Encounter (HOSPITAL_COMMUNITY): Payer: Self-pay

## 2015-05-04 ENCOUNTER — Encounter (HOSPITAL_COMMUNITY)
Admission: RE | Admit: 2015-05-04 | Discharge: 2015-05-04 | Disposition: A | Discharge status: Home / Self Care | Payer: 59 | Source: Ambulatory Visit | Attending: Urology | Admitting: Urology

## 2015-05-04 ENCOUNTER — Ambulatory Visit (HOSPITAL_COMMUNITY)
Admission: RE | Admit: 2015-05-04 | Discharge: 2015-05-04 | Disposition: A | Discharge status: Home / Self Care | Payer: 59 | Source: Ambulatory Visit | Attending: Anesthesiology | Admitting: Anesthesiology

## 2015-05-04 DIAGNOSIS — Z01818 Encounter for other preprocedural examination: Secondary | ICD-10-CM | POA: Insufficient documentation

## 2015-05-04 DIAGNOSIS — C61 Malignant neoplasm of prostate: Secondary | ICD-10-CM | POA: Insufficient documentation

## 2015-05-04 HISTORY — DX: Pneumonia, unspecified organism: J18.9

## 2015-05-04 LAB — CBC
HEMATOCRIT: 42.2 % (ref 39.0–52.0)
HEMOGLOBIN: 14.8 g/dL (ref 13.0–17.0)
MCH: 31.7 pg (ref 26.0–34.0)
MCHC: 35.1 g/dL (ref 30.0–36.0)
MCV: 90.4 fL (ref 78.0–100.0)
Platelets: 150 10*3/uL (ref 150–400)
RBC: 4.67 MIL/uL (ref 4.22–5.81)
RDW: 12.5 % (ref 11.5–15.5)
WBC: 5.7 10*3/uL (ref 4.0–10.5)

## 2015-05-04 LAB — BASIC METABOLIC PANEL
ANION GAP: 7 (ref 5–15)
BUN: 21 mg/dL — AB (ref 6–20)
CALCIUM: 10 mg/dL (ref 8.9–10.3)
CO2: 28 mmol/L (ref 22–32)
Chloride: 106 mmol/L (ref 101–111)
Creatinine, Ser: 1.07 mg/dL (ref 0.61–1.24)
GFR calc Af Amer: >60 mL/min (ref >60)
GFR calc non Af Amer: >60 mL/min (ref >60)
GLUCOSE: 122 mg/dL — AB (ref 65–99)
Potassium: 4 mmol/L (ref 3.5–5.1)
Sodium: 141 mmol/L (ref 135–145)

## 2015-05-04 LAB — ABO/RH: ABO/RH(D): O NEG

## 2015-05-04 NOTE — Progress Notes (Signed)
LOV with Dr Lavone Orn- 04/17/2015 on chart

## 2015-05-04 NOTE — Progress Notes (Signed)
BMP results done 05/04/15 faxed via EPIC to Dr Alinda Money.

## 2015-05-05 NOTE — Progress Notes (Signed)
Final EKG done 05/04/15 and CXR done 05/04/2015 in EPIC .

## 2015-05-08 NOTE — H&P (Signed)
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 This confirmed Gleason 3+3=6 adenocarcinoma of the prostate with 3 out of 12 biopsy cores positive for malignancy. He has no family history of prostate cancer. He is very healthy in general with a history of type II diabetes that is well controlled and OSA treated with CPAP. He has run multiple marathons.    TNM stage: cT1c Nx Mx  PSA: 3.57  Gleason score: 3+3=6  Biopsy (11/11/14): 3/12 cores positive -- L lateral apex (20%), R lateral apex (20%, PNI), R lateral mid (40%)  Prostate volume: 29.5 cc  PSAD: 0.12    Nomogram  OC disease: 63%   EPE: 37%  SVI: 1%  LNI: 1%  PFS (surgery): 97% at 4 years, 94% at 9 years    Urinary function: He does have fairly significant lower urinary tract symptoms. IPSS is 24. His symptoms are mostly irritative symptoms including frequency, urgency, and nocturia although he does have fairly significant intermittency and weak stream. He does admit to drinking a large amount of caffeine.  Erectile function: He does have mild to moderate erectile dysfunction. SHIM score is 12. He states that he can reliably achieve erections adequate for intercourse although they are not as firm as they previously were. He has not been previously treated with PDE 5 inhibitors.    Interval history:    Pat follows up today in preparation for his upcoming radical prostatectomy. He has remained in stable overall health and has no new complaints or concerns today.     Past Medical History Problems  1. History of diabetes mellitus (Z86.39) 2. History of hyperlipidemia (Z86.39) 3. History of sleep apnea (Z87.09)  Surgical History Problems  1. History of Biopsy Skin 2. History of Repair Of Retinal Detachment 3. History of Septoplasty 4. History of Tonsillectomy With Adenoidectomy  Current Meds 1.  Aspir-81 81 MG Oral Tablet Delayed Release;  Therapy: (Recorded:28Apr2016) to Recorded 2. Diazepam 10 MG Oral Tablet; Take tablet 30 min. prior to procedure;  Therapy: [id number] to (Last [id number]) Ordered 3. Flonase 50 MCG/ACT SUSP;  Therapy: (Recorded:28Apr2016) to Recorded 4. Levofloxacin 500 MG Oral Tablet; 1 po q day beginning the day prior to biopsy;  Therapy: [id number] to (Last [id number])  Requested for: [id number] Ordered 5. Lipitor 10 MG Oral Tablet;  Therapy: (Recorded:28Apr2016) to Recorded 6. MetFORMIN HCl - 500 MG Oral Tablet;  Therapy: (Recorded:28Apr2016) to Recorded 7. Multi-Vitamin TABS;  Therapy: (Recorded:28Apr2016) to Recorded 8. Vitamin D 2000 UNIT Oral Tablet;  Therapy: (Recorded:28Apr2016) to Recorded  Allergies Medication  1. Lisinopril TABS 2. Sporanox  Family History Problems  1. Family history of dementia (Z81.8) : Father 2. Family history of ischemic bowel disease (Z83.79) : Father 3. Denied: Family history of prostate cancer 4. Family history of Urinary calculus : Mother  Social History Problems  1. Alcohol use (Z78.9)   0 to 1 serving, maybe every 3 days 2. Married 3. Never a smoker 4. Occupation   MD  Vitals Vital Signs [Data Includes: Last 1 Day]  Recorded: [id number] 04:07PM  Blood Pressure: 100 / 61 Heart Rate: 60  Physical Exam Constitutional: Well nourished and well developed . No acute distress.  ENT:. The ears and nose are normal in appearance.  Neck: The appearance of the neck is normal and no neck mass is present.  Pulmonary: No respiratory distress and normal respiratory rhythm and effort.  Cardiovascular: Heart rate and rhythm are normal . No peripheral edema.  Abdomen: The abdomen is soft and nontender. No masses are palpated. No CVA tenderness. No hernias are palpable. No hepatosplenomegaly noted.  Rectal: Prostate size is estimated to be 40 g.  Skin: Normal skin turgor, no visible rash and no visible skin lesions.   Neuro/Psych:. Mood and affect are appropriate.    Results/Data Urine [Data Includes: Last 1 Day]   [id number]  COLOR YELLOW   APPEARANCE CLEAR   SPECIFIC GRAVITY 1.020   pH 5.5   GLUCOSE TRACE   BILIRUBIN NEGATIVE   KETONE NEGATIVE   BLOOD TRACE   PROTEIN NEGATIVE   NITRITE NEGATIVE   LEUKOCYTE ESTERASE NEGATIVE   SQUAMOUS EPITHELIAL/HPF NONE SEEN HPF  WBC 0-5 WBC/HPF  RBC 0-2 RBC/HPF  BACTERIA NONE SEEN HPF  CRYSTALS NONE SEEN HPF  CASTS See Below LPF  Yeast NONE SEEN HPF   Assessment Assessed  1. Prostate cancer (C61)  Plan Health Maintenance  1. UA With REFLEX; [Do Not Release]; Status:Complete;   [id number] 03:59PM Prostate cancer  2. Follow-up Keep Future Appt Office  Follow-up  Status: Hold For - Appointment  Requested  for: [id number] 3. PSA; Status:In Progress - Specimen/Data Collected;   Done: [id number] 4. VENIPUNCTURE; Status:Complete;   [id number]  Discussion/Summary 1. Low risk prostate cancer: He is ready to proceed with his bilateral nerve sparing robot-assisted laparoscopic radical prostatectomy next week. He did ask multiple questions which were answered to his stated satisfaction. He is adamant that he does wish to proceed with definitive curative therapy rather than active surveillance. He understands the potential risks involved including possible long-term risks related to urinary function and erectile function. We reviewed many of the specific risks associated with surgical treatment of prostate cancer today as previously discussed at our last visit. I outlined the expected postoperative recovery process and plan. He will have a PSA drawn today as a baseline considering his last PSA was approximately 6 months ago.   A total of 25 minutes were spent in the overall care of the patient today with 25 minutes in direct face to face consultation.     Cc: Dr. Lavone Orn      Verified Results PSA1 [phone] 04:52PM1 Read Drivers  SPECIMEN  TYPE: BLOOD  [May 07, 2015 7:12AM Dannia Snook] Please call Dr. Joya Gaskins today and let him know that his PSA from yesterday was 2.97. This would indicate there is not any significant progression of his cancer since his biopsy and we can proceed as planned without any changes to our intraoperative surgical plan. Let me know if he has any questions and I am happy to discuss further if he likes.   Test Name Result Flag Reference  PSA1 2.97 ng/mL1  <=4.001  TEST METHODOLOGY: ECLIA PSA (ELECTROCHEMILUMINESCENCE IMMUNOASSAY)     1. Amended By: Raynelle Bring; May 07 2015 7:12 AM EST  Signatures Electronically signed by : Raynelle Bring, M.D.; May 07 2015  7:12AM EST

## 2015-05-11 ENCOUNTER — Inpatient Hospital Stay (HOSPITAL_COMMUNITY): Payer: 59 | Admitting: Certified Registered"

## 2015-05-11 ENCOUNTER — Inpatient Hospital Stay (HOSPITAL_COMMUNITY)
Admission: RE | Admit: 2015-05-11 | Discharge: 2015-05-12 | DRG: 708 | Disposition: A | Discharge status: Home / Self Care | Payer: 59 | Source: Ambulatory Visit | Attending: Urology | Admitting: Urology

## 2015-05-11 ENCOUNTER — Encounter (HOSPITAL_COMMUNITY)
Admission: RE | Disposition: A | Discharge status: Home / Self Care | Payer: Self-pay | Source: Ambulatory Visit | Attending: Urology

## 2015-05-11 ENCOUNTER — Encounter (HOSPITAL_COMMUNITY): Payer: Self-pay | Admitting: Certified Registered"

## 2015-05-11 DIAGNOSIS — N529 Male erectile dysfunction, unspecified: Secondary | ICD-10-CM | POA: Diagnosis present

## 2015-05-11 DIAGNOSIS — Z7982 Long term (current) use of aspirin: Secondary | ICD-10-CM | POA: Diagnosis not present

## 2015-05-11 DIAGNOSIS — Z01812 Encounter for preprocedural laboratory examination: Secondary | ICD-10-CM | POA: Diagnosis not present

## 2015-05-11 DIAGNOSIS — R3912 Poor urinary stream: Secondary | ICD-10-CM | POA: Diagnosis present

## 2015-05-11 DIAGNOSIS — R351 Nocturia: Secondary | ICD-10-CM | POA: Diagnosis present

## 2015-05-11 DIAGNOSIS — E785 Hyperlipidemia, unspecified: Secondary | ICD-10-CM | POA: Diagnosis present

## 2015-05-11 DIAGNOSIS — Z79899 Other long term (current) drug therapy: Secondary | ICD-10-CM | POA: Diagnosis not present

## 2015-05-11 DIAGNOSIS — E119 Type 2 diabetes mellitus without complications: Secondary | ICD-10-CM | POA: Diagnosis present

## 2015-05-11 DIAGNOSIS — C61 Malignant neoplasm of prostate: Principal | ICD-10-CM | POA: Diagnosis present

## 2015-05-11 DIAGNOSIS — G4733 Obstructive sleep apnea (adult) (pediatric): Secondary | ICD-10-CM | POA: Diagnosis present

## 2015-05-11 HISTORY — PX: ROBOT ASSISTED LAPAROSCOPIC RADICAL PROSTATECTOMY: SHX5141

## 2015-05-11 LAB — GLUCOSE, CAPILLARY
GLUCOSE-CAPILLARY: 145 mg/dL — AB (ref 65–99)
GLUCOSE-CAPILLARY: 157 mg/dL — AB (ref 65–99)
GLUCOSE-CAPILLARY: 166 mg/dL — AB (ref 65–99)
Glucose-Capillary: 121 mg/dL — ABNORMAL HIGH (ref 65–99)

## 2015-05-11 LAB — TYPE AND SCREEN
ABO/RH(D): O NEG
Antibody Screen: NEGATIVE

## 2015-05-11 LAB — HEMOGLOBIN AND HEMATOCRIT, BLOOD
HEMATOCRIT: 42.3 % (ref 39.0–52.0)
Hemoglobin: 15 g/dL (ref 13.0–17.0)

## 2015-05-11 SURGERY — ROBOTIC ASSISTED LAPAROSCOPIC RADICAL PROSTATECTOMY LEVEL 1
Anesthesia: General

## 2015-05-11 MED ORDER — KETOROLAC TROMETHAMINE 15 MG/ML IJ SOLN: 15.0000 mg | Freq: Four times a day (QID) | INTRAMUSCULAR | Status: DC

## 2015-05-11 MED ORDER — ROCURONIUM BROMIDE 100 MG/10ML IV SOLN
INTRAVENOUS | Status: DC | PRN
  Administered 2015-05-11: 20 mg via INTRAVENOUS
  Administered 2015-05-11: 50 mg via INTRAVENOUS
  Administered 2015-05-11: 10 mg via INTRAVENOUS

## 2015-05-11 MED ORDER — SODIUM CHLORIDE 0.9 % IV BOLUS (SEPSIS): 1000.0000 mL | Freq: Once | INTRAVENOUS | Status: DC

## 2015-05-11 MED ORDER — DIPHENHYDRAMINE HCL 50 MG/ML IJ SOLN: 12.5000 mg | Freq: Four times a day (QID) | INTRAMUSCULAR | Status: DC | PRN

## 2015-05-11 MED ORDER — SUGAMMADEX SODIUM 200 MG/2ML IV SOLN
INTRAVENOUS | Status: DC | PRN
  Administered 2015-05-11: 175 mg via INTRAVENOUS

## 2015-05-11 MED ORDER — SODIUM CHLORIDE 0.9 % IV SOLN
INTRAVENOUS | Status: DC
  Administered 2015-05-11: 15:00:00 via INTRAVENOUS

## 2015-05-11 MED ORDER — MORPHINE SULFATE (PF) 2 MG/ML IV SOLN: 2.0000 mg | INTRAVENOUS | Status: DC | PRN

## 2015-05-11 MED ORDER — SODIUM CHLORIDE 0.9 % IV BOLUS (SEPSIS)
1000.0000 mL | Freq: Once | INTRAVENOUS | Status: AC
  Administered 2015-05-11: 1000 mL via INTRAVENOUS

## 2015-05-11 MED ORDER — LACTATED RINGERS IV SOLN
INTRAVENOUS | Status: DC
  Administered 2015-05-11: 1000 mL via INTRAVENOUS

## 2015-05-11 MED ORDER — MORPHINE SULFATE (PF) 2 MG/ML IV SOLN
1.0000 mg | INTRAVENOUS | Status: DC | PRN
  Administered 2015-05-11: 2 mg via INTRAVENOUS
  Filled 2015-05-11: qty 1

## 2015-05-11 MED ORDER — BUPIVACAINE-EPINEPHRINE 0.25% -1:200000 IJ SOLN
INTRAMUSCULAR | Status: DC | PRN
  Administered 2015-05-11: 30 mL

## 2015-05-11 MED ORDER — SODIUM CHLORIDE 0.9 % IV SOLN
INTRAVENOUS | Status: DC
Start: 2015-05-11 — End: 2015-05-11
  Administered 2015-05-11: 16:00:00 via INTRAVENOUS

## 2015-05-11 MED ORDER — KCL IN DEXTROSE-NACL 20-5-0.45 MEQ/L-%-% IV SOLN: INTRAVENOUS | Status: DC

## 2015-05-11 MED ORDER — SODIUM CHLORIDE 0.9 % IV SOLN
INTRAVENOUS | Status: DC | PRN
  Administered 2015-05-11: 14:00:00 via INTRAVENOUS

## 2015-05-11 MED ORDER — FLUTICASONE PROPIONATE 50 MCG/ACT NA SUSP
2.0000 | Freq: Every day | NASAL | Status: DC
  Administered 2015-05-11 – 2015-05-12 (×2): 2 via NASAL
  Filled 2015-05-11: qty 16

## 2015-05-11 MED ORDER — HYDROMORPHONE HCL 2 MG/ML IJ SOLN
INTRAMUSCULAR | Status: AC
  Filled 2015-05-11: qty 1

## 2015-05-11 MED ORDER — POTASSIUM CHLORIDE IN NACL 20-0.45 MEQ/L-% IV SOLN
INTRAVENOUS | Status: DC
  Administered 2015-05-11 – 2015-05-12 (×2): via INTRAVENOUS
  Filled 2015-05-11 (×4): qty 1000

## 2015-05-11 MED ORDER — FENTANYL CITRATE (PF) 100 MCG/2ML IJ SOLN
INTRAMUSCULAR | Status: AC
  Filled 2015-05-11: qty 2

## 2015-05-11 MED ORDER — ONDANSETRON HCL 4 MG/2ML IJ SOLN: 4.0000 mg | INTRAMUSCULAR | Status: DC | PRN

## 2015-05-11 MED ORDER — MIDAZOLAM HCL 2 MG/2ML IJ SOLN
INTRAMUSCULAR | Status: AC
  Filled 2015-05-11: qty 4

## 2015-05-11 MED ORDER — FENTANYL CITRATE (PF) 100 MCG/2ML IJ SOLN
25.0000 ug | INTRAMUSCULAR | Status: DC | PRN
  Administered 2015-05-11: 25 ug via INTRAVENOUS

## 2015-05-11 MED ORDER — CEFAZOLIN SODIUM 1-5 GM-% IV SOLN: 1.0000 g | Freq: Three times a day (TID) | INTRAVENOUS | Status: DC

## 2015-05-11 MED ORDER — DIPHENHYDRAMINE HCL 12.5 MG/5ML PO ELIX: 12.5000 mg | ORAL_SOLUTION | Freq: Four times a day (QID) | ORAL | Status: DC | PRN

## 2015-05-11 MED ORDER — OXYCODONE HCL 5 MG PO TABS
5.0000 mg | ORAL_TABLET | ORAL | Status: DC | PRN
  Administered 2015-05-11: 5 mg via ORAL
  Filled 2015-05-11: qty 1

## 2015-05-11 MED ORDER — SODIUM CHLORIDE 0.9 % IR SOLN
Status: DC | PRN
  Administered 2015-05-11: 1000 mL

## 2015-05-11 MED ORDER — SENNOSIDES-DOCUSATE SODIUM 8.6-50 MG PO TABS
2.0000 | ORAL_TABLET | Freq: Every day | ORAL | Status: DC
  Administered 2015-05-11: 2 via ORAL
  Filled 2015-05-11: qty 2

## 2015-05-11 MED ORDER — ACETAMINOPHEN 325 MG PO TABS: 650.0000 mg | ORAL_TABLET | ORAL | Status: DC | PRN

## 2015-05-11 MED ORDER — ONDANSETRON HCL 4 MG/2ML IJ SOLN
INTRAMUSCULAR | Status: DC | PRN
  Administered 2015-05-11: 4 mg via INTRAVENOUS

## 2015-05-11 MED ORDER — ROCURONIUM BROMIDE 100 MG/10ML IV SOLN
INTRAVENOUS | Status: AC
  Filled 2015-05-11: qty 1

## 2015-05-11 MED ORDER — BUPIVACAINE-EPINEPHRINE (PF) 0.25% -1:200000 IJ SOLN
INTRAMUSCULAR | Status: AC
  Filled 2015-05-11: qty 30

## 2015-05-11 MED ORDER — ACETAMINOPHEN 10 MG/ML IV SOLN
INTRAVENOUS | Status: AC
  Filled 2015-05-11: qty 100

## 2015-05-11 MED ORDER — SUGAMMADEX SODIUM 200 MG/2ML IV SOLN
INTRAVENOUS | Status: AC
  Filled 2015-05-11: qty 2

## 2015-05-11 MED ORDER — KCL IN DEXTROSE-NACL 20-5-0.45 MEQ/L-%-% IV SOLN
INTRAVENOUS | Status: DC
  Filled 2015-05-11 (×2): qty 1000

## 2015-05-11 MED ORDER — HYDROMORPHONE HCL 1 MG/ML IJ SOLN
INTRAMUSCULAR | Status: DC | PRN
  Administered 2015-05-11 (×2): 1 mg via INTRAVENOUS

## 2015-05-11 MED ORDER — CEFAZOLIN SODIUM-DEXTROSE 2-3 GM-% IV SOLR
2.0000 g | INTRAVENOUS | Status: AC
  Administered 2015-05-11: 2 g via INTRAVENOUS

## 2015-05-11 MED ORDER — PROMETHAZINE HCL 25 MG/ML IJ SOLN: 6.2500 mg | INTRAMUSCULAR | Status: DC | PRN

## 2015-05-11 MED ORDER — CEFAZOLIN SODIUM-DEXTROSE 2-3 GM-% IV SOLR
INTRAVENOUS | Status: AC
  Filled 2015-05-11: qty 50

## 2015-05-11 MED ORDER — LACTATED RINGERS IV SOLN
INTRAVENOUS | Status: DC | PRN
  Administered 2015-05-11: 12:00:00

## 2015-05-11 MED ORDER — INSULIN ASPART 100 UNIT/ML ~~LOC~~ SOLN: 0.0000 [IU] | SUBCUTANEOUS | Status: DC

## 2015-05-11 MED ORDER — PROPOFOL 10 MG/ML IV BOLUS
INTRAVENOUS | Status: DC | PRN
  Administered 2015-05-11: 180 mg via INTRAVENOUS

## 2015-05-11 MED ORDER — LIDOCAINE HCL (CARDIAC) 20 MG/ML IV SOLN
INTRAVENOUS | Status: DC | PRN
  Administered 2015-05-11: 50 mg via INTRAVENOUS

## 2015-05-11 MED ORDER — KETOROLAC TROMETHAMINE 15 MG/ML IJ SOLN
15.0000 mg | Freq: Four times a day (QID) | INTRAMUSCULAR | Status: DC
  Administered 2015-05-11 – 2015-05-12 (×4): 15 mg via INTRAVENOUS
  Filled 2015-05-11 (×4): qty 1

## 2015-05-11 MED ORDER — ACETAMINOPHEN 10 MG/ML IV SOLN
1000.0000 mg | Freq: Four times a day (QID) | INTRAVENOUS | Status: DC
  Administered 2015-05-11: 1000 mg via INTRAVENOUS
  Filled 2015-05-11 (×2): qty 100

## 2015-05-11 MED ORDER — FENTANYL CITRATE (PF) 100 MCG/2ML IJ SOLN
INTRAMUSCULAR | Status: DC | PRN
  Administered 2015-05-11 (×2): 50 ug via INTRAVENOUS
  Administered 2015-05-11: 100 ug via INTRAVENOUS
  Administered 2015-05-11: 50 ug via INTRAVENOUS

## 2015-05-11 MED ORDER — DEXAMETHASONE SODIUM PHOSPHATE 10 MG/ML IJ SOLN
INTRAMUSCULAR | Status: AC
  Filled 2015-05-11: qty 1

## 2015-05-11 MED ORDER — DOCUSATE SODIUM 100 MG PO CAPS
100.0000 mg | ORAL_CAPSULE | Freq: Two times a day (BID) | ORAL | Status: DC
  Administered 2015-05-11: 100 mg via ORAL
  Filled 2015-05-11: qty 1

## 2015-05-11 MED ORDER — PROPOFOL 10 MG/ML IV BOLUS
INTRAVENOUS | Status: AC
  Filled 2015-05-11: qty 20

## 2015-05-11 MED ORDER — LORATADINE 10 MG PO TABS
10.0000 mg | ORAL_TABLET | Freq: Every day | ORAL | Status: DC
  Administered 2015-05-12: 10 mg via ORAL
  Filled 2015-05-11 (×2): qty 1

## 2015-05-11 MED ORDER — LIDOCAINE HCL (CARDIAC) 20 MG/ML IV SOLN
INTRAVENOUS | Status: AC
  Filled 2015-05-11: qty 5

## 2015-05-11 MED ORDER — SULFAMETHOXAZOLE-TRIMETHOPRIM 800-160 MG PO TABS: 1.0000 | ORAL_TABLET | Freq: Two times a day (BID) | ORAL | Status: DC

## 2015-05-11 MED ORDER — MIDAZOLAM HCL 5 MG/5ML IJ SOLN
INTRAMUSCULAR | Status: DC | PRN
  Administered 2015-05-11: 2 mg via INTRAVENOUS

## 2015-05-11 MED ORDER — CEFAZOLIN SODIUM 1-5 GM-% IV SOLN
1.0000 g | Freq: Three times a day (TID) | INTRAVENOUS | Status: AC
Start: 2015-05-11 — End: 2015-05-12
  Administered 2015-05-11 – 2015-05-12 (×2): 1 g via INTRAVENOUS
  Filled 2015-05-11 (×2): qty 50

## 2015-05-11 MED ORDER — HEPARIN SODIUM (PORCINE) 1000 UNIT/ML IJ SOLN
INTRAMUSCULAR | Status: AC
  Filled 2015-05-11: qty 1

## 2015-05-11 MED ORDER — ATORVASTATIN CALCIUM 10 MG PO TABS
10.0000 mg | ORAL_TABLET | Freq: Every day | ORAL | Status: DC
  Administered 2015-05-11 – 2015-05-12 (×2): 10 mg via ORAL
  Filled 2015-05-11 (×2): qty 1

## 2015-05-11 MED ORDER — FAMOTIDINE 20 MG PO TABS: 10.0000 mg | ORAL_TABLET | Freq: Every day | ORAL | Status: DC | PRN

## 2015-05-11 MED ORDER — ONDANSETRON HCL 4 MG/2ML IJ SOLN
INTRAMUSCULAR | Status: AC
  Filled 2015-05-11: qty 2

## 2015-05-11 MED ORDER — DEXAMETHASONE SODIUM PHOSPHATE 10 MG/ML IJ SOLN
INTRAMUSCULAR | Status: DC | PRN
  Administered 2015-05-11: 10 mg via INTRAVENOUS

## 2015-05-11 MED ORDER — INSULIN ASPART 100 UNIT/ML ~~LOC~~ SOLN
0.0000 [IU] | Freq: Three times a day (TID) | SUBCUTANEOUS | Status: DC
  Administered 2015-05-11 – 2015-05-12 (×2): 3 [IU] via SUBCUTANEOUS

## 2015-05-11 MED ORDER — FENTANYL CITRATE (PF) 250 MCG/5ML IJ SOLN
INTRAMUSCULAR | Status: AC
  Filled 2015-05-11: qty 25

## 2015-05-11 MED ORDER — HYDROCODONE-ACETAMINOPHEN 5-325 MG PO TABS: 1.0000 | ORAL_TABLET | Freq: Four times a day (QID) | ORAL | Status: DC | PRN

## 2015-05-11 SURGICAL SUPPLY — 48 items
CABLE HIGH FREQUENCY MONO STRZ (ELECTRODE) ×3 IMPLANT
CATH FOLEY 2WAY SLVR 18FR 30CC (CATHETERS) ×3 IMPLANT
CATH ROBINSON RED A/P 16FR (CATHETERS) ×3 IMPLANT
CATH ROBINSON RED A/P 8FR (CATHETERS) ×3 IMPLANT
CATH TIEMANN FOLEY 18FR 5CC (CATHETERS) ×3 IMPLANT
CHLORAPREP W/TINT 26ML (MISCELLANEOUS) ×3 IMPLANT
CLIP LIGATING HEM O LOK PURPLE (MISCELLANEOUS) ×3 IMPLANT
CLOTH BEACON ORANGE TIMEOUT ST (SAFETY) ×3 IMPLANT
COVER SURGICAL LIGHT HANDLE (MISCELLANEOUS) ×3 IMPLANT
COVER TIP SHEARS 8 DVNC (MISCELLANEOUS) ×1 IMPLANT
COVER TIP SHEARS 8MM DA VINCI (MISCELLANEOUS) ×2
CUTTER ECHEON FLEX ENDO 45 340 (ENDOMECHANICALS) ×3 IMPLANT
DECANTER SPIKE VIAL GLASS SM (MISCELLANEOUS) IMPLANT
DRAPE SURG IRRIG POUCH 19X23 (DRAPES) ×3 IMPLANT
DRSG TEGADERM 4X4.75 (GAUZE/BANDAGES/DRESSINGS) ×3 IMPLANT
DRSG TEGADERM 6X8 (GAUZE/BANDAGES/DRESSINGS) ×6 IMPLANT
ELECT REM PT RETURN 9FT ADLT (ELECTROSURGICAL) ×3
ELECTRODE REM PT RTRN 9FT ADLT (ELECTROSURGICAL) ×1 IMPLANT
GLOVE BIO SURGEON STRL SZ 6.5 (GLOVE) ×2 IMPLANT
GLOVE BIO SURGEONS STRL SZ 6.5 (GLOVE) ×1
GLOVE BIOGEL M STRL SZ7.5 (GLOVE) ×6 IMPLANT
GOWN STRL REUS W/TWL LRG LVL3 (GOWN DISPOSABLE) ×9 IMPLANT
HOLDER FOLEY CATH W/STRAP (MISCELLANEOUS) ×3 IMPLANT
IV LACTATED RINGERS 1000ML (IV SOLUTION) IMPLANT
KIT ACCESSORY DA VINCI DISP (KITS) ×2
KIT ACCESSORY DVNC DISP (KITS) ×1 IMPLANT
LIQUID BAND (GAUZE/BANDAGES/DRESSINGS) ×3 IMPLANT
NDL SAFETY ECLIPSE 18X1.5 (NEEDLE) ×1 IMPLANT
NEEDLE HYPO 18GX1.5 SHARP (NEEDLE) ×2
PACK ROBOT UROLOGY CUSTOM (CUSTOM PROCEDURE TRAY) ×3 IMPLANT
RELOAD GREEN ECHELON 45 (STAPLE) ×3 IMPLANT
SET TUBE IRRIG SUCTION NO TIP (IRRIGATION / IRRIGATOR) ×3 IMPLANT
SHEET LAVH (DRAPES) ×3 IMPLANT
SOLUTION ELECTROLUBE (MISCELLANEOUS) ×3 IMPLANT
SUT ETHILON 3 0 PS 1 (SUTURE) ×3 IMPLANT
SUT MNCRL 3 0 RB1 (SUTURE) ×1 IMPLANT
SUT MNCRL 3 0 VIOLET RB1 (SUTURE) ×1 IMPLANT
SUT MNCRL AB 4-0 PS2 18 (SUTURE) ×6 IMPLANT
SUT MONOCRYL 3 0 RB1 (SUTURE) ×4
SUT VIC AB 0 CT1 27 (SUTURE) ×4
SUT VIC AB 0 CT1 27XBRD ANTBC (SUTURE) ×2 IMPLANT
SUT VIC AB 2-0 SH 27 (SUTURE) ×2
SUT VIC AB 2-0 SH 27X BRD (SUTURE) ×1 IMPLANT
SUT VICRYL 0 UR6 27IN ABS (SUTURE) ×6 IMPLANT
SYR 27GX1/2 1ML LL SAFETY (SYRINGE) ×3 IMPLANT
TOWEL OR 17X26 10 PK STRL BLUE (TOWEL DISPOSABLE) ×3 IMPLANT
TOWEL OR NON WOVEN STRL DISP B (DISPOSABLE) ×3 IMPLANT
WATER STERILE IRR 1500ML POUR (IV SOLUTION) IMPLANT

## 2015-05-11 NOTE — Interval H&P Note (Signed)
History and Physical Interval Note:  05/11/2015 11:13 AM  Timothy [name]  has presented today for surgery, with the diagnosis of PROSTATE CANCER  The various methods of treatment have been discussed with the patient and family. After consideration of risks, benefits and other options for treatment, the patient has consented to  Procedure(s): ROBOTIC ASSISTED LAPAROSCOPIC RADICAL PROSTATECTOMY LEVEL 1 (N/A) as a surgical intervention .  The patient's history has been reviewed, patient examined, no change in status, stable for surgery.  I have reviewed the patient's chart and labs.  Questions were answered to the patient's satisfaction.     Takyra Cantrall,LES

## 2015-05-11 NOTE — Discharge Instructions (Signed)

## 2015-05-11 NOTE — Anesthesia Preprocedure Evaluation (Signed)
Anesthesia Evaluation  Patient identified by MRN, date of birth, ID band Patient awake    Reviewed: Allergy & Precautions, NPO status , Patient's Chart, lab work & pertinent test results  History of Anesthesia Complications Negative for: history of anesthetic complications  Airway Mallampati: II  TM Distance: <3 FB Neck ROM: Full    Dental  (+) Teeth Intact, Dental Advisory Given   Pulmonary neg pulmonary ROS,    Pulmonary exam normal breath sounds clear to auscultation       Cardiovascular Exercise Tolerance: Good (-) hypertension(-) angina(-) CAD negative cardio ROS Normal cardiovascular exam Rhythm:Regular Rate:Normal     Neuro/Psych negative neurological ROS  negative psych ROS   GI/Hepatic Neg liver ROS, GERD  Medicated and Controlled,  Endo/Other  diabetes, Well Controlled, Type 2, Oral Hypoglycemic Agents  Renal/GU negative Renal ROS     Musculoskeletal negative musculoskeletal ROS (+)   Abdominal   Peds  Hematology  (+) Blood dyscrasia, ,   Anesthesia Other Findings Day of surgery medications reviewed with the patient.  Reproductive/Obstetrics                             Anesthesia Physical Anesthesia Plan  ASA: II  Anesthesia Plan: General   Post-op Pain Management:    Induction: Intravenous  Airway Management Planned: Oral ETT  Additional Equipment:   Intra-op Plan:   Post-operative Plan: Extubation in OR  Informed Consent: I have reviewed the patients History and Physical, chart, labs and discussed the procedure including the risks, benefits and alternatives for the proposed anesthesia with the patient or authorized representative who has indicated his/her understanding and acceptance.   Dental advisory given  Plan Discussed with: CRNA  Anesthesia Plan Comments: (Risks/benefits of general anesthesia discussed with patient including risk of damage to teeth,  lips, gum, and tongue, nausea/vomiting, allergic reactions to medications, and the possibility of heart attack, stroke and death.  All patient questions answered.  Patient wishes to proceed.)        Anesthesia Quick Evaluation

## 2015-05-11 NOTE — Op Note (Signed)
Preoperative diagnosis: Clinically localized adenocarcinoma of the prostate (clinical stage T1c Nx Mx)  Postoperative diagnosis: Clinically localized adenocarcinoma of the prostate (clinical stage T1c Nx Mx)  Procedure:  1. Robotic assisted laparoscopic radical prostatectomy (bilateral nerve sparing)  Surgeon: Roxy Horseman, Brooke Bonito. M.D.  Assistant: Debbrah Alar, PA-C  Anesthesia: General  Complications: None  EBL: 50 mL  IVF:  900 mL crystalloid  Specimens: 1. Prostate and seminal vesicles  Disposition of specimens: Pathology  Drains: 1. 20 Fr coude catheter 2. # 19 Blake pelvic drain  Indication: Timothy [name] is a 59 y.o. year old patient with clinically localized prostate cancer.  After a thorough review of the management options for treatment of prostate cancer, he elected to proceed with surgical therapy and the above procedure(s).  We have discussed the potential benefits and risks of the procedure, side effects of the proposed treatment, the likelihood of the patient achieving the goals of the procedure, and any potential problems that might occur during the procedure or recuperation. Informed consent has been obtained.  Description of procedure:  The patient was taken to the operating room and a general anesthetic was administered. He was given preoperative antibiotics, placed in the dorsal lithotomy position, and prepped and draped in the usual sterile fashion. Next a preoperative timeout was performed. A urethral catheter was placed into the bladder and a site was selected near the umbilicus for placement of the camera port. This was placed using a standard open Hassan technique which allowed entry into the peritoneal cavity under direct vision and without difficulty. A 12 mm port was placed and a pneumoperitoneum established. The camera was then used to inspect the abdomen and there was no evidence of any intra-abdominal injuries or other abnormalities. The remaining  abdominal ports were then placed. 8 mm robotic ports were placed in the right lower quadrant, left lower quadrant, and far left lateral abdominal wall. A 5 mm port was placed in the right upper quadrant and a 12 mm port was placed in the right lateral abdominal wall for laparoscopic assistance. All ports were placed under direct vision without difficulty. The surgical cart was then docked.   Utilizing the cautery scissors, the bladder was reflected posteriorly allowing entry into the space of Retzius and identification of the endopelvic fascia and prostate. The periprostatic fat was then removed from the prostate allowing full exposure of the endopelvic fascia. The endopelvic fascia was then incised from the apex back to the base of the prostate bilaterally and the underlying levator muscle fibers were swept laterally off the prostate thereby isolating the dorsal venous complex. The dorsal vein was then stapled and divided with a 45 mm Flex Echelon stapler. Attention then turned to the bladder neck which was divided anteriorly thereby allowing entry into the bladder and exposure of the urethral catheter. The catheter balloon was deflated and the catheter was brought into the operative field and used to retract the prostate anteriorly. The posterior bladder neck was then examined and was divided allowing further dissection between the bladder and prostate posteriorly until the vasa deferentia and seminal vessels were identified. The vasa deferentia were isolated, divided, and lifted anteriorly. The seminal vesicles were dissected down to their tips with care to control the seminal vascular arterial blood supply. These structures were then lifted anteriorly and the space between Denonvillier's fascia and the anterior rectum was developed with a combination of sharp and blunt dissection. This isolated the vascular pedicles of the prostate.  The lateral prostatic  fascia was then sharply incised allowing release of  the neurovascular bundles bilaterally. The vascular pedicles of the prostate were then ligated with Weck clips between the prostate and neurovascular bundles and divided with sharp cold scissor dissection resulting in neurovascular bundle preservation. The neurovascular bundles were then separated off the apex of the prostate and urethra bilaterally.  The urethra was then sharply transected allowing the prostate specimen to be disarticulated. The pelvis was copiously irrigated and hemostasis was ensured. There was no evidence for rectal injury.  Attention then turned to the urethral anastomosis. A 2-0 Vicryl slip knot was placed between Denonvillier's fascia, the posterior bladder neck, and the posterior urethra to reapproximate these structures. A double-armed 3-0 Monocryl suture was then used to perform a 360 running tension-free anastomosis between the bladder neck and urethra. A new urethral catheter was then placed into the bladder and irrigated. There were no blood clots within the bladder and the anastomosis appeared to be watertight. A #19 Blake drain was then brought through the left lateral 8 mm port site and positioned appropriately within the pelvis. It was secured to the skin with a nylon suture. The surgical cart was then undocked. The right lateral 12 mm port site was closed at the fascial level with a 0 Vicryl suture placed laparoscopically. All remaining ports were then removed under direct vision. The prostate specimen was removed intact within the Endopouch retrieval bag via the periumbilical camera port site. This fascial opening was closed with two running 0 Vicryl sutures. 0.25% Marcaine was then injected into all port sites and all incisions were reapproximated at the skin level with 4-0 Monocryl subcuticular sutures and Dermabond. The patient appeared to tolerate the procedure well and without complications. The patient was able to be extubated and transferred to the recovery unit in  satisfactory condition.  Pryor Curia MD

## 2015-05-11 NOTE — Progress Notes (Signed)
Patient ID: Timothy [name], male   DOB: Apr 24, 1956, 59 y.o.   MRN: [id number]  Post-op note  Subjective: The patient is doing well.  No complaints.  Objective: Vital signs in last 24 hours: Temp:  [97.9 F (36.6 C)-98.7 F (37.1 C)] 98.3 F (36.8 C) (11/14 1803) Pulse Rate:  [65-74] 72 (11/14 1803) Resp:  [7-20] 20 (11/14 1803) BP: (110-146)/(57-68) 128/62 mmHg (11/14 1803) SpO2:  [98 %-100 %] 98 % (11/14 1803) Weight:  [83.8 kg (184 lb 11.9 oz)-85.276 kg (188 lb)] 83.8 kg (184 lb 11.9 oz) (11/14 1556)  Intake/Output from previous day:   Intake/Output this shift: Total I/O In: 3100 [I.V.:3000; IV Piggyback:100] Out: 100 [Urine:100]  Physical Exam:  General: Alert and oriented. Abdomen: Soft, Nondistended. Incisions: Clean and dry. GU: Urine reddish but draining well.  Lab Results:  Recent Labs  05/11/15 1428  HGB 15.0  HCT 42.3    Assessment/Plan: POD#0   1) Continue to monitor, ambulate, IS   Pryor Curia. MD   LOS: 0 days   Marijayne Rauth,LES 05/11/2015, 6:44 PM

## 2015-05-11 NOTE — Transfer of Care (Signed)
Immediate Anesthesia Transfer of Care Note  Patient: Timothy [name]  Procedure(s) Performed: Procedure(s): ROBOTIC ASSISTED LAPAROSCOPIC RADICAL PROSTATECTOMY LEVEL 1 (N/A)  Patient Location: PACU  Anesthesia Type:General  Level of Consciousness: awake, alert  and oriented  Airway & Oxygen Therapy: Patient Spontanous Breathing and Patient connected to face mask oxygen  Post-op Assessment: Report given to RN and Post -op Vital signs reviewed and stable  Post vital signs: Reviewed and stable  Last Vitals:  Filed Vitals:   05/11/15 0947  BP: 110/66  Pulse: 65  Temp: 36.6 C  Resp: 18    Complications: No apparent anesthesia complications

## 2015-05-11 NOTE — Anesthesia Procedure Notes (Signed)
Procedure Name: Intubation Date/Time: 05/11/2015 11:45 AM Performed by: Noralyn Pick D Pre-anesthesia Checklist: Patient identified, Emergency Drugs available, Suction available and Patient being monitored Patient Re-evaluated:Patient Re-evaluated prior to inductionOxygen Delivery Method: Circle System Utilized Preoxygenation: Pre-oxygenation with 100% oxygen Intubation Type: IV induction Ventilation: Mask ventilation without difficulty Laryngoscope Size: Mac and 4 Grade View: Grade III Tube type: Oral Tube size: 7.5 mm Number of attempts: 1 Airway Equipment and Method: Stylet and Oral airway Placement Confirmation: ETT inserted through vocal cords under direct vision,  positive ETCO2 and breath sounds checked- equal and bilateral Secured at: 22 cm Tube secured with: Tape Dental Injury: Teeth and Oropharynx as per pre-operative assessment

## 2015-05-12 LAB — HEMOGLOBIN AND HEMATOCRIT, BLOOD
HCT: 40.1 % (ref 39.0–52.0)
Hemoglobin: 14.2 g/dL (ref 13.0–17.0)

## 2015-05-12 LAB — GLUCOSE, CAPILLARY
GLUCOSE-CAPILLARY: 164 mg/dL — AB (ref 65–99)
GLUCOSE-CAPILLARY: 73 mg/dL (ref 65–99)
Glucose-Capillary: 129 mg/dL — ABNORMAL HIGH (ref 65–99)
Glucose-Capillary: 179 mg/dL — ABNORMAL HIGH (ref 65–99)

## 2015-05-12 MED ORDER — HYDROCODONE-ACETAMINOPHEN 5-325 MG PO TABS: 1.0000 | ORAL_TABLET | Freq: Four times a day (QID) | ORAL | Status: DC | PRN

## 2015-05-12 MED ORDER — BISACODYL 10 MG RE SUPP
10.0000 mg | Freq: Once | RECTAL | Status: AC
  Administered 2015-05-12: 10 mg via RECTAL
  Filled 2015-05-12: qty 1

## 2015-05-12 NOTE — Discharge Summary (Signed)
  Date of admission: 05/11/2015  Date of discharge: 05/12/2015  Admission diagnosis: Prostate Cancer  Discharge diagnosis: Prostate Cancer  History and Physical: For full details, please see admission history and physical. Briefly, Timothy [name] is a 59 y.o. gentleman with localized prostate cancer.  After discussing management/treatment options, he elected to proceed with surgical treatment.  Hospital Course: TERRALL BLEY was taken to the operating room on 05/11/2015 and underwent a robotic assisted laparoscopic radical prostatectomy. He tolerated this procedure well and without complications. Postoperatively, he was able to be transferred to a regular hospital room following recovery from anesthesia.  He was able to begin ambulating the night of surgery. He remained hemodynamically stable overnight.  He had excellent urine output with appropriately minimal output from his pelvic drain and his pelvic drain was removed on POD #1.  He was transitioned to oral pain medication, tolerated a clear liquid diet, and had met all discharge criteria and was able to be discharged home later on POD#1.  Laboratory values:  Recent Labs  05/11/15 1428 05/12/15 0424  HGB 15.0 14.2  HCT 42.3 40.1    Disposition: Home  Discharge instruction: He was instructed to be ambulatory but to refrain from heavy lifting, strenuous activity, or driving. He was instructed on urethral catheter care.  Discharge medications:     Medication List    STOP taking these medications        ADULT ASPIRIN EC LOW STRENGTH 81 MG EC tablet  Generic drug:  aspirin     beta carotene w/minerals tablet     Fish Oil 1000 MG Caps     ibuprofen 200 MG tablet  Commonly known as:  ADVIL,MOTRIN     MULTIVITAMINS PO     tamsulosin 0.4 MG Caps capsule  Commonly known as:  FLOMAX     Vitamin D3 1000 UNITS Caps      TAKE these medications        atorvastatin 10 MG tablet  Commonly known as:  LIPITOR  Take 10 mg by  mouth daily.     cetirizine 10 MG tablet  Commonly known as:  ZYRTEC  Take 10 mg by mouth daily.     famotidine 20 MG tablet  Commonly known as:  PEPCID  Take 10 mg by mouth daily as needed for heartburn or indigestion.     fluticasone 50 MCG/ACT nasal spray  Commonly known as:  FLONASE  Place 2 sprays into both nostrils daily.     freestyle lancets  1 each daily as needed.     FREESTYLE TEST STRIPS test strip  Generic drug:  glucose blood  1 each daily as needed.     HYDROcodone-acetaminophen 5-325 MG tablet  Commonly known as:  NORCO  Take 1-2 tablets by mouth every 6 (six) hours as needed.     metFORMIN 500 MG tablet  Commonly known as:  GLUCOPHAGE  Take 1,000 mg by mouth daily with breakfast.     sulfamethoxazole-trimethoprim 800-160 MG tablet  Commonly known as:  BACTRIM DS,SEPTRA DS  Take 1 tablet by mouth 2 (two) times daily. Start the day prior to foley removal appointment        Followup: He will followup in 1 week for catheter removal and to discuss his surgical pathology results.

## 2015-05-12 NOTE — Care Management Note (Signed)
Case Management Note  Patient Details  Name: Timothy [name] MRN: [id number] Date of Birth: [birth year]  Subjective/Objective:                 prostatectomy   Action/Plan:   Expected Discharge Date:    Date: May 12, 2015 Chart reviewed for concurrent status and case management needs. Will continue to follow patient for changes and needs: Velva Harman, RN, BSN, [place]   [phone]               Expected Discharge Plan:  Home/Self Care  In-House Referral:  NA  Discharge planning Services  CM Consult  Post Acute Care Choice:  NA Choice offered to:  NA  DME Arranged:  N/A DME Agency:  NA  HH Arranged:  NA HH Agency:  NA  Status of Service:  In process, will continue to follow  Medicare Important Message Given:    Date Medicare IM Given:    Medicare IM give by:    Date Additional Medicare IM Given:    Additional Medicare Important Message give by:     If discussed at [place] of Stay Meetings, dates discussed:    Additional Comments:  Leeroy Cha, RN 05/12/2015, 9:29 AM

## 2015-05-12 NOTE — Anesthesia Postprocedure Evaluation (Signed)
  Anesthesia Post-op Note  Patient: Timothy [name]  Procedure(s) Performed: Procedure(s) (LRB): ROBOTIC ASSISTED LAPAROSCOPIC RADICAL PROSTATECTOMY LEVEL 1 (N/A)  Patient Location: PACU  Anesthesia Type: General  Level of Consciousness: awake and alert   Airway and Oxygen Therapy: Patient Spontanous Breathing  Post-op Pain: mild  Post-op Assessment: Post-op Vital signs reviewed, Patient's Cardiovascular Status Stable, Respiratory Function Stable, Patent Airway and No signs of Nausea or vomiting  Last Vitals:  Filed Vitals:   05/12/15 0705  BP: 115/59  Pulse: 62  Temp: 36.7 C  Resp: 18    Post-op Vital Signs: stable   Complications: No apparent anesthesia complications

## 2015-05-12 NOTE — Progress Notes (Signed)
Patient ID: Timothy [name], male   DOB: 1955-12-21, 59 y.o.   MRN: [id number]  1 Day Post-Op Subjective: The patient is doing well.  No nausea or vomiting. Pain is adequately controlled.  Objective: Vital signs in last 24 hours: Temp:  [97.8 F (36.6 C)-98.7 F (37.1 C)] 98.2 F (36.8 C) (11/15 0210) Pulse Rate:  [63-74] 63 (11/15 0210) Resp:  [7-20] 18 (11/15 0210) BP: (110-146)/(57-68) 115/58 mmHg (11/15 0210) SpO2:  [95 %-100 %] 95 % (11/15 0210) Weight:  [83.8 kg (184 lb 11.9 oz)-85.276 kg (188 lb)] 83.8 kg (184 lb 11.9 oz) (11/14 1556)  Intake/Output from previous day: 11/14 0701 - 11/15 0700 In: 4117.5 [I.V.:3517.5; IV Piggyback:200] Out: 2385 [Urine:2375; Drains:10] Intake/Output this shift: Total I/O In: 617.5 [I.V.:517.5; IV Piggyback:100] Out: 2285 [Urine:2275; Drains:10]  Physical Exam:  General: Alert and oriented. GI: Soft, Nondistended. Positive BS. Incisions: Clean, dry, and intact Urine: Clear Extremities: Nontender, no erythema, no edema.  Lab Results:  Recent Labs  05/11/15 1428 05/12/15 0424  HGB 15.0 14.2  HCT 42.3 40.1      Assessment/Plan: POD# 1 s/p robotic prostatectomy.  1) SL IVF 2) Ambulate, Incentive spirometry 3) Transition to oral pain medication 4) Dulcolax suppository 5) D/C pelvic drain 6) Plan for likely discharge later today   Pryor Curia. MD   LOS: 1 day   Fedora Knisely,LES 05/12/2015, 6:41 AM

## 2015-06-30 DIAGNOSIS — C61 Malignant neoplasm of prostate: Secondary | ICD-10-CM | POA: Diagnosis not present

## 2015-06-30 DIAGNOSIS — N393 Stress incontinence (female) (male): Secondary | ICD-10-CM | POA: Diagnosis not present

## 2015-06-30 DIAGNOSIS — M6281 Muscle weakness (generalized): Secondary | ICD-10-CM | POA: Diagnosis not present

## 2015-07-03 DIAGNOSIS — C61 Malignant neoplasm of prostate: Secondary | ICD-10-CM | POA: Diagnosis not present

## 2015-07-03 DIAGNOSIS — N393 Stress incontinence (female) (male): Secondary | ICD-10-CM | POA: Diagnosis not present

## 2015-07-27 MED FILL — metFORMIN HCL 500 MG TABS: 500 | 90 days supply | Qty: 180 | Fill #3

## 2015-07-27 MED FILL — ATORVASTATIN 10 MG TABLET: 10 | 90 days supply | Qty: 90 | Fill #3

## 2015-10-02 DIAGNOSIS — Z7984 Long term (current) use of oral hypoglycemic drugs: Secondary | ICD-10-CM | POA: Diagnosis not present

## 2015-10-02 DIAGNOSIS — Z0000 Encounter for general adult medical examination without abnormal findings: Secondary | ICD-10-CM | POA: Diagnosis not present

## 2015-10-02 DIAGNOSIS — Z23 Encounter for immunization: Secondary | ICD-10-CM | POA: Diagnosis not present

## 2015-10-02 DIAGNOSIS — E119 Type 2 diabetes mellitus without complications: Secondary | ICD-10-CM | POA: Diagnosis not present

## 2015-10-02 DIAGNOSIS — N5231 Erectile dysfunction following radical prostatectomy: Secondary | ICD-10-CM | POA: Diagnosis not present

## 2015-10-02 DIAGNOSIS — C61 Malignant neoplasm of prostate: Secondary | ICD-10-CM | POA: Diagnosis not present

## 2015-10-02 DIAGNOSIS — E7800 Pure hypercholesterolemia, unspecified: Secondary | ICD-10-CM | POA: Diagnosis not present

## 2015-10-02 MED FILL — VIAGRA 100 MG TABLET: 100 | 30 days supply | Qty: 6 | Fill #0

## 2015-10-21 MED FILL — metFORMIN HCL 500 MG TABS: 500 | 90 days supply | Qty: 180 | Fill #0

## 2015-10-21 MED FILL — ATORVASTATIN 10 MG TABLET: 10 | 90 days supply | Qty: 90 | Fill #0

## 2015-11-02 DIAGNOSIS — H5213 Myopia, bilateral: Secondary | ICD-10-CM | POA: Diagnosis not present

## 2015-11-02 DIAGNOSIS — E119 Type 2 diabetes mellitus without complications: Secondary | ICD-10-CM | POA: Diagnosis not present

## 2015-11-02 DIAGNOSIS — H52223 Regular astigmatism, bilateral: Secondary | ICD-10-CM | POA: Diagnosis not present

## 2015-11-02 DIAGNOSIS — H2513 Age-related nuclear cataract, bilateral: Secondary | ICD-10-CM | POA: Diagnosis not present

## 2015-11-02 DIAGNOSIS — H524 Presbyopia: Secondary | ICD-10-CM | POA: Diagnosis not present

## 2015-11-02 DIAGNOSIS — Z7984 Long term (current) use of oral hypoglycemic drugs: Secondary | ICD-10-CM | POA: Diagnosis not present

## 2016-01-14 DIAGNOSIS — C61 Malignant neoplasm of prostate: Secondary | ICD-10-CM | POA: Diagnosis not present

## 2016-01-18 MED FILL — metFORMIN HCL 500 MG TABS: 500 | 90 days supply | Qty: 180 | Fill #1

## 2016-01-18 MED FILL — ATORVASTATIN 10 MG TABLET: 10 | 90 days supply | Qty: 90 | Fill #1

## 2016-01-20 DIAGNOSIS — C61 Malignant neoplasm of prostate: Secondary | ICD-10-CM | POA: Diagnosis not present

## 2016-01-20 DIAGNOSIS — N5201 Erectile dysfunction due to arterial insufficiency: Secondary | ICD-10-CM | POA: Diagnosis not present

## 2016-04-01 DIAGNOSIS — E119 Type 2 diabetes mellitus without complications: Secondary | ICD-10-CM | POA: Diagnosis not present

## 2016-04-01 DIAGNOSIS — Z8546 Personal history of malignant neoplasm of prostate: Secondary | ICD-10-CM | POA: Diagnosis not present

## 2016-04-01 DIAGNOSIS — Z7984 Long term (current) use of oral hypoglycemic drugs: Secondary | ICD-10-CM | POA: Diagnosis not present

## 2016-04-01 MED FILL — metFORMIN HCL 500 MG TABS: 500 | 90 days supply | Qty: 180 | Fill #2

## 2016-06-07 MED FILL — ATORVASTATIN 10 MG TABLET: 10 | 90 days supply | Qty: 90 | Fill #2

## 2016-06-23 MED FILL — metFORMIN HCL 500 MG TABS: 500 | 90 days supply | Qty: 180 | Fill #3

## 2016-07-18 MED FILL — ACCU-CHEK FASTCLIX LANCETS: 90 days supply | Qty: 102 | Fill #0

## 2016-07-18 MED FILL — ACCU-CHEK GUIDE TEST STRIP: 90 days supply | Qty: 100 | Fill #0

## 2016-07-20 DIAGNOSIS — Z8546 Personal history of malignant neoplasm of prostate: Secondary | ICD-10-CM | POA: Diagnosis not present

## 2016-07-27 DIAGNOSIS — Z8546 Personal history of malignant neoplasm of prostate: Secondary | ICD-10-CM | POA: Diagnosis not present

## 2016-07-27 DIAGNOSIS — N5201 Erectile dysfunction due to arterial insufficiency: Secondary | ICD-10-CM | POA: Diagnosis not present

## 2016-09-12 MED FILL — ATORVASTATIN 10 MG TABLET: 10 | 90 days supply | Qty: 90 | Fill #3

## 2016-09-12 MED FILL — metFORMIN HCL 500 MG TABS: 500 | 90 days supply | Qty: 180 | Fill #0

## 2016-10-03 DIAGNOSIS — D696 Thrombocytopenia, unspecified: Secondary | ICD-10-CM | POA: Diagnosis not present

## 2016-10-03 DIAGNOSIS — E7800 Pure hypercholesterolemia, unspecified: Secondary | ICD-10-CM | POA: Diagnosis not present

## 2016-10-03 DIAGNOSIS — E1165 Type 2 diabetes mellitus with hyperglycemia: Secondary | ICD-10-CM | POA: Diagnosis not present

## 2016-10-03 DIAGNOSIS — Z0000 Encounter for general adult medical examination without abnormal findings: Secondary | ICD-10-CM | POA: Diagnosis not present

## 2016-10-03 DIAGNOSIS — E119 Type 2 diabetes mellitus without complications: Secondary | ICD-10-CM | POA: Diagnosis not present

## 2016-10-03 DIAGNOSIS — E559 Vitamin D deficiency, unspecified: Secondary | ICD-10-CM | POA: Diagnosis not present

## 2016-10-03 DIAGNOSIS — Z7984 Long term (current) use of oral hypoglycemic drugs: Secondary | ICD-10-CM | POA: Diagnosis not present

## 2016-10-03 MED FILL — ACCU-CHEK GUIDE TEST STRIP: 90 days supply | Qty: 200 | Fill #0

## 2016-10-10 ENCOUNTER — Encounter: Payer: Self-pay | Admitting: Internal Medicine

## 2016-11-17 DIAGNOSIS — L570 Actinic keratosis: Secondary | ICD-10-CM | POA: Diagnosis not present

## 2016-11-17 DIAGNOSIS — L239 Allergic contact dermatitis, unspecified cause: Secondary | ICD-10-CM | POA: Diagnosis not present

## 2016-11-17 DIAGNOSIS — L578 Other skin changes due to chronic exposure to nonionizing radiation: Secondary | ICD-10-CM | POA: Diagnosis not present

## 2016-11-17 DIAGNOSIS — D1801 Hemangioma of skin and subcutaneous tissue: Secondary | ICD-10-CM | POA: Diagnosis not present

## 2016-11-17 DIAGNOSIS — Z85828 Personal history of other malignant neoplasm of skin: Secondary | ICD-10-CM | POA: Diagnosis not present

## 2016-11-17 DIAGNOSIS — I788 Other diseases of capillaries: Secondary | ICD-10-CM | POA: Diagnosis not present

## 2016-11-17 DIAGNOSIS — D225 Melanocytic nevi of trunk: Secondary | ICD-10-CM | POA: Diagnosis not present

## 2016-11-17 DIAGNOSIS — L821 Other seborrheic keratosis: Secondary | ICD-10-CM | POA: Diagnosis not present

## 2016-11-17 DIAGNOSIS — L812 Freckles: Secondary | ICD-10-CM | POA: Diagnosis not present

## 2016-12-02 ENCOUNTER — Ambulatory Visit (AMBULATORY_SURGERY_CENTER): Payer: Self-pay | Admitting: *Deleted

## 2016-12-02 VITALS — Ht 72.0 in | Wt 182.4 lb

## 2016-12-02 DIAGNOSIS — Z1211 Encounter for screening for malignant neoplasm of colon: Secondary | ICD-10-CM

## 2016-12-02 NOTE — Progress Notes (Signed)
No egg or soy allergy known to patient  No issues with past sedation with any surgeries  or procedures, no intubation problems  No diet pills per patient No home 02 use per patient  No blood thinners per patient  Pt denies issues with constipation  No A fib or A flutter  EMMI video sent to pt's e mail - pt declined this video  Pt rescheduled colon date  in [place] today

## 2016-12-06 MED FILL — metFORMIN HCL 500 MG TABS: 500 | 90 days supply | Qty: 180 | Fill #1

## 2016-12-06 MED FILL — ATORVASTATIN 10 MG TABLET: 10 | 90 days supply | Qty: 90 | Fill #0

## 2016-12-16 ENCOUNTER — Encounter: Payer: 59 | Admitting: Internal Medicine

## 2016-12-21 ENCOUNTER — Ambulatory Visit (INDEPENDENT_AMBULATORY_CARE_PROVIDER_SITE_OTHER): Payer: Self-pay | Admitting: Nurse Practitioner

## 2016-12-21 VITALS — BP 100/54 | HR 86 | Temp 98.4°F | Resp 16

## 2016-12-21 DIAGNOSIS — J209 Acute bronchitis, unspecified: Secondary | ICD-10-CM

## 2016-12-21 MED ORDER — AZITHROMYCIN 250 MG PO TABS: ORAL_TABLET | ORAL | 0 refills | Status: AC

## 2016-12-21 MED ORDER — BENZONATATE 100 MG PO CAPS: 100.0000 mg | ORAL_CAPSULE | Freq: Three times a day (TID) | ORAL | 0 refills | Status: AC | PRN

## 2016-12-21 MED FILL — BENZONATATE 100 MG CAP: 100 | 10 days supply | Qty: 30 | Fill #0

## 2016-12-21 MED FILL — AZITHROMYCIN 250 MG TABLET: 250 | 5 days supply | Qty: 6 | Fill #0

## 2016-12-21 NOTE — Progress Notes (Signed)
Subjective:     Timothy [name] is a 61 y.o. male who presents for evaluation of nasal congestion, productive cough, sore throat and sinus pressure, pain in right upper chest. . Patient has increased fatigue and states, "I just feel terrible". Symptoms began 4 days ago. Symptoms have been gradually worsening since that time. Past history is significant for pneumonia.  Patient has received the pneumoccocal vaccine and flu shot is up to date.   Denies fever, SOB, wheezing, or DOE.  Has been taking Mucinex, cough worsens throughout the day.    The following portions of the patient's history were reviewed and updated as appropriate: allergies, current medications and past medical history.  Review of Systems Constitutional: positive for fatigue Eyes: negative Ears, nose, mouth, throat, and face: positive for nasal congestion and sore throat, negative for earaches Respiratory: positive for cough and green sputum production, negative for wheezing Cardiovascular: negative Allergic/Immunologic: positive for hay fever    Objective:    BP (!) 100/54 (BP Location: Left Arm, Patient Position: Sitting, Cuff Size: Normal)   Pulse 86   Temp 98.4 F (36.9 C) (Oral)   Resp 16   SpO2 96%  General appearance: alert, cooperative and fatigued Head: Normocephalic, without obvious abnormality, atraumatic Eyes: conjunctivae/corneas clear. PERRL, EOM's intact. Fundi benign. Ears: normal TM's and external ear canals both ears and cerumen build up bilaterally Nose: no discharge, turbinates pink Throat: lips, mucosa, and tongue normal; teeth and gums normal Lungs: clear to auscultation bilaterally Heart: regular rate and rhythm, S1, S2 normal, no murmur, click, rub or gallop Skin: Skin color, texture, turgor normal. No rashes or lesions Neurologic: Grossly normal    Assessment:    Acute Bronchitis    Plan:    Worsening signs and symptoms discussed. Rest, fluids, acetaminophen, and humidification. Take  antibiotic as prescribed.  Cough medicine as needed.   Follow up as needed for persistent, worsening cough, or appearance of new symptoms.

## 2016-12-21 NOTE — Patient Instructions (Addendum)

## 2016-12-23 ENCOUNTER — Telehealth: Payer: Self-pay | Admitting: *Deleted

## 2016-12-23 NOTE — Telephone Encounter (Signed)
Called pt to see if he was feeling better.  He was seen on 6/27.  Pt stated that he was doing some better and there was nothing further that he needed.

## 2017-01-03 ENCOUNTER — Encounter: Payer: Self-pay | Admitting: Internal Medicine

## 2017-01-12 ENCOUNTER — Encounter: Payer: Self-pay | Admitting: Internal Medicine

## 2017-01-12 ENCOUNTER — Ambulatory Visit (AMBULATORY_SURGERY_CENTER): Payer: 59 | Admitting: Internal Medicine

## 2017-01-12 VITALS — BP 101/59 | HR 49 | Temp 96.6°F | Resp 14 | Ht 72.0 in | Wt 182.0 lb

## 2017-01-12 DIAGNOSIS — D123 Benign neoplasm of transverse colon: Secondary | ICD-10-CM

## 2017-01-12 DIAGNOSIS — Z1211 Encounter for screening for malignant neoplasm of colon: Secondary | ICD-10-CM | POA: Diagnosis present

## 2017-01-12 DIAGNOSIS — Z1212 Encounter for screening for malignant neoplasm of rectum: Secondary | ICD-10-CM

## 2017-01-12 DIAGNOSIS — D129 Benign neoplasm of anus and anal canal: Secondary | ICD-10-CM

## 2017-01-12 DIAGNOSIS — E119 Type 2 diabetes mellitus without complications: Secondary | ICD-10-CM | POA: Diagnosis not present

## 2017-01-12 DIAGNOSIS — K219 Gastro-esophageal reflux disease without esophagitis: Secondary | ICD-10-CM | POA: Diagnosis not present

## 2017-01-12 DIAGNOSIS — D128 Benign neoplasm of rectum: Secondary | ICD-10-CM | POA: Diagnosis not present

## 2017-01-12 MED ORDER — SODIUM CHLORIDE 0.9 % IV SOLN: 500.0000 mL | INTRAVENOUS | Status: AC

## 2017-01-12 NOTE — Patient Instructions (Addendum)
I found and removed 3 diminutive polyps. I will let you know pathology results and when to have another routine colonoscopy by mail and/or My Chart.  I also saw scattered diverticulosis in the left colon.  I appreciate the opportunity to care for you.  Gatha Mayer, MD, FACG    YOU HAD AN ENDOSCOPIC PROCEDURE TODAY AT [facility]:   Refer to the procedure report that was given to you for any specific questions about what was found during the examination.  If the procedure report does not answer your questions, please call your gastroenterologist to clarify.  If you requested that your care partner not be given the details of your procedure findings, then the procedure report has been included in a sealed envelope for you to review at your convenience later.  YOU SHOULD EXPECT: Some feelings of bloating in the abdomen. Passage of more gas than usual.  Walking can help get rid of the air that was put into your GI tract during the procedure and reduce the bloating. If you had a lower endoscopy (such as a colonoscopy or flexible sigmoidoscopy) you may notice spotting of blood in your stool or on the toilet paper. If you underwent a bowel prep for your procedure, you may not have a normal bowel movement for a few days.  Please Note:  You might notice some irritation and congestion in your nose or some drainage.  This is from the oxygen used during your procedure.  There is no need for concern and it should clear up in a day or so.  SYMPTOMS TO REPORT IMMEDIATELY:   Following lower endoscopy (colonoscopy or flexible sigmoidoscopy):  Excessive amounts of blood in the stool  Significant tenderness or worsening of abdominal pains  Swelling of the abdomen that is new, acute  Fever of 100F or higher    For urgent or emergent issues, a gastroenterologist can be reached at any hour by calling [phone].   DIET:  We do recommend a small meal at first, but then you  may proceed to your regular diet.  Drink plenty of fluids but you should avoid alcoholic beverages for 24 hours.  ACTIVITY:  You should plan to take it easy for the rest of today and you should NOT DRIVE or use heavy machinery until tomorrow (because of the sedation medicines used during the test).    FOLLOW UP: Our staff will call the number listed on your records the next business day following your procedure to check on you and address any questions or concerns that you may have regarding the information given to you following your procedure. If we do not reach you, we will leave a message.  However, if you are feeling well and you are not experiencing any problems, there is no need to return our call.  We will assume that you have returned to your regular daily activities without incident.  If any biopsies were taken you will be contacted by phone or by letter within the next 1-3 weeks.  Please call us at [phone] if you have not heard about the biopsies in 3 weeks.    SIGNATURES/CONFIDENTIALITY: You and/or your care partner have signed paperwork which will be entered into your electronic medical record.  These signatures attest to the fact that that the information above on your After Visit Summary has been reviewed and is understood.  Full responsibility of the confidentiality of this discharge information lies with you and/or your care-partner.  INFORMATION ON POLYPS & DIVERTICULOSIS GIVEN TO YOU TODAY

## 2017-01-12 NOTE — Progress Notes (Signed)
Called to room to assist during endoscopic procedure.  Patient ID and intended procedure confirmed with present staff. Received instructions for my participation in the procedure from the performing physician.  

## 2017-01-12 NOTE — Op Note (Signed)
[facility] Patient Name: Timothy [name] Procedure Date: 01/12/2017 3:19 PM MRN: [id number] Endoscopist: Gatha Mayer , MD Age: 61 Referring MD:  Date of Birth: [birth year] Gender: Male Account #: [id number] Procedure:                Colonoscopy Indications:              Last colonoscopy: 2008 Medicines:                Propofol per Anesthesia, Monitored Anesthesia Care Procedure:                Pre-Anesthesia Assessment:                           - Prior to the procedure, a History and Physical                            was performed, and patient medications and                            allergies were reviewed. The patient's tolerance of                            previous anesthesia was also reviewed. The risks                            and benefits of the procedure and the sedation                            options and risks were discussed with the patient.                            All questions were answered, and informed consent                            was obtained. Prior Anticoagulants: The patient has                            taken no previous anticoagulant or antiplatelet                            agents. ASA Grade Assessment: II - A patient with                            mild systemic disease. After reviewing the risks                            and benefits, the patient was deemed in                            satisfactory condition to undergo the procedure.                           After obtaining informed consent, the colonoscope  was passed under direct vision. Throughout the                            procedure, the patient's blood pressure, pulse, and                            oxygen saturations were monitored continuously. The                            Colonoscope was introduced through the anus and                            advanced to the the cecum, identified by                            appendiceal orifice and  ileocecal valve. The                            patient tolerated the procedure well. The quality                            of the bowel preparation was good. The ileocecal                            valve, appendiceal orifice, and rectum were                            photographed. The colonoscopy was somewhat                            difficult due to significant looping. Successful                            completion of the procedure was aided by applying                            abdominal pressure. The bowel preparation used was                            Miralax. Scope In: 3:27:05 PM Scope Out: 3:54:11 PM Scope Withdrawal Time: 0 hours 19 minutes 42 seconds  Total Procedure Duration: 0 hours 27 minutes 6 seconds  Findings:                 The perianal examination was normal.                           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                           Three sessile polyps were found in the rectum and  transverse colon. The polyps were diminutive in                            size. These polyps were removed with a cold snare.                            Resection and retrieval were complete. Verification                            of patient identification for the specimen was                            done. Estimated blood loss was minimal.                           Scattered large-mouthed diverticula were found in                            the left colon.                           The exam was otherwise without abnormality on                            direct and retroflexion views. Complications:            No immediate complications. Estimated Blood Loss:     Estimated blood loss was minimal. 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                           - Three diminutive polyps in the rectum and in  the                            transverse colon, removed with a cold snare.                            Resected and retrieved.                           - Mild diverticulosis in the left colon.                           - The examination was otherwise normal on direct                            and retroflexion views. Recommendation:           - Patient has a contact number available for                            emergencies. The signs and symptoms of potential  delayed complications were discussed with the                            patient. Return to normal activities tomorrow.                            Written discharge instructions were provided to the                            patient.                           - Resume previous diet.                           - Continue present medications.                           - Repeat colonoscopy is recommended. The                            colonoscopy date will be determined after pathology                            results from today's exam become available for                            review. Gatha Mayer, MD 01/12/2017 4:02:07 PM This report has been signed electronically.

## 2017-01-12 NOTE — Progress Notes (Signed)
Report to PACU, RN, vss, BBS= Clear.  

## 2017-01-12 NOTE — Progress Notes (Signed)
Pt's states no medical or surgical changes since previsit or office visit. 

## 2017-01-13 ENCOUNTER — Telehealth: Payer: Self-pay

## 2017-01-13 NOTE — Telephone Encounter (Signed)
Patient calling back to let the doctor know he is fine.

## 2017-01-13 NOTE — Telephone Encounter (Signed)
Called # and left a messaged we tried to reach pt for a follow up call. maw 

## 2017-01-13 NOTE — Telephone Encounter (Signed)
Called #[phone] and left a messaged we tried to reach pt for a follow up call. maw

## 2017-01-18 ENCOUNTER — Encounter: Payer: Self-pay | Admitting: Internal Medicine

## 2017-01-18 DIAGNOSIS — Z86010 Personal history of colonic polyps: Secondary | ICD-10-CM

## 2017-01-18 DIAGNOSIS — Z860101 Personal history of adenomatous and serrated colon polyps: Secondary | ICD-10-CM

## 2017-01-18 HISTORY — DX: Personal history of colonic polyps: Z86.010

## 2017-01-18 HISTORY — DX: Personal history of adenomatous and serrated colon polyps: Z86.0101

## 2017-01-18 NOTE — Progress Notes (Signed)
3 diminutive adenomas Recall colonoscopy 2021 My Chart letter

## 2017-01-24 DIAGNOSIS — Z8546 Personal history of malignant neoplasm of prostate: Secondary | ICD-10-CM | POA: Diagnosis not present

## 2017-02-07 MED FILL — ACCU-CHEK FASTCLIX LANCETS: 90 days supply | Qty: 102 | Fill #1

## 2017-02-20 MED FILL — metFORMIN HCL 500 MG TABS: 500 | 90 days supply | Qty: 180 | Fill #2

## 2017-02-20 MED FILL — ATORVASTATIN 10 MG TABLET: 10 | 90 days supply | Qty: 90 | Fill #1

## 2017-04-05 DIAGNOSIS — Z7984 Long term (current) use of oral hypoglycemic drugs: Secondary | ICD-10-CM | POA: Diagnosis not present

## 2017-04-05 DIAGNOSIS — E1165 Type 2 diabetes mellitus with hyperglycemia: Secondary | ICD-10-CM | POA: Diagnosis not present

## 2017-04-05 DIAGNOSIS — E119 Type 2 diabetes mellitus without complications: Secondary | ICD-10-CM | POA: Diagnosis not present

## 2017-05-15 MED FILL — ATORVASTATIN 10 MG TABLET: 10 | 90 days supply | Qty: 90 | Fill #2

## 2017-05-15 MED FILL — metFORMIN HCL 500 MG TABS: 500 | 90 days supply | Qty: 180 | Fill #3

## 2017-07-07 MED FILL — ACCU-CHEK GUIDE TEST STRIP: 90 days supply | Qty: 200 | Fill #1

## 2017-07-07 MED FILL — ACCU-CHEK FASTCLIX LANCETS: 90 days supply | Qty: 102 | Fill #2

## 2017-07-20 DIAGNOSIS — Z8546 Personal history of malignant neoplasm of prostate: Secondary | ICD-10-CM | POA: Diagnosis not present

## 2017-07-27 MED FILL — TRULICITY 0.75 MG/0.5 ML PE: 0.75 | 28 days supply | Qty: 2 | Fill #0

## 2017-08-09 DIAGNOSIS — N5201 Erectile dysfunction due to arterial insufficiency: Secondary | ICD-10-CM | POA: Diagnosis not present

## 2017-08-09 DIAGNOSIS — Z8546 Personal history of malignant neoplasm of prostate: Secondary | ICD-10-CM | POA: Diagnosis not present

## 2017-08-15 MED FILL — metFORMIN HCL 500 MG TABS: 500 | 90 days supply | Qty: 180 | Fill #0

## 2017-08-15 MED FILL — ATORVASTATIN 10 MG TABLET: 10 | 90 days supply | Qty: 90 | Fill #3

## 2017-08-22 MED FILL — TRULICITY 0.75 MG/0.5 ML PE: 0.75 | 28 days supply | Qty: 2 | Fill #1

## 2017-09-12 ENCOUNTER — Ambulatory Visit (INDEPENDENT_AMBULATORY_CARE_PROVIDER_SITE_OTHER): Payer: 59 | Admitting: Sports Medicine

## 2017-09-12 VITALS — BP 112/72 | Ht 72.0 in | Wt 184.0 lb

## 2017-09-12 DIAGNOSIS — M25562 Pain in left knee: Secondary | ICD-10-CM

## 2017-09-13 ENCOUNTER — Encounter: Payer: Self-pay | Admitting: Sports Medicine

## 2017-09-13 NOTE — Progress Notes (Signed)
   Subjective:    Patient ID: Timothy [name], male    DOB: [birth year], 62 y.o.   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 He slipped while hiking hyperflexing his left knee. He developed some swelling about 24 hours later. Since that time, he has had intermittent pain and swelling that is present primarily with running. He has tried modifying his activity which has helped somewhat but each time he returns to running he gets swelling and pain. Pain is diffuse and deep within the knee. He also endorses some instability in the knee. He denies locking of the knee. Denies problems with this knee in the past. 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 He improved with over-the-counter Aleve. He was also given a body helix patellar strap at that time. His current pain in his left knee is different than what he experienced in the right knee. He denies any prior knee surgeries. Denies pain with walking.  Interim medical history reviewed Medications reviewed Allergies reviewed    Review of Systems As above    Objective:   Physical Exam  Well-developed, well-nourished. No acute distress. Vital signs reviewed  Left knee: Good range of motion. Trace effusion. Negative patellar grind.Knee is stable to valgus and varus stressing. Negative Lachman, negative anterior drawer. Negative posterior drawer. Negative lever test. No tenderness to palpation along medial or lateral joint lines but he does have lateral joint pain with Thessaly's. Neurovascularly intact distally. Walking without significant limp.  MSK ultrasound of the left knee was performed. Patient has a small effusion. There is also some edema around the lateral meniscus with some bulging from the lateral joint line. There is also a small hypoechoic change within the midportion of the  lateral meniscus which could be consistent with a small tear here. Visualized portion of the medial meniscus was unremarkable.      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 I would like for him to cross train and avoid running for the next 3 weeks. He will start isometric quad strengthening and half squats. He is to do these daily. When he returns to running I would like for him to start with level ground on a soft surface such as grass. We've given him a body helix compression sleeve to wear with running as well. If symptoms persist or worsen note consider further imaging initially in the form of an x-ray. He may take over-the-counter NSAIDs as needed and will follow-up with me for ongoing or recalcitrant issues.

## 2017-09-19 MED FILL — TRULICITY 0.75 MG/0.5 ML PE: 0.75 | 28 days supply | Qty: 2 | Fill #2

## 2017-09-29 DIAGNOSIS — H35372 Puckering of macula, left eye: Secondary | ICD-10-CM | POA: Diagnosis not present

## 2017-10-17 MED FILL — TRULICITY 0.75 MG/0.5 ML PE: 0.75 | 28 days supply | Qty: 2 | Fill #3

## 2017-10-18 DIAGNOSIS — E119 Type 2 diabetes mellitus without complications: Secondary | ICD-10-CM | POA: Diagnosis not present

## 2017-10-18 DIAGNOSIS — Z8546 Personal history of malignant neoplasm of prostate: Secondary | ICD-10-CM | POA: Diagnosis not present

## 2017-10-18 DIAGNOSIS — D696 Thrombocytopenia, unspecified: Secondary | ICD-10-CM | POA: Diagnosis not present

## 2017-10-18 DIAGNOSIS — E7800 Pure hypercholesterolemia, unspecified: Secondary | ICD-10-CM | POA: Diagnosis not present

## 2017-10-18 DIAGNOSIS — Z0000 Encounter for general adult medical examination without abnormal findings: Secondary | ICD-10-CM | POA: Diagnosis not present

## 2017-11-07 ENCOUNTER — Other Ambulatory Visit: Payer: Self-pay | Admitting: *Deleted

## 2017-11-07 ENCOUNTER — Telehealth: Payer: Self-pay | Admitting: Sports Medicine

## 2017-11-07 DIAGNOSIS — M25562 Pain in left knee: Secondary | ICD-10-CM

## 2017-11-07 NOTE — Telephone Encounter (Signed)
  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 Unfortunately, he continues to struggle with knee pain. I would like to get an MRI to evaluate the degree of degenerative change in the knee as well as to rule out a meniscal tear. Phone follow-up with those results when available. We will delineate further treatment based on those findings.

## 2017-11-08 MED FILL — ATORVASTATIN 10 MG TABLET: 10 | 90 days supply | Qty: 90 | Fill #0

## 2017-11-14 MED FILL — metFORMIN HCL 500 MG TABS: 500 | 90 days supply | Qty: 180 | Fill #1

## 2017-11-14 MED FILL — TRULICITY 0.75 MG/0.5 ML PE: 0.75 | 28 days supply | Qty: 2 | Fill #4

## 2017-11-16 ENCOUNTER — Ambulatory Visit (HOSPITAL_COMMUNITY)
Admission: RE | Admit: 2017-11-16 | Discharge: 2017-11-16 | Disposition: A | Discharge status: Home / Self Care | Payer: 59 | Source: Ambulatory Visit | Attending: Sports Medicine | Admitting: Sports Medicine

## 2017-11-16 DIAGNOSIS — M1712 Unilateral primary osteoarthritis, left knee: Secondary | ICD-10-CM | POA: Insufficient documentation

## 2017-11-16 DIAGNOSIS — S83242A Other tear of medial meniscus, current injury, left knee, initial encounter: Secondary | ICD-10-CM | POA: Diagnosis not present

## 2017-11-16 DIAGNOSIS — M25562 Pain in left knee: Secondary | ICD-10-CM

## 2017-11-16 DIAGNOSIS — X58XXXA Exposure to other specified factors, initial encounter: Secondary | ICD-10-CM | POA: Diagnosis not present

## 2017-11-21 ENCOUNTER — Telehealth: Payer: Self-pay | Admitting: Sports Medicine

## 2017-11-21 NOTE — Telephone Encounter (Signed)
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 He continues to endorse pain and swelling especially with prolonged walking or standing but denies mechanical symptoms. I discussed treatment options with him including cortisone injection, continuing with quad and hamstring strengthening, and surgical referral. Given his lack of mechanical symptoms and presence of degenerative changes, I'm not sure that arthroscopy would benefit him. I recommended a cortisone injection as his next step in treatment. He may also have to modify his activity adding in more biking than running. He will also continue to strengthen his knee.He will schedule an appointment with me at some point in the near future to discuss this further and discuss cortisone injection.

## 2017-12-12 MED FILL — TRULICITY 0.75 MG/0.5 ML PE: 0.75 | 28 days supply | Qty: 2 | Fill #5

## 2017-12-21 DIAGNOSIS — H35371 Puckering of macula, right eye: Secondary | ICD-10-CM | POA: Diagnosis not present

## 2017-12-21 DIAGNOSIS — H5213 Myopia, bilateral: Secondary | ICD-10-CM | POA: Diagnosis not present

## 2017-12-25 DIAGNOSIS — E119 Type 2 diabetes mellitus without complications: Secondary | ICD-10-CM | POA: Diagnosis not present

## 2017-12-25 DIAGNOSIS — H33322 Round hole, left eye: Secondary | ICD-10-CM | POA: Diagnosis not present

## 2017-12-25 DIAGNOSIS — H35371 Puckering of macula, right eye: Secondary | ICD-10-CM | POA: Diagnosis not present

## 2017-12-25 DIAGNOSIS — H35412 Lattice degeneration of retina, left eye: Secondary | ICD-10-CM | POA: Diagnosis not present

## 2018-01-05 MED FILL — MOXIFLOXACIN HCL 0.5 % SOLN: 0.5 | 15 days supply | Qty: 3 | Fill #0

## 2018-01-05 MED FILL — DUREZOL 0.05% EYE DROPS: 0.05 | 25 days supply | Qty: 5 | Fill #0

## 2018-01-06 ENCOUNTER — Ambulatory Visit: Payer: Self-pay | Admitting: Family

## 2018-01-06 ENCOUNTER — Encounter: Payer: Self-pay | Admitting: Family

## 2018-01-06 VITALS — BP 115/70 | HR 61 | Temp 98.1°F | Resp 16 | Wt 183.6 lb

## 2018-01-06 DIAGNOSIS — L710 Perioral dermatitis: Secondary | ICD-10-CM

## 2018-01-06 MED ORDER — DOXYCYCLINE HYCLATE 100 MG PO TABS: 100.0000 mg | ORAL_TABLET | Freq: Two times a day (BID) | ORAL | 0 refills | Status: DC

## 2018-01-06 NOTE — Patient Instructions (Signed)
Rash A rash is a change in the color of the skin. A rash can also change the way your skin feels. There are many different conditions and factors that can cause a rash. Follow these instructions at home: Pay attention to any changes in your symptoms. Follow these instructions to help with your condition: Medicine Take or apply over-the-counter and prescription medicines only as told by your health care provider. These may include:  Corticosteroid cream.  Anti-itch lotions.  Oral antihistamines.  Skin Care  Apply cool compresses to the affected areas.  Try taking a bath with: ? Epsom salts. Follow the instructions on the packaging. You can get these at your local pharmacy or grocery store. ? Baking soda. Pour a small amount into the bath as told by your health care provider. ? Colloidal oatmeal. Follow the instructions on the packaging. You can get this at your local pharmacy or grocery store.  Try applying baking soda paste to your skin. Stir water into baking soda until it reaches a paste-like consistency.  Do not scratch or rub your skin.  Avoid covering the rash. Make sure the rash is exposed to air as much as possible. General instructions  Avoid hot showers or baths, which can make itching worse. A cold shower may help.  Avoid scented soaps, detergents, and perfumes. Use gentle soaps, detergents, perfumes, and other cosmetic products.  Avoid any substance that causes your rash. Keep a journal to help track what causes your rash. Write down: ? What you eat. ? What cosmetic products you use. ? What you drink. ? What you wear. This includes jewelry.  Keep all follow-up visits as told by your health care provider. This is important. Contact a health care provider if:  You sweat at night.  You lose weight.  You urinate more than normal.  You feel weak.  You vomit.  Your skin or the whites of your eyes look yellow (jaundice).  Your skin: ? Tingles. ? Is  numb.  Your rash: ? Does not go away after several days. ? Gets worse.  You are: ? Unusually thirsty. ? More tired than normal.  You have: ? New symptoms. ? Pain in your abdomen. ? A fever. ? Diarrhea. Get help right away if:  You develop a rash that covers all or most of your body. The rash may or may not be painful.  You develop blisters that: ? Are on top of the rash. ? Grow larger or grow together. ? Are painful. ? Are inside your nose or mouth.  You develop a rash that: ? Looks like purple pinprick-sized spots all over your body. ? Has a "bull's eye" or looks like a target. ? Is not related to sun exposure, is red and painful, and causes your skin to peel. This information is not intended to replace advice given to you by your health care provider. Make sure you discuss any questions you have with your health care provider. Document Released: 06/03/2002 Document Revised: 11/17/2015 Document Reviewed: 10/29/2014 [employer] Interactive Patient Education  2018 [employer]  

## 2018-01-06 NOTE — Progress Notes (Signed)
Subjective:     Patient ID: Timothy [name], male   DOB: Oct 30, 1955, 62 y.o.   MRN: [id number]  HPI 62 year old male is in today with c/o a rash to the left side of his nose x 6 weeks. He has applied a topical steroid and anti-fungal that did not seem to help much. Rash is itchy and red. Has stopped his Flonase rx  Review of Systems  Constitutional: Negative.   Respiratory: Negative.   Cardiovascular: Negative.   Endocrine: Negative.   Skin: Positive for rash.  Allergic/Immunologic: Positive for environmental allergies.  Neurological: Negative.   Psychiatric/Behavioral: Negative.    Past Medical History:  Diagnosis Date  . Allergy   . Blood dyscrasia    platlet count tends to run low  . Diabetes mellitus without complication ([place])   . GERD (gastroesophageal reflux disease)    otc pepcid 10 mg otc  . Hx of adenomatous colonic polyps 01/18/2017  . Hyperlipidemia   . Pneumonia    hx of 3/16   . Prostate cancer ([place]) 11/11/2014   hx of skin cancers   . Retinal detachment 2015   with surgery   . Sleep apnea    no longer has to use CPAP wt. loss  . Wears glasses     Social History   Socioeconomic History  . Marital status: Married    Spouse name: Not on file  . Number of children: Not on file  . Years of education: Not on file  . Highest education level: Not on file  Occupational History  . Occupation: physician  Social Needs  . Financial resource strain: Not on file  . Food insecurity:    Worry: Not on file    Inability: Not on file  . Transportation needs:    Medical: Not on file    Non-medical: Not on file  Tobacco Use  . Smoking status: Never Smoker  . Smokeless tobacco: Never Used  Substance and Sexual Activity  . Alcohol use: Yes    Alcohol/week: 1.2 oz    Types: 2 Glasses of wine per week    Comment: occasional   . Drug use: No  . Sexual activity: Not on file  Lifestyle  . Physical activity:    Days per week: Not on file    Minutes per session: Not  on file  . Stress: Not on file  Relationships  . Social connections:    Talks on phone: Not on file    Gets together: Not on file    Attends religious service: Not on file    Active member of club or organization: Not on file    Attends meetings of clubs or organizations: Not on file    Relationship status: Not on file  . Intimate partner violence:    Fear of current or ex partner: Not on file    Emotionally abused: Not on file    Physically abused: Not on file    Forced sexual activity: Not on file  Other Topics Concern  . Not on file  Social History Narrative  . Not on file    Past Surgical History:  Procedure Laterality Date  . COLONOSCOPY    . EYE SURGERY     detached retina- 07/2013   . GAS INSERTION Right 08/19/2013   Procedure: INSERTION OF GAS;  Surgeon: Hurman Horn, MD;  Location: [facility];  Service: Ophthalmology;  Laterality: Right;  SF6  . MASS EXCISION Left 07/08/2014   Procedure:  EXCISION OF KERATOACANTHOMA LEFT LOWER LEG, AND EXCISION OF BASIL CELL CARCINOMA FROM NECK;  Surgeon: Armandina Gemma, MD;  Location: [facility];  Service: General;  Laterality: Left;  left lower leg and posterior neck  . nasal setoplasty    . PROSTATE BIOPSY  11/11/14  . ROBOT ASSISTED LAPAROSCOPIC RADICAL PROSTATECTOMY N/A 05/11/2015   Procedure: ROBOTIC ASSISTED LAPAROSCOPIC RADICAL PROSTATECTOMY LEVEL 1;  Surgeon: Raynelle Bring, MD;  Location: [facility];  Service: Urology;  Laterality: N/A;  . SCLERAL BUCKLE WITH CRYO Right 08/19/2013   Procedure: SCLERAL BUCKLE WITH CRYOPEXY;  Surgeon: Hurman Horn, MD;  Location: [facility];  Service: Ophthalmology;  Laterality: Right;  . TONSILLECTOMY AND ADENOIDECTOMY      Family History  Problem Relation Age of Onset  . Dementia Father   . Bowel Disease Father        ischemic bowl disease  . Kidney Stones Mother   . Colon cancer Neg Hx   . Colon polyps Neg Hx   . Esophageal cancer Neg Hx   . Rectal cancer Neg Hx   . Stomach cancer Neg Hx      Allergies  Allergen Reactions  . Sporanox [Itraconazole] Hives  . Ace Inhibitors     REACTION: COUGH    Current Outpatient Medications on File Prior to Visit  Medication Sig Dispense Refill  . atorvastatin (LIPITOR) 10 MG tablet Take 10 mg by mouth daily.  3  . cetirizine (ZYRTEC) 10 MG tablet Take 10 mg by mouth daily.    . famotidine (PEPCID) 20 MG tablet Take 10 mg by mouth daily as needed for heartburn or indigestion.     [name] Kitchen glucose blood (FREESTYLE TEST STRIPS) test strip 1 each daily as needed.      . Lancets (FREESTYLE) lancets 1 each daily as needed.      . metFORMIN (GLUCOPHAGE) 500 MG tablet Take 500 mg by mouth 2 (two) times daily with a meal.     . Multiple Vitamin (MULTIVITAMIN) tablet Take 1 tablet by mouth daily.    . TRULICITY 0.27 OZ/3.6UY SOPN INJECT 0 75 MILLIGRAMS ONCE A WEEK SUBCUTANEOUSLY  11  . fluticasone (FLONASE) 50 MCG/ACT nasal spray Place 2 sprays into both nostrils daily.     Current Facility-Administered Medications on File Prior to Visit  Medication Dose Route Frequency Provider Last Rate Last Dose  . 0.9 %  sodium chloride infusion  500 mL Intravenous Continuous Gatha Mayer, MD        BP 115/70 (BP Location: Right Arm, Patient Position: Sitting, Cuff Size: Normal)   Pulse 61   Temp 98.1 F (36.7 C) (Oral)   Resp 16   Wt 183 lb 9.6 oz (83.3 kg)   SpO2 97%   BMI 24.90 kg/m chart    Objective:   Physical Exam  Constitutional: He appears well-developed and well-nourished.  HENT:  Mouth/Throat: Oropharynx is clear and moist.  Neck: Normal range of motion. Neck supple.  Cardiovascular: Normal rate, regular rhythm and normal heart sounds.  Pulmonary/Chest: Effort normal and breath sounds normal.  Skin: Rash noted. There is erythema.  Red, mildly pustular rash to the left side of the nose.   Psychiatric: He has a normal mood and affect.       Assessment:     Timothy [name] was seen today for rash.  Diagnoses and all orders for this  visit:  Perioral dermatitis  Other orders -     doxycycline (VIBRA-TABS) 100 MG tablet; Take 1  tablet (100 mg total) by mouth 2 (two) times daily.      Plan:     Call the office with any questions or concerns

## 2018-01-09 MED FILL — TRULICITY 0.75 MG/0.5 ML PE: 0.75 | 28 days supply | Qty: 2 | Fill #6

## 2018-01-11 DIAGNOSIS — Z8546 Personal history of malignant neoplasm of prostate: Secondary | ICD-10-CM | POA: Diagnosis not present

## 2018-01-16 DIAGNOSIS — H2511 Age-related nuclear cataract, right eye: Secondary | ICD-10-CM | POA: Diagnosis not present

## 2018-01-16 DIAGNOSIS — H25811 Combined forms of age-related cataract, right eye: Secondary | ICD-10-CM | POA: Diagnosis not present

## 2018-01-16 DIAGNOSIS — H25011 Cortical age-related cataract, right eye: Secondary | ICD-10-CM | POA: Diagnosis not present

## 2018-01-24 MED FILL — MOXIFLOXACIN HCL 0.5 % SOLN: 0.5 | 15 days supply | Qty: 3 | Fill #0

## 2018-01-25 HISTORY — PX: CATARACT EXTRACTION W/ INTRAOCULAR LENS  IMPLANT, BILATERAL: SHX1307

## 2018-01-25 MED FILL — DUREZOL 0.05% EYE DROPS: 0.05 | 25 days supply | Qty: 5 | Fill #0

## 2018-01-30 DIAGNOSIS — H25042 Posterior subcapsular polar age-related cataract, left eye: Secondary | ICD-10-CM | POA: Diagnosis not present

## 2018-01-30 DIAGNOSIS — H25812 Combined forms of age-related cataract, left eye: Secondary | ICD-10-CM | POA: Diagnosis not present

## 2018-01-30 DIAGNOSIS — H2512 Age-related nuclear cataract, left eye: Secondary | ICD-10-CM | POA: Diagnosis not present

## 2018-02-05 MED FILL — metFORMIN HCL 500 MG TABS: 500 | 90 days supply | Qty: 180 | Fill #2

## 2018-02-05 MED FILL — TRULICITY 0.75 MG/0.5 ML PE: 0.75 | 28 days supply | Qty: 2 | Fill #7

## 2018-02-05 MED FILL — ACCU-CHEK FASTCLIX LANCETS: 90 days supply | Qty: 204 | Fill #0

## 2018-02-05 MED FILL — ATORVASTATIN 10 MG TABLET: 10 | 90 days supply | Qty: 90 | Fill #1

## 2018-02-05 MED FILL — ACCU-CHEK GUIDE STRP: 90 days supply | Qty: 200 | Fill #0

## 2018-03-06 MED FILL — TRULICITY 0.75 MG/0.5 ML PE: 0.75 | 28 days supply | Qty: 2 | Fill #8

## 2018-04-03 MED FILL — TRULICITY 0.75 MG/0.5 ML PE: 0.75 | 28 days supply | Qty: 2 | Fill #9

## 2018-04-23 DIAGNOSIS — L989 Disorder of the skin and subcutaneous tissue, unspecified: Secondary | ICD-10-CM | POA: Diagnosis not present

## 2018-04-23 DIAGNOSIS — E119 Type 2 diabetes mellitus without complications: Secondary | ICD-10-CM | POA: Diagnosis not present

## 2018-04-23 DIAGNOSIS — R21 Rash and other nonspecific skin eruption: Secondary | ICD-10-CM | POA: Diagnosis not present

## 2018-04-23 MED FILL — metroNIDAZOLE 0.75 % CREA: 0.75 | 14 days supply | Qty: 45 | Fill #0

## 2018-04-25 MED FILL — TRULICITY 0.75 MG/0.5 ML PE: 0.75 | 28 days supply | Qty: 2 | Fill #10

## 2018-04-25 MED FILL — metFORMIN HCL 500 MG TABS: 500 | 90 days supply | Qty: 180 | Fill #3

## 2018-04-25 MED FILL — ATORVASTATIN 10 MG TABLET: 10 | 90 days supply | Qty: 90 | Fill #2

## 2018-04-26 DIAGNOSIS — C4359 Malignant melanoma of other part of trunk: Secondary | ICD-10-CM | POA: Diagnosis not present

## 2018-04-26 DIAGNOSIS — L812 Freckles: Secondary | ICD-10-CM | POA: Diagnosis not present

## 2018-04-26 DIAGNOSIS — L821 Other seborrheic keratosis: Secondary | ICD-10-CM | POA: Diagnosis not present

## 2018-04-26 DIAGNOSIS — D2371 Other benign neoplasm of skin of right lower limb, including hip: Secondary | ICD-10-CM | POA: Diagnosis not present

## 2018-04-26 DIAGNOSIS — D225 Melanocytic nevi of trunk: Secondary | ICD-10-CM | POA: Diagnosis not present

## 2018-04-26 DIAGNOSIS — L710 Perioral dermatitis: Secondary | ICD-10-CM | POA: Diagnosis not present

## 2018-04-26 DIAGNOSIS — C44319 Basal cell carcinoma of skin of other parts of face: Secondary | ICD-10-CM | POA: Diagnosis not present

## 2018-04-26 DIAGNOSIS — D2339 Other benign neoplasm of skin of other parts of face: Secondary | ICD-10-CM | POA: Diagnosis not present

## 2018-04-26 DIAGNOSIS — D485 Neoplasm of uncertain behavior of skin: Secondary | ICD-10-CM | POA: Diagnosis not present

## 2018-04-26 DIAGNOSIS — L570 Actinic keratosis: Secondary | ICD-10-CM | POA: Diagnosis not present

## 2018-05-09 DIAGNOSIS — Z85820 Personal history of malignant melanoma of skin: Secondary | ICD-10-CM | POA: Diagnosis not present

## 2018-05-09 DIAGNOSIS — C4359 Malignant melanoma of other part of trunk: Secondary | ICD-10-CM | POA: Diagnosis not present

## 2018-05-09 DIAGNOSIS — D0359 Melanoma in situ of other part of trunk: Secondary | ICD-10-CM | POA: Diagnosis not present

## 2018-05-09 MED FILL — MUPIROCIN 2% OINTMENT: 2 | 5 days supply | Qty: 22 | Fill #0

## 2018-05-29 MED FILL — TRULICITY 0.75 MG/0.5 ML PE: 0.75 | 28 days supply | Qty: 2 | Fill #11

## 2018-06-14 DIAGNOSIS — Z85820 Personal history of malignant melanoma of skin: Secondary | ICD-10-CM | POA: Diagnosis not present

## 2018-06-14 DIAGNOSIS — C44319 Basal cell carcinoma of skin of other parts of face: Secondary | ICD-10-CM | POA: Diagnosis not present

## 2018-06-14 DIAGNOSIS — Z85828 Personal history of other malignant neoplasm of skin: Secondary | ICD-10-CM | POA: Diagnosis not present

## 2018-06-16 ENCOUNTER — Encounter: Payer: Self-pay | Admitting: Nurse Practitioner

## 2018-06-16 ENCOUNTER — Ambulatory Visit: Payer: Self-pay | Admitting: Nurse Practitioner

## 2018-06-16 VITALS — BP 110/70 | HR 89 | Temp 97.8°F | Wt 190.6 lb

## 2018-06-16 DIAGNOSIS — J209 Acute bronchitis, unspecified: Secondary | ICD-10-CM

## 2018-06-16 MED ORDER — BENZONATATE 200 MG PO CAPS
200.0000 mg | ORAL_CAPSULE | Freq: Three times a day (TID) | ORAL | 0 refills | Status: AC | PRN
Start: 2018-06-16 — End: 2018-06-26

## 2018-06-16 MED ORDER — DOXYCYCLINE HYCLATE 100 MG PO TABS: 100.0000 mg | ORAL_TABLET | Freq: Two times a day (BID) | ORAL | 0 refills | Status: AC

## 2018-06-16 NOTE — Progress Notes (Signed)
Subjective:     Timothy [name] is a 62 y.o. male here for evaluation of a cough.  The cough is productive of green/yellow sputum, worsening over time and is aggravated by nothing. Onset of symptoms was 5 days ago, gradually worsening since that time.  Associated symptoms include change in voice, postnasal drip and fatigue, nasal congestion, bodyaches and  increased fasting blood sugars.  Patient states the cough is worse in the morning and most productive in the morning.  Patient does not have a history of asthma. Patient has not had recent travel. Patient does not have a history of smoking. Patient  has not had a previous chest x-ray.  The patient informs that he has a history of pneumonia which will usually start out with the same symptomology.  Patient is also concerned about his elevated blood sugars and any additional disease process that may affecting this.  Patient states that he does have seasonal allergies for which she takes Flonase and Zyrtec for daily.  Patient informs he has had his annual flu vaccine. Patient has a history of Type II DM, sleep apnea, asthma and cancer.   The following portions of the patient's history were reviewed and updated as appropriate: allergies, current medications and past medical history.  Review of Systems Constitutional: positive for fatigue, negative for anorexia, chills, fevers and malaise Eyes: negative Ears, nose, mouth, throat, and face: positive for nasal congestion, voice change and postnasal drip, negative for ear drainage and earaches Respiratory: positive for asthma, chronic bronchitis, cough, pneumonia and sputum, negative for emphysema, hemoptysis and pleurisy/chest pain Cardiovascular: negative Gastrointestinal: negative Neurological: positive for headaches, negative for coordination problems, dizziness, paresthesia, vertigo and weakness Allergic/Immunologic: positive for hay fever     Objective:    BP 110/70 (BP Location: Right Arm,  Patient Position: Sitting)   Pulse 89   Temp 97.8 F (36.6 C) (Oral)   Wt 190 lb 9.6 oz (86.5 kg)   SpO2 98%   BMI 25.85 kg/m  General appearance: alert, cooperative, fatigued and no distress Head: Normocephalic, without obvious abnormality, atraumatic Eyes: conjunctivae/corneas clear. PERRL, EOM's intact. Fundi benign. Ears: normal TM's and external ear canals both ears Nose: Nares normal. Septum midline. Mucosa normal. No drainage or sinus tenderness. Throat: lips, mucosa, and tongue normal; teeth and gums normal Lungs: rhonchi anterior - LUL Heart: regular rate and rhythm, S1, S2 normal, no murmur, click, rub or gallop Abdomen: soft, non-tender; bowel sounds normal; no masses,  no organomegaly Pulses: 2+ and symmetric Skin: Skin color, texture, turgor normal. No rashes or lesions Lymph nodes: cervical and submandibular nodes normal Neurologic: Grossly normal    Assessment:    Acute Bronchitis    Plan:   Exam findings, diagnosis etiology and medication use and indications reviewed with patient. Follow- Up and discharge instructions provided. No emergent/urgent issues found on exam.  Based on this patient's clinical presentation, physical exam, and comorbid illnesses, we will go ahead and treat empirically for an acute bronchitis and for coverage for his sinus symptoms although most bronchitis symptoms are of viral etiology. The patient is concerned that if symptoms are not treated early he will develop pneumonia, which has happened in the past and this is something we would definitely like to prevent.  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  Discussed with patient to follow-up with his PCP for a possible chest x-ray if needed if his symptoms  do not improve.  Patient verbalized understanding at that time.  Patient education was provided. Patient verbalized understanding of information provided and  agrees with plan of care (POC), all questions answered. The patient is advised to call or return to clinic if condition does not see an improvement in symptoms, or to seek the care of the closest emergency department if condition worsens with the above plan.   1. Acute bronchitis, unspecified organism  - doxycycline (VIBRA-TABS) 100 MG tablet; Take 1 tablet (100 mg total) by mouth 2 (two) times daily for 7 days.  Dispense: 14 tablet; Refill: 0 - benzonatate (TESSALON) 200 MG capsule; Take 1 capsule (200 mg total) by mouth 3 (three) times daily as needed for up to 10 days for cough.  Dispense: 30 capsule; Refill: 0 -Take medication as prescribed. -Ibuprofen or Tylenol for pain, fever, or general discomfort. -Increase fluids. -Sleep elevated on at least 2 pillows at bedtime to help with cough. -Use a humidifier or vaporizer when at home and during sleep to help with cough. -May use a teaspoon of honey or over-the-counter cough drops to help with cough. -Follow up with PCP if symptoms do not improve.  Will need a chest x-ray at that time. -Follow-up if symptoms do not improve.

## 2018-06-16 NOTE — Patient Instructions (Signed)
Acute Bronchitis, Adult  -Take medication as prescribed. -Ibuprofen or Tylenol for pain, fever, or general discomfort. -Increase fluids. -Sleep elevated on at least 2 pillows at bedtime to help with cough. -Use a humidifier or vaporizer when at home and during sleep to help with cough. -May use a teaspoon of honey or over-the-counter cough drops to help with cough. -Follow up with PCP if symptoms do not improve.  Will need a chest x-ray at that time. -Follow-up if symptoms do not improve.  Acute bronchitis is sudden (acute) swelling of the air tubes (bronchi) in the lungs. Acute bronchitis causes these tubes to fill with mucus, which can make it hard to breathe. It can also cause coughing or wheezing. In adults, acute bronchitis usually goes away within 2 weeks. A cough caused by bronchitis may last up to 3 weeks. Smoking, allergies, and asthma can make the condition worse. Repeated episodes of bronchitis may cause further lung problems, such as chronic obstructive pulmonary disease (COPD). What are the causes? This condition can be caused by germs and by substances that irritate the lungs, including:  Cold and flu viruses. This condition is most often caused by the same virus that causes a cold.  Bacteria.  Exposure to tobacco smoke, dust, fumes, and air pollution. What increases the risk? This condition is more likely to develop in people who:  Have close contact with someone with acute bronchitis.  Are exposed to lung irritants, such as tobacco smoke, dust, fumes, and vapors.  Have a weak immune system.  Have a respiratory condition such as asthma. What are the signs or symptoms? Symptoms of this condition include:  A cough.  Coughing up clear, yellow, or green mucus.  Wheezing.  Chest congestion.  Shortness of breath.  A fever.  Body aches.  Chills.  A sore throat. How is this diagnosed? This condition is usually diagnosed with a physical exam. During the exam,  your health care provider may order tests, such as chest X-rays, to rule out other conditions. He or she may also:  Test a sample of your mucus for bacterial infection.  Check the level of oxygen in your blood. This is done to check for pneumonia.  Do a chest X-ray or lung function testing to rule out pneumonia and other conditions.  Perform blood tests. Your health care provider will also ask about your symptoms and medical history. How is this treated? Most cases of acute bronchitis clear up over time without treatment. Your health care provider may recommend:  Drinking more fluids. Drinking more makes your mucus thinner, which may make it easier to breathe.  Taking a medicine for a fever or cough.  Taking an antibiotic medicine.  Using an inhaler to help improve shortness of breath and to control a cough.  Using a cool mist vaporizer or humidifier to make it easier to breathe. Follow these instructions at home: Medicines  Take over-the-counter and prescription medicines only as told by your health care provider.  If you were prescribed an antibiotic, take it as told by your health care provider. Do not stop taking the antibiotic even if you start to feel better. General instructions   Get plenty of rest.  Drink enough fluids to keep your urine pale yellow.  Avoid smoking and secondhand smoke. Exposure to cigarette smoke or irritating chemicals will make bronchitis worse. If you smoke and you need help quitting, ask your health care provider. Quitting smoking will help your lungs heal faster.  Use an inhaler,  cool mist vaporizer, or humidifier as told by your health care provider.  Keep all follow-up visits as told by your health care provider. This is important. How is this prevented? To lower your risk of getting this condition again:  Wash your hands often with soap and water. If soap and water are not available, use hand sanitizer.  Avoid contact with people who  have cold symptoms.  Try not to touch your hands to your mouth, nose, or eyes.  Make sure to get the flu shot every year. Contact a health care provider if:  Your symptoms do not improve in 2 weeks of treatment. Get help right away if:  You cough up blood.  You have chest pain.  You have severe shortness of breath.  You become dehydrated.  You faint or keep feeling like you are going to faint.  You keep vomiting.  You have a severe headache.  Your fever or chills gets worse. This information is not intended to replace advice given to you by your health care provider. Make sure you discuss any questions you have with your health care provider. Document Released: 07/21/2004 Document Revised: 01/25/2017 Document Reviewed: 12/02/2015 Elsevier Interactive Patient Education  2019 [employer].

## 2018-06-25 MED FILL — TRULICITY 0.75 MG/0.5 ML PE: 0.75 | 28 days supply | Qty: 2 | Fill #0

## 2018-07-06 DIAGNOSIS — H33302 Unspecified retinal break, left eye: Secondary | ICD-10-CM | POA: Diagnosis not present

## 2018-07-06 DIAGNOSIS — H31092 Other chorioretinal scars, left eye: Secondary | ICD-10-CM | POA: Diagnosis not present

## 2018-07-06 DIAGNOSIS — H43813 Vitreous degeneration, bilateral: Secondary | ICD-10-CM | POA: Diagnosis not present

## 2018-07-06 DIAGNOSIS — H33312 Horseshoe tear of retina without detachment, left eye: Secondary | ICD-10-CM | POA: Diagnosis not present

## 2018-07-06 DIAGNOSIS — H43812 Vitreous degeneration, left eye: Secondary | ICD-10-CM | POA: Diagnosis not present

## 2018-07-13 DIAGNOSIS — Z8546 Personal history of malignant neoplasm of prostate: Secondary | ICD-10-CM | POA: Diagnosis not present

## 2018-07-16 MED FILL — ACCU-CHEK FASTCLIX LANCETS: 90 days supply | Qty: 204 | Fill #1

## 2018-07-16 MED FILL — ACCU-CHEK GUIDE STRP: 90 days supply | Qty: 200 | Fill #1

## 2018-07-20 DIAGNOSIS — H33312 Horseshoe tear of retina without detachment, left eye: Secondary | ICD-10-CM | POA: Diagnosis not present

## 2018-07-23 MED FILL — metFORMIN HCL 500 MG TABS: 500 | 90 days supply | Qty: 180 | Fill #0

## 2018-07-23 MED FILL — ATORVASTATIN 10 MG TABLET: 10 | 90 days supply | Qty: 90 | Fill #0

## 2018-07-24 MED FILL — TRULICITY 0.75 MG/0.5 ML PE: 0.75 | 28 days supply | Qty: 2 | Fill #1

## 2018-08-07 DIAGNOSIS — Z85820 Personal history of malignant melanoma of skin: Secondary | ICD-10-CM | POA: Diagnosis not present

## 2018-08-07 DIAGNOSIS — D225 Melanocytic nevi of trunk: Secondary | ICD-10-CM | POA: Diagnosis not present

## 2018-08-07 DIAGNOSIS — Z85828 Personal history of other malignant neoplasm of skin: Secondary | ICD-10-CM | POA: Diagnosis not present

## 2018-08-07 DIAGNOSIS — D2271 Melanocytic nevi of right lower limb, including hip: Secondary | ICD-10-CM | POA: Diagnosis not present

## 2018-08-07 DIAGNOSIS — L821 Other seborrheic keratosis: Secondary | ICD-10-CM | POA: Diagnosis not present

## 2018-08-07 DIAGNOSIS — D2272 Melanocytic nevi of left lower limb, including hip: Secondary | ICD-10-CM | POA: Diagnosis not present

## 2018-08-15 DIAGNOSIS — N5201 Erectile dysfunction due to arterial insufficiency: Secondary | ICD-10-CM | POA: Diagnosis not present

## 2018-08-15 DIAGNOSIS — Z8546 Personal history of malignant neoplasm of prostate: Secondary | ICD-10-CM | POA: Diagnosis not present

## 2018-08-17 MED FILL — TRULICITY 0.75 MG/0.5 ML PE: 0.75 | 28 days supply | Qty: 2 | Fill #2

## 2018-09-18 MED FILL — TRULICITY 0.75 MG/0.5 ML PE: 0.75 | 28 days supply | Qty: 2 | Fill #3 | Status: TO

## 2018-10-10 MED FILL — TRULICITY 0.75 MG/0.5 ML PE: 0.75 | 28 days supply | Qty: 2 | Fill #0

## 2018-10-10 MED FILL — metFORMIN HCL 500 MG TABS: 500 | 90 days supply | Qty: 180 | Fill #0

## 2018-10-10 MED FILL — ATORVASTATIN 10 MG TABLET: 10 | 90 days supply | Qty: 90 | Fill #0

## 2018-10-22 DIAGNOSIS — E119 Type 2 diabetes mellitus without complications: Secondary | ICD-10-CM | POA: Diagnosis not present

## 2018-10-22 DIAGNOSIS — Z7984 Long term (current) use of oral hypoglycemic drugs: Secondary | ICD-10-CM | POA: Diagnosis not present

## 2018-11-05 MED FILL — TRULICITY 0.75 MG/0.5 ML PE: 0.75 | 28 days supply | Qty: 2 | Fill #1 | Status: TO

## 2018-11-27 ENCOUNTER — Other Ambulatory Visit: Payer: Self-pay

## 2018-11-27 ENCOUNTER — Other Ambulatory Visit: Payer: Self-pay | Admitting: *Deleted

## 2018-11-27 DIAGNOSIS — Z20822 Contact with and (suspected) exposure to covid-19: Secondary | ICD-10-CM

## 2018-11-27 NOTE — Addendum Note (Signed)
Addended by: Brigitte Pulse on: 11/27/2018 05:29 PM   Modules accepted: Orders

## 2018-12-03 MED FILL — TRULICITY 0.75 MG/0.5 ML PE: 0.75 | 28 days supply | Qty: 2 | Fill #0

## 2019-01-01 MED FILL — TRULICITY 0.75 MG/0.5 ML PE: 0.75 | 28 days supply | Qty: 2 | Fill #1

## 2019-01-04 DIAGNOSIS — Z85820 Personal history of malignant melanoma of skin: Secondary | ICD-10-CM | POA: Diagnosis not present

## 2019-01-04 DIAGNOSIS — Z862 Personal history of diseases of the blood and blood-forming organs and certain disorders involving the immune mechanism: Secondary | ICD-10-CM | POA: Diagnosis not present

## 2019-01-04 DIAGNOSIS — Z0000 Encounter for general adult medical examination without abnormal findings: Secondary | ICD-10-CM | POA: Diagnosis not present

## 2019-01-04 DIAGNOSIS — E119 Type 2 diabetes mellitus without complications: Secondary | ICD-10-CM | POA: Diagnosis not present

## 2019-01-04 DIAGNOSIS — E7800 Pure hypercholesterolemia, unspecified: Secondary | ICD-10-CM | POA: Diagnosis not present

## 2019-01-04 DIAGNOSIS — Z8546 Personal history of malignant neoplasm of prostate: Secondary | ICD-10-CM | POA: Diagnosis not present

## 2019-01-04 DIAGNOSIS — Z23 Encounter for immunization: Secondary | ICD-10-CM | POA: Diagnosis not present

## 2019-01-18 DIAGNOSIS — H43392 Other vitreous opacities, left eye: Secondary | ICD-10-CM | POA: Diagnosis not present

## 2019-01-18 DIAGNOSIS — H31092 Other chorioretinal scars, left eye: Secondary | ICD-10-CM | POA: Diagnosis not present

## 2019-01-18 DIAGNOSIS — H43813 Vitreous degeneration, bilateral: Secondary | ICD-10-CM | POA: Diagnosis not present

## 2019-01-18 DIAGNOSIS — H35372 Puckering of macula, left eye: Secondary | ICD-10-CM | POA: Diagnosis not present

## 2019-01-18 MED FILL — metFORMIN HCL 500 MG TABS: 500 | 90 days supply | Qty: 180 | Fill #0

## 2019-01-18 MED FILL — ATORVASTATIN 10 MG TABLET: 10 | 90 days supply | Qty: 90 | Fill #0

## 2019-01-21 MED FILL — ACCU-CHEK GUIDE STRP: 90 days supply | Qty: 200 | Fill #2

## 2019-01-29 MED FILL — ACCU-CHEK FASTCLIX LANCETS: 90 days supply | Qty: 204 | Fill #2

## 2019-01-30 DIAGNOSIS — R634 Abnormal weight loss: Secondary | ICD-10-CM | POA: Diagnosis not present

## 2019-01-30 DIAGNOSIS — R944 Abnormal results of kidney function studies: Secondary | ICD-10-CM | POA: Diagnosis not present

## 2019-01-30 DIAGNOSIS — F419 Anxiety disorder, unspecified: Secondary | ICD-10-CM | POA: Diagnosis not present

## 2019-01-30 MED FILL — SERTRALINE HCL 25 MG TABLET: 25 | 30 days supply | Qty: 30 | Fill #0

## 2019-02-21 DIAGNOSIS — D225 Melanocytic nevi of trunk: Secondary | ICD-10-CM | POA: Diagnosis not present

## 2019-02-21 DIAGNOSIS — L738 Other specified follicular disorders: Secondary | ICD-10-CM | POA: Diagnosis not present

## 2019-02-21 DIAGNOSIS — Z8546 Personal history of malignant neoplasm of prostate: Secondary | ICD-10-CM | POA: Diagnosis not present

## 2019-02-21 DIAGNOSIS — D2272 Melanocytic nevi of left lower limb, including hip: Secondary | ICD-10-CM | POA: Diagnosis not present

## 2019-02-21 DIAGNOSIS — Z85828 Personal history of other malignant neoplasm of skin: Secondary | ICD-10-CM | POA: Diagnosis not present

## 2019-02-21 DIAGNOSIS — L812 Freckles: Secondary | ICD-10-CM | POA: Diagnosis not present

## 2019-02-21 DIAGNOSIS — Z85820 Personal history of malignant melanoma of skin: Secondary | ICD-10-CM | POA: Diagnosis not present

## 2019-02-21 DIAGNOSIS — L821 Other seborrheic keratosis: Secondary | ICD-10-CM | POA: Diagnosis not present

## 2019-02-21 DIAGNOSIS — L570 Actinic keratosis: Secondary | ICD-10-CM | POA: Diagnosis not present

## 2019-02-21 MED FILL — SERTRALINE HCL 50 MG TABLET: 50 | 90 days supply | Qty: 90 | Fill #0

## 2019-02-27 MED FILL — TRULICITY 0.75 MG/0.5 ML PE: 0.75 | 28 days supply | Qty: 2 | Fill #2

## 2019-03-07 DIAGNOSIS — H524 Presbyopia: Secondary | ICD-10-CM | POA: Diagnosis not present

## 2019-03-07 DIAGNOSIS — E119 Type 2 diabetes mellitus without complications: Secondary | ICD-10-CM | POA: Diagnosis not present

## 2019-03-07 DIAGNOSIS — H33302 Unspecified retinal break, left eye: Secondary | ICD-10-CM | POA: Diagnosis not present

## 2019-03-07 DIAGNOSIS — H52203 Unspecified astigmatism, bilateral: Secondary | ICD-10-CM | POA: Diagnosis not present

## 2019-03-11 DIAGNOSIS — F419 Anxiety disorder, unspecified: Secondary | ICD-10-CM | POA: Diagnosis not present

## 2019-03-15 DIAGNOSIS — Z23 Encounter for immunization: Secondary | ICD-10-CM | POA: Diagnosis not present

## 2019-03-25 MED FILL — TRULICITY 0.75 MG/0.5 ML PE: 0.75 | 28 days supply | Qty: 2 | Fill #3

## 2019-04-08 DIAGNOSIS — M25562 Pain in left knee: Secondary | ICD-10-CM | POA: Insufficient documentation

## 2019-04-08 DIAGNOSIS — M1712 Unilateral primary osteoarthritis, left knee: Secondary | ICD-10-CM | POA: Diagnosis not present

## 2019-04-08 DIAGNOSIS — S83242A Other tear of medial meniscus, current injury, left knee, initial encounter: Secondary | ICD-10-CM | POA: Diagnosis not present

## 2019-04-17 MED FILL — metFORMIN HCL 500 MG TABS: 500 | 90 days supply | Qty: 180 | Fill #1

## 2019-04-17 MED FILL — ATORVASTATIN 10 MG TABLET: 10 | 90 days supply | Qty: 90 | Fill #1

## 2019-04-22 MED FILL — TRULICITY 0.75 MG/0.5 ML PE: 0.75 | 28 days supply | Qty: 2 | Fill #4

## 2019-04-26 ENCOUNTER — Encounter (HOSPITAL_BASED_OUTPATIENT_CLINIC_OR_DEPARTMENT_OTHER): Payer: Self-pay | Admitting: *Deleted

## 2019-04-26 ENCOUNTER — Other Ambulatory Visit: Payer: Self-pay

## 2019-04-26 NOTE — Progress Notes (Signed)
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 Patient denies shortness of breath, chest pain, fever, cough a this phone interview.

## 2019-05-01 ENCOUNTER — Other Ambulatory Visit (HOSPITAL_COMMUNITY)
Admission: RE | Admit: 2019-05-01 | Discharge: 2019-05-01 | Disposition: A | Discharge status: Home / Self Care | Payer: 59 | Source: Ambulatory Visit | Attending: Specialist | Admitting: Specialist

## 2019-05-01 DIAGNOSIS — Z20828 Contact with and (suspected) exposure to other viral communicable diseases: Secondary | ICD-10-CM | POA: Diagnosis not present

## 2019-05-01 DIAGNOSIS — Z01812 Encounter for preprocedural laboratory examination: Secondary | ICD-10-CM | POA: Diagnosis not present

## 2019-05-01 LAB — SARS CORONAVIRUS 2 (TAT 6-24 HRS): SARS Coronavirus 2: NEGATIVE

## 2019-05-02 ENCOUNTER — Encounter (HOSPITAL_BASED_OUTPATIENT_CLINIC_OR_DEPARTMENT_OTHER)
Admission: RE | Disposition: A | Discharge status: Home / Self Care | Payer: Self-pay | Source: Home / Self Care | Attending: Specialist

## 2019-05-02 ENCOUNTER — Ambulatory Visit (HOSPITAL_BASED_OUTPATIENT_CLINIC_OR_DEPARTMENT_OTHER): Payer: 59 | Admitting: Anesthesiology

## 2019-05-02 ENCOUNTER — Encounter (HOSPITAL_BASED_OUTPATIENT_CLINIC_OR_DEPARTMENT_OTHER): Payer: Self-pay | Admitting: Anesthesiology

## 2019-05-02 ENCOUNTER — Ambulatory Visit (HOSPITAL_BASED_OUTPATIENT_CLINIC_OR_DEPARTMENT_OTHER)
Admission: RE | Admit: 2019-05-02 | Discharge: 2019-05-02 | Disposition: A | Discharge status: Home / Self Care | Payer: 59 | Attending: Specialist | Admitting: Specialist

## 2019-05-02 ENCOUNTER — Other Ambulatory Visit: Payer: Self-pay

## 2019-05-02 DIAGNOSIS — E119 Type 2 diabetes mellitus without complications: Secondary | ICD-10-CM | POA: Insufficient documentation

## 2019-05-02 DIAGNOSIS — M2242 Chondromalacia patellae, left knee: Secondary | ICD-10-CM | POA: Insufficient documentation

## 2019-05-02 DIAGNOSIS — G4733 Obstructive sleep apnea (adult) (pediatric): Secondary | ICD-10-CM | POA: Diagnosis not present

## 2019-05-02 DIAGNOSIS — E785 Hyperlipidemia, unspecified: Secondary | ICD-10-CM | POA: Insufficient documentation

## 2019-05-02 DIAGNOSIS — K219 Gastro-esophageal reflux disease without esophagitis: Secondary | ICD-10-CM | POA: Insufficient documentation

## 2019-05-02 DIAGNOSIS — S83232A Complex tear of medial meniscus, current injury, left knee, initial encounter: Secondary | ICD-10-CM | POA: Insufficient documentation

## 2019-05-02 DIAGNOSIS — Z85820 Personal history of malignant melanoma of skin: Secondary | ICD-10-CM | POA: Insufficient documentation

## 2019-05-02 DIAGNOSIS — M94262 Chondromalacia, left knee: Secondary | ICD-10-CM | POA: Diagnosis not present

## 2019-05-02 DIAGNOSIS — Z85828 Personal history of other malignant neoplasm of skin: Secondary | ICD-10-CM | POA: Insufficient documentation

## 2019-05-02 DIAGNOSIS — Z79899 Other long term (current) drug therapy: Secondary | ICD-10-CM | POA: Diagnosis not present

## 2019-05-02 DIAGNOSIS — Z7984 Long term (current) use of oral hypoglycemic drugs: Secondary | ICD-10-CM | POA: Insufficient documentation

## 2019-05-02 DIAGNOSIS — X58XXXA Exposure to other specified factors, initial encounter: Secondary | ICD-10-CM | POA: Insufficient documentation

## 2019-05-02 DIAGNOSIS — Z8546 Personal history of malignant neoplasm of prostate: Secondary | ICD-10-CM | POA: Insufficient documentation

## 2019-05-02 DIAGNOSIS — M1712 Unilateral primary osteoarthritis, left knee: Secondary | ICD-10-CM | POA: Diagnosis not present

## 2019-05-02 DIAGNOSIS — G8918 Other acute postprocedural pain: Secondary | ICD-10-CM | POA: Diagnosis not present

## 2019-05-02 HISTORY — DX: Unspecified tear of unspecified meniscus, current injury, unspecified knee, initial encounter: S83.209A

## 2019-05-02 HISTORY — DX: Personal history of other malignant neoplasm of skin: Z85.828

## 2019-05-02 HISTORY — DX: Personal history of malignant melanoma of skin: Z85.820

## 2019-05-02 HISTORY — DX: Thrombocytopenia, unspecified: D69.6

## 2019-05-02 HISTORY — DX: Type 2 diabetes mellitus without complications: E11.9

## 2019-05-02 HISTORY — PX: KNEE ARTHROSCOPY: SHX127

## 2019-05-02 HISTORY — DX: Obstructive sleep apnea (adult) (pediatric): G47.33

## 2019-05-02 HISTORY — DX: Other allergy status, other than to drugs and biological substances: Z91.09

## 2019-05-02 HISTORY — DX: Personal history of other diseases of the nervous system and sense organs: Z86.69

## 2019-05-02 HISTORY — DX: Personal history of other malignant neoplasm of skin: Z98.890

## 2019-05-02 HISTORY — DX: Personal history of malignant neoplasm of prostate: Z85.46

## 2019-05-02 HISTORY — DX: Unspecified osteoarthritis, unspecified site: M19.90

## 2019-05-02 LAB — POCT I-STAT, CHEM 8
BUN: 15 mg/dL (ref 8–23)
Calcium, Ion: 1.34 mmol/L (ref 1.15–1.40)
Chloride: 101 mmol/L (ref 98–111)
Creatinine, Ser: 1 mg/dL (ref 0.61–1.24)
Glucose, Bld: 101 mg/dL — ABNORMAL HIGH (ref 70–99)
HCT: 43 % (ref 39.0–52.0)
Hemoglobin: 14.6 g/dL (ref 13.0–17.0)
Potassium: 3.8 mmol/L (ref 3.5–5.1)
Sodium: 141 mmol/L (ref 135–145)
TCO2: 26 mmol/L (ref 22–32)

## 2019-05-02 SURGERY — ARTHROSCOPY, KNEE
Anesthesia: General | Site: Knee | Laterality: Left

## 2019-05-02 MED ORDER — ONDANSETRON HCL 4 MG/2ML IJ SOLN
INTRAMUSCULAR | Status: AC
  Filled 2019-05-02: qty 2

## 2019-05-02 MED ORDER — DEXAMETHASONE SODIUM PHOSPHATE 10 MG/ML IJ SOLN
INTRAMUSCULAR | Status: AC
  Filled 2019-05-02: qty 1

## 2019-05-02 MED ORDER — CEPHALEXIN 500 MG PO CAPS: 500.0000 mg | ORAL_CAPSULE | Freq: Four times a day (QID) | ORAL | 0 refills | Status: AC

## 2019-05-02 MED ORDER — KETOROLAC TROMETHAMINE 30 MG/ML IJ SOLN
30.0000 mg | Freq: Once | INTRAMUSCULAR | Status: DC | PRN
  Filled 2019-05-02: qty 1

## 2019-05-02 MED ORDER — MIDAZOLAM HCL 5 MG/5ML IJ SOLN
INTRAMUSCULAR | Status: DC | PRN
  Administered 2019-05-02: 2 mg via INTRAVENOUS

## 2019-05-02 MED ORDER — FENTANYL CITRATE (PF) 100 MCG/2ML IJ SOLN
50.0000 ug | Freq: Once | INTRAMUSCULAR | Status: AC
  Administered 2019-05-02: 13:00:00 50 ug via INTRAVENOUS
  Filled 2019-05-02: qty 1

## 2019-05-02 MED ORDER — FENTANYL CITRATE (PF) 100 MCG/2ML IJ SOLN
INTRAMUSCULAR | Status: AC
  Filled 2019-05-02: qty 2

## 2019-05-02 MED ORDER — LIDOCAINE 2% (20 MG/ML) 5 ML SYRINGE
INTRAMUSCULAR | Status: AC
  Filled 2019-05-02: qty 5

## 2019-05-02 MED ORDER — MIDAZOLAM HCL 2 MG/2ML IJ SOLN
INTRAMUSCULAR | Status: AC
  Filled 2019-05-02: qty 2

## 2019-05-02 MED ORDER — PROPOFOL 10 MG/ML IV BOLUS
INTRAVENOUS | Status: AC
  Filled 2019-05-02: qty 20

## 2019-05-02 MED ORDER — CEFAZOLIN SODIUM-DEXTROSE 2-4 GM/100ML-% IV SOLN
INTRAVENOUS | Status: AC
  Filled 2019-05-02: qty 100

## 2019-05-02 MED ORDER — PROMETHAZINE HCL 25 MG/ML IJ SOLN
6.2500 mg | INTRAMUSCULAR | Status: DC | PRN
  Filled 2019-05-02: qty 1

## 2019-05-02 MED ORDER — BUPIVACAINE HCL 0.25 % IJ SOLN
INTRAMUSCULAR | Status: DC | PRN
  Administered 2019-05-02: 20 mL

## 2019-05-02 MED ORDER — LACTATED RINGERS IV SOLN
INTRAVENOUS | Status: DC
  Administered 2019-05-02: 1000 mL via INTRAVENOUS
  Filled 2019-05-02: qty 1000

## 2019-05-02 MED ORDER — CLONIDINE HCL (ANALGESIA) 100 MCG/ML EP SOLN
EPIDURAL | Status: DC | PRN
  Administered 2019-05-02: 100 ug

## 2019-05-02 MED ORDER — MIDAZOLAM HCL 2 MG/2ML IJ SOLN
1.0000 mg | Freq: Once | INTRAMUSCULAR | Status: AC
  Administered 2019-05-02: 1 mg via INTRAVENOUS
  Filled 2019-05-02: qty 1

## 2019-05-02 MED ORDER — ROPIVACAINE HCL 5 MG/ML IJ SOLN
INTRAMUSCULAR | Status: DC | PRN
  Administered 2019-05-02: 20 mL via PERINEURAL

## 2019-05-02 MED ORDER — MORPHINE SULFATE (PF) 4 MG/ML IV SOLN
INTRAVENOUS | Status: AC
  Filled 2019-05-02: qty 1

## 2019-05-02 MED ORDER — MORPHINE SULFATE (PF) 4 MG/ML IV SOLN
INTRAVENOUS | Status: DC | PRN
  Administered 2019-05-02: 4 mg via INTRAMUSCULAR

## 2019-05-02 MED ORDER — CEFAZOLIN SODIUM-DEXTROSE 2-4 GM/100ML-% IV SOLN
2.0000 g | INTRAVENOUS | Status: AC
  Administered 2019-05-02: 2 g via INTRAVENOUS
  Filled 2019-05-02: qty 100

## 2019-05-02 MED ORDER — PROPOFOL 10 MG/ML IV BOLUS
INTRAVENOUS | Status: DC | PRN
  Administered 2019-05-02: 150 mg via INTRAVENOUS

## 2019-05-02 MED ORDER — TRIAMCINOLONE ACETONIDE 40 MG/ML IJ SUSP
INTRAMUSCULAR | Status: AC
  Filled 2019-05-02: qty 1

## 2019-05-02 MED ORDER — CHLORHEXIDINE GLUCONATE 4 % EX LIQD
60.0000 mL | Freq: Once | CUTANEOUS | Status: DC
  Filled 2019-05-02: qty 118

## 2019-05-02 MED ORDER — HYDROCODONE-ACETAMINOPHEN 5-325 MG PO TABS: 1.0000 | ORAL_TABLET | ORAL | 0 refills | Status: DC | PRN

## 2019-05-02 MED ORDER — ONDANSETRON HCL 4 MG/2ML IJ SOLN
INTRAMUSCULAR | Status: DC | PRN
  Administered 2019-05-02: 4 mg via INTRAVENOUS

## 2019-05-02 MED ORDER — LIDOCAINE HCL (CARDIAC) PF 100 MG/5ML IV SOSY
PREFILLED_SYRINGE | INTRAVENOUS | Status: DC | PRN
  Administered 2019-05-02: 70 mg via INTRAVENOUS

## 2019-05-02 MED ORDER — FENTANYL CITRATE (PF) 100 MCG/2ML IJ SOLN
INTRAMUSCULAR | Status: DC | PRN
  Administered 2019-05-02: 50 ug via INTRAVENOUS

## 2019-05-02 MED ORDER — DEXAMETHASONE SODIUM PHOSPHATE 4 MG/ML IJ SOLN
INTRAMUSCULAR | Status: DC | PRN
  Administered 2019-05-02: 5 mg via INTRAVENOUS

## 2019-05-02 MED ORDER — SODIUM CHLORIDE 0.9 % IR SOLN
Status: DC | PRN
  Administered 2019-05-02: 12000 mL

## 2019-05-02 MED ORDER — FENTANYL CITRATE (PF) 100 MCG/2ML IJ SOLN
25.0000 ug | INTRAMUSCULAR | Status: DC | PRN
  Filled 2019-05-02: qty 1

## 2019-05-02 MED FILL — HYDROCODON-APAP 5-325: 5-325 | 3 days supply | Qty: 20 | Fill #0

## 2019-05-02 MED FILL — CEPHALEXIN 500 MG CAPSULE: 500 | 3 days supply | Qty: 12 | Fill #0

## 2019-05-02 SURGICAL SUPPLY — 43 items
ABLATOR ASPIRATE 50D MULTI-PRT (SURGICAL WAND) ×3 IMPLANT
BANDAGE ESMARK 6X9 LF (GAUZE/BANDAGES/DRESSINGS) ×1 IMPLANT
BLADE EXCALIBUR 4.0MM X 13CM (MISCELLANEOUS) ×1
BLADE EXCALIBUR 4.0X13 (MISCELLANEOUS) ×2 IMPLANT
BNDG ELASTIC 6X5.8 VLCR STR LF (GAUZE/BANDAGES/DRESSINGS) ×2 IMPLANT
BNDG ESMARK 6X9 LF (GAUZE/BANDAGES/DRESSINGS) ×3
BNDG GAUZE ELAST 4 BULKY (GAUZE/BANDAGES/DRESSINGS) ×3 IMPLANT
CANISTER SUCTION 1200CC (MISCELLANEOUS) ×1 IMPLANT
CONT SPEC 4OZ CLIKSEAL STRL BL (MISCELLANEOUS) IMPLANT
COVER WAND RF STERILE (DRAPES) ×3 IMPLANT
CUFF TOURN SGL QUICK 34 (TOURNIQUET CUFF)
CUFF TRNQT CYL 34X4.125X (TOURNIQUET CUFF) ×2 IMPLANT
DRAPE ARTHROSCOPY W/POUCH 114 (DRAPES) ×3 IMPLANT
DRAPE INCISE IOBAN 66X45 STRL (DRAPES) ×3 IMPLANT
DRAPE U-SHAPE 47X51 STRL (DRAPES) ×3 IMPLANT
DURAPREP 26ML APPLICATOR (WOUND CARE) ×3 IMPLANT
EXCALIBUR 3.8MM X 13CM (MISCELLANEOUS) ×3 IMPLANT
GAUZE SPONGE 4X4 12PLY STRL (GAUZE/BANDAGES/DRESSINGS) ×3 IMPLANT
GAUZE SPONGE 4X4 12PLY STRL LF (GAUZE/BANDAGES/DRESSINGS) ×2 IMPLANT
GAUZE XEROFORM 1X8 LF (GAUZE/BANDAGES/DRESSINGS) ×3 IMPLANT
GLOVE BIO SURGEON STRL SZ7.5 (GLOVE) ×3 IMPLANT
GLOVE BIO SURGEON STRL SZ8 (GLOVE) ×3 IMPLANT
GLOVE INDICATOR 8.0 STRL GRN (GLOVE) ×6 IMPLANT
GOWN STRL REUS W/TWL XL LVL3 (GOWN DISPOSABLE) ×6 IMPLANT
IMMOBILIZER KNEE 24 THIGH 36 (MISCELLANEOUS) IMPLANT
IMMOBILIZER KNEE 24 UNIV (MISCELLANEOUS) ×3
IV NS IRRIG 3000ML ARTHROMATIC (IV SOLUTION) ×6 IMPLANT
KIT TURNOVER CYSTO (KITS) ×3 IMPLANT
KNEE WRAP E Z 3 GEL PACK (MISCELLANEOUS) ×1 IMPLANT
MANIFOLD NEPTUNE II (INSTRUMENTS) ×3 IMPLANT
NEEDLE HYPO 22GX1.5 SAFETY (NEEDLE) ×3 IMPLANT
NS IRRIG 500ML POUR BTL (IV SOLUTION) ×2 IMPLANT
PACK ARTHROSCOPY DSU (CUSTOM PROCEDURE TRAY) ×3 IMPLANT
PACK BASIN DAY SURGERY FS (CUSTOM PROCEDURE TRAY) ×3 IMPLANT
PAD ABD 8X10 STRL (GAUZE/BANDAGES/DRESSINGS) ×2 IMPLANT
PAD ARMBOARD 7.5X6 YLW CONV (MISCELLANEOUS) IMPLANT
SUT ETHILON 4 0 PS 2 18 (SUTURE) ×3 IMPLANT
SYR CONTROL 10ML LL (SYRINGE) ×3 IMPLANT
TOWEL OR 17X26 10 PK STRL BLUE (TOWEL DISPOSABLE) ×6 IMPLANT
TUBE CONNECTING 12'X1/4 (SUCTIONS) ×4
TUBE CONNECTING 12X1/4 (SUCTIONS) ×5 IMPLANT
TUBING ARTHROSCOPY IRRIG 16FT (MISCELLANEOUS) ×3 IMPLANT
WATER STERILE IRR 500ML POUR (IV SOLUTION) ×1 IMPLANT

## 2019-05-02 NOTE — H&P (Signed)
Timothy [name] is an 63 y.o. male.   Chief Complaint: left knee pain HPI: Patient presented to the office for left knee pain that has been occurring for some time now. He has tried conservative management for it. He had an MRI done that reveled a medial meniscal tear. Patient elected to procedure with surgery for this injury.    Past Medical History:  Diagnosis Date  . Acute meniscal tear of knee   . Environmental allergies   . GERD (gastroesophageal reflux disease)   . History of basal cell carcinoma (BCC) excision   . History of detached retina repair    left s/p laser 2019;  right s/p surgical repair 02/ 2015  . History of malignant melanoma of skin    04-26-2019 per pt fall 2019 excision from back, localized, and no recurrence  . History of prostate cancer urologist --- dr Alinda Money   dx 2016-- Stage T1c, Gleason 3+3;  05-11-2015 s/p  radical prostatectomy  (04-26-2019 per pt psa undetectable)  . Hx of adenomatous colonic polyps 01/18/2017  . Hyperlipidemia   . Mild obstructive sleep apnea    no longer has to use CPAP wt. loss (pt retested 10-30-2014 epic)  . OA (osteoarthritis)   . Thrombocytopenia, unspecified ([place])    04-26-2019  per pt baseline plt count 150  . Type 2 diabetes mellitus ([place])    followed by pcp  (04-26-2019 check's cbg daily,  fasting cbg-- 110-120;  per pt last A1c 5.8)  . Wears glasses   . Wears glasses     Past Surgical History:  Procedure Laterality Date  . CATARACT EXTRACTION W/ INTRAOCULAR LENS  IMPLANT, BILATERAL  01/2018  . COLONOSCOPY  last one 01-12-2017  . GAS INSERTION Right 08/19/2013   Procedure: INSERTION OF GAS;  Surgeon: Hurman Horn, MD;  Location: [facility];  Service: Ophthalmology;  Laterality: Right;  SF6  . MASS EXCISION Left 07/08/2014   Procedure: EXCISION OF KERATOACANTHOMA LEFT LOWER LEG, AND EXCISION OF BASIL CELL CARCINOMA FROM NECK;  Surgeon: Armandina Gemma, MD;  Location: [facility];  Service: General;  Laterality: Left;   left lower leg and posterior neck  . NASAL SEPTOPLASTY W/ TURBINOPLASTY  12-13-2001  dr byers @[facility]   . PROSTATE BIOPSY  11/11/14  . ROBOT ASSISTED LAPAROSCOPIC RADICAL PROSTATECTOMY N/A 05/11/2015   Procedure: ROBOTIC ASSISTED LAPAROSCOPIC RADICAL PROSTATECTOMY LEVEL 1;  Surgeon: Raynelle Bring, MD;  [facility];  Service: Urology;  Laterality: N/A;  . SCLERAL BUCKLE WITH CRYO Right 08/19/2013   Procedure: SCLERAL BUCKLE WITH CRYOPEXY;  Surgeon: Hurman Horn, MD;  Location: [facility];  Service: Ophthalmology;  Laterality: Right;  . TONSILLECTOMY AND ADENOIDECTOMY  [place]    Family History  Problem Relation Age of Onset  . Dementia Father   . Bowel Disease Father        ischemic bowl disease  . Kidney Stones Mother   . Colon cancer Neg Hx   . Colon polyps Neg Hx   . Esophageal cancer Neg Hx   . Rectal cancer Neg Hx   . Stomach cancer Neg Hx    Social History:  reports that he has never smoked. He has never used smokeless tobacco. He reports current alcohol use. He reports that he does not use drugs.  Allergies:  Allergies  Allergen Reactions  . Sporanox [Itraconazole] Rash  . Ace Inhibitors Other (See Comments)    REACTION: COUGH    Medications Prior to Admission  Medication Sig Dispense Refill  .  atorvastatin (LIPITOR) 10 MG tablet Take 10 mg by mouth daily.   3  . cetirizine (ZYRTEC) 10 MG tablet Take 10 mg by mouth daily.    . famotidine (PEPCID) 20 MG tablet Take 10 mg by mouth daily as needed for heartburn or indigestion.     . fluticasone (FLONASE) 50 MCG/ACT nasal spray Place 2 sprays into both nostrils daily.    [name] Kitchen glucose blood (FREESTYLE TEST STRIPS) test strip 1 each daily as needed.      . Lancets (FREESTYLE) lancets 1 each daily as needed.      . metFORMIN (GLUCOPHAGE) 500 MG tablet Take 500 mg by mouth 2 (two) times daily with a meal.     . Multiple Vitamins-Minerals (CENTRUM SILVER 50+MEN) TABS Take 1 tablet by mouth daily.    . Omega-3 Fatty Acids (OMEGA-3 FISH  OIL PO) Take 1 capsule by mouth daily.    . sertraline (ZOLOFT) 50 MG tablet Take 50 mg by mouth daily.    . TRULICITY [id number] [id number] SOPN Inject 0.75 mg into the skin once a week. Monday's  11  . TURMERIC PO Take 1 capsule by mouth daily.    . vitamin C (ASCORBIC ACID) 500 MG tablet Take 500 mg by mouth daily.    . Zinc 50 MG TABS Take 1 tablet by mouth daily.    . Multiple Vitamin (MULTIVITAMIN) tablet Take 1 tablet by mouth daily.      Results for orders placed or performed during the hospital encounter of 05/02/19 (from the past 48 hour(s))  I-STAT, chem 8     Status: Abnormal   Collection Time: 05/02/19 12:37 PM  Result Value Ref Range   Sodium 141 135 - 145 mmol/L   Potassium 3.8 3.5 - 5.1 mmol/L   Chloride 101 98 - 111 mmol/L   BUN 15 8 - 23 mg/dL   Creatinine, Ser 1.00 0.61 - 1.24 mg/dL   Glucose, Bld 101 (H) 70 - 99 mg/dL   Calcium, Ion 1.34 1.15 - 1.40 mmol/L   TCO2 26 22 - 32 mmol/L   Hemoglobin 14.6 13.0 - 17.0 g/dL   HCT 43.0 39.0 - 52.0 %   No results found.  Review of Systems  All other systems reviewed and are negative.   Blood pressure 103/71, pulse 61, temperature (!) 97.5 F (36.4 C), temperature source Oral, resp. rate 13, height 6' (1.829 m), weight 80.1 kg, SpO2 100 %. Physical Exam  Constitutional: He appears well-developed and well-nourished.  HENT:  Head: Atraumatic.  Eyes: EOM are normal.  Neck: Normal range of motion.  Musculoskeletal:     Comments: Patient tender to palpation along medial joint line.  Full ROM of left knee Pain with McMurry Negative [place]man No laxity with varus or valgus pressure NVI in left lower extremity     Assessment/Plan Left Medial meniscal tear Left knee Osteoarthritis  Patient has left knee Medial meniscal tear and left knee OA, both symptomatic at this time. Surgical vs nonsurgical management was discussed with the patient. Risks and benefits of both were explained. He elected to proceed with surgery at this  time. He will be having a left knee arthroscopy EUA, Debridement, Partial medial meniscectomy and chondroplasty. All questions were answered for patient.   Drue Novel, PA 05/02/2019, 2:12 PM

## 2019-05-02 NOTE — Interval H&P Note (Signed)
History and Physical Interval Note:  05/02/2019 2:26 PM  Timothy [name]  has presented today for surgery, with the diagnosis of Left knee torn medial meniscus  Osteoarthritis.  The various methods of treatment have been discussed with the patient and family. After consideration of risks, benefits and other options for treatment, the patient has consented to  Procedure(s) with comments: ARTHROSCOPY KNEE evaluation under anesthesia debridement partial medial meniscectomy chondroplasty (Left) - Anesthesia - Femoral nerve block/knee block/IV sedation as a surgical intervention.  The patient's history has been reviewed, patient examined, no change in status, stable for surgery.  I have reviewed the patient's chart and labs.  Questions were answered to the patient's satisfaction.     Timothy [name]

## 2019-05-02 NOTE — Op Note (Signed)
[facility]

## 2019-05-02 NOTE — Discharge Instructions (Signed)
°  Post Anesthesia Home Care Instructions  Activity: Get plenty of rest for the remainder of the day. A responsible individual must stay with you for 24 hours following the procedure.  For the next 24 hours, DO NOT: -Drive a car -Paediatric nurse -Drink alcoholic beverages -Take any medication unless instructed by your physician -Make any legal decisions or sign important papers.  Meals: Start with liquid foods such as gelatin or soup. Progress to regular foods as tolerated. Avoid greasy, spicy, heavy foods. If nausea and/or vomiting occur, drink only clear liquids until the nausea and/or vomiting subsides. Call your physician if vomiting continues.  Special Instructions/Symptoms: Your throat may feel dry or sore from the anesthesia or the breathing tube placed in your throat during surgery. If this causes discomfort, gargle with warm salt water. The discomfort should disappear within 24 hours.  Call your surgeon if you experience:   1.  Fever over 101.0. 2.  Inability to urinate. 3.  Nausea and/or vomiting. 4.  Extreme swelling or bruising at the surgical site. 5.  Continued bleeding from the incision. 6.  Increased pain, redness or drainage from the incision. 7.  Problems related to your pain medication. 8.  Any problems and/or concerns. 9. Any changes in color or sensation to the foot on the operative leg.

## 2019-05-02 NOTE — Anesthesia Procedure Notes (Signed)
Procedure Name: LMA Insertion Date/Time: 05/02/2019 2:36 PM Performed by: Suan Halter, CRNA Pre-anesthesia Checklist: Patient identified, Emergency Drugs available, Suction available and Patient being monitored Patient Re-evaluated:Patient Re-evaluated prior to induction Oxygen Delivery Method: Circle system utilized Preoxygenation: Pre-oxygenation with 100% oxygen Induction Type: IV induction Ventilation: Mask ventilation without difficulty LMA: LMA inserted LMA Size: 4.0 Number of attempts: 1 Airway Equipment and Method: Bite block Placement Confirmation: positive ETCO2 Tube secured with: Tape Dental Injury: Teeth and Oropharynx as per pre-operative assessment

## 2019-05-02 NOTE — Progress Notes (Signed)
Assisted Dr. Kalman Shan with adductor canal block. Side rails up, monitors on throughout procedure. See vital signs in flow sheet. Tolerated Procedure well.

## 2019-05-02 NOTE — Anesthesia Preprocedure Evaluation (Addendum)
Anesthesia Evaluation  Patient identified by MRN, date of birth, ID band Patient awake    Reviewed: Allergy & Precautions, NPO status , Patient's Chart, lab work & pertinent test results  Airway Mallampati: II  TM Distance: >3 FB Neck ROM: Full    Dental no notable dental hx. (+) Teeth Intact, Dental Advisory Given   Pulmonary neg pulmonary ROS,    Pulmonary exam normal breath sounds clear to auscultation       Cardiovascular negative cardio ROS Normal cardiovascular exam Rhythm:Regular Rate:Normal     Neuro/Psych negative neurological ROS  negative psych ROS   GI/Hepatic Neg liver ROS, GERD  ,  Endo/Other  diabetes  Renal/GU negative Renal ROS  negative genitourinary   Musculoskeletal  (+) Arthritis , Osteoarthritis,    Abdominal   Peds negative pediatric ROS (+)  Hematology negative hematology ROS (+)   Anesthesia Other Findings   Reproductive/Obstetrics negative OB ROS                           Anesthesia Physical Anesthesia Plan  ASA: II  Anesthesia Plan: General   Post-op Pain Management:  Regional for Post-op pain   Induction: Intravenous  PONV Risk Score and Plan: 2 and Ondansetron, Dexamethasone and Treatment may vary due to age or medical condition  Airway Management Planned: LMA  Additional Equipment:   Intra-op Plan:   Post-operative Plan: Extubation in OR  Informed Consent: I have reviewed the patients History and Physical, chart, labs and discussed the procedure including the risks, benefits and alternatives for the proposed anesthesia with the patient or authorized representative who has indicated his/her understanding and acceptance.     Dental advisory given  Plan Discussed with: CRNA and Surgeon  Anesthesia Plan Comments:         Anesthesia Quick Evaluation

## 2019-05-02 NOTE — Anesthesia Procedure Notes (Signed)
Anesthesia Regional Block: Adductor canal block   Pre-Anesthetic Checklist: ,, timeout performed, Correct Patient, Correct Site, Correct Laterality, Correct Procedure, Correct Position, site marked, Risks and benefits discussed,  Surgical consent,  Pre-op evaluation,  At surgeon's request and post-op pain management  Laterality: Left  Prep: chloraprep       Needles:  Injection technique: Single-shot  Needle Type: Echogenic Needle     Needle Length: 9cm      Additional Needles:   Procedures:,,,, ultrasound used (permanent image in chart),,,,  Narrative:  Start time: 05/02/2019 1:11 PM End time: 05/02/2019 1:19 PM Injection made incrementally with aspirations every 5 mL.  Performed by: Personally   Additional Notes: Patient tolerated the procedure well without complications

## 2019-05-02 NOTE — Op Note (Signed)
NAME: Timothy [name], SAUBER MEDICAL RECORD [id number] ACCOUNT [id number] DATE OF BIRTH:1956/02/20 FACILITY: [facility] LOCATION: [facility] PHYSICIAN:Masaichi Kracht Gwinda Passe, MD  OPERATIVE REPORT  DATE OF PROCEDURE:  05/02/2019  PREOPERATIVE DIAGNOSES:   1.  Left knee torn medial meniscus. 2.  Chondromalacia. 3.  Early osteoarthritis.  POSTOPERATIVE DIAGNOSES: 1.  Left knee complex tear of medial meniscus. 2.  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  PROCEDURE: 1.  Left knee arthroscopic partial medial meniscectomy. 2.  Chondroplasty of patella, medial femoral condyle.  SURGEON:  Hart Robinsons, MD   ASSISTANT:  Secundino Ginger Hauss, PA-C.  ANESTHESIA:  General with adductor canal block.  ESTIMATED BLOOD LOSS:  Minimal.  DRAINS:  None.  COMPLICATIONS:  None.  TOURNIQUET TIME:  None.  DISPOSITION:  PACU stable.  DESCRIPTION OF PROCEDURE:  The patient was counseled in holding area, correct site was identified, marked and signed appropriately.  IV started.  A skin block was administered.  TED hose applied to the uninvolved leg and chart was signed.  IV antibiotics  were given.  He was then taken to the operating room and placed in supine position under general anesthesia.  Left thigh was placed in a thigh holder, prepped and draped in sterile fashion.  Time-out done, confirming the left side.  Arthroscopic portals  were established, proximal medial, inferomedial, inferolateral.  Diagnostic arthroscopy revealed normal tracking of patellofemoral joint.  There was grade III chondromalacia of patella.  Chondroplasty was performed with a shaver back to stable base.   Mild grade II femoral trochlea did not require chondroplasty.  ACL and PCL were intact.  Suprapatellar pouch, medial and lateral gutters were unremarkable.  Lateral side was inspected.  Articular cartilage was healthy, as was the lateral meniscus.   Medial side was  inspected.  Grade III+ chondromalacia of femoral condyle.  Shaving chondroplasty was shaved back to stable base.  Complex tear of the posterior one-third of the medial meniscus, large pedunculated flap, flipped underneath the meniscal  tibial ligament.  At this point in time, utilizing basket, motorized shaver, cautery, a partial medial meniscectomy was performed back to a stable base, properly beveled and contoured.  There were no other abnormalities noted.  The knee was irrigated and  arthroscopic equipment was removed.  Ten mL of Sensorcaine placed into the skin and 10 mL of Sensorcaine and 4 mg morphine sulfate was injected intraarticular.  Sterile dressing was applied.  TED hose, ice pack.  He was then awakened and taken from the  operating room to the PACU in stable condition.  He will be stabilized in PACU and discharged home.  He will be seen back in the office in a couple of weeks and start physical therapy next week.  No complications or problems.  VN/NUANCE  D:05/02/2019 T:05/02/2019 JOB:[id number]

## 2019-05-02 NOTE — Transfer of Care (Signed)
Immediate Anesthesia Transfer of Care Note  Patient: Timothy [name]  Procedure(s) Performed: Procedure(s) (LRB): ARTHROSCOPY KNEE evaluation under anesthesia debridement partial medial meniscectomy chondroplasty (Left)  Patient Location: PACU  Anesthesia Type: General  Level of Consciousness: awake, oriented, sedated and patient cooperative  Airway & Oxygen Therapy: Patient Spontanous Breathing and Patient connected to face mask oxygen  Post-op Assessment: Report given to PACU RN and Post -op Vital signs reviewed and stable  Post vital signs: Reviewed and stable  Complications: No apparent anesthesia complications Last Vitals:  Vitals Value Taken Time  BP 109/70 05/02/19 1534  Temp    Pulse 61 05/02/19 1537  Resp 12 05/02/19 1537  SpO2 100 % 05/02/19 1537  Vitals shown include unvalidated device data.  Last Pain:  Vitals:   05/02/19 1335  TempSrc:   PainSc: 0-No pain      Patients Stated Pain Goal: 3 (05/02/19 1217)

## 2019-05-02 NOTE — H&P (View-Only) (Signed)
Assisted Dr. Kalman Shan with adductor canal block. Side rails up, monitors on throughout procedure. See vital signs in flow sheet. Tolerated Procedure well.

## 2019-05-02 NOTE — Anesthesia Procedure Notes (Signed)
Anesthesia Procedure Image    

## 2019-05-03 ENCOUNTER — Encounter (HOSPITAL_BASED_OUTPATIENT_CLINIC_OR_DEPARTMENT_OTHER): Payer: Self-pay | Admitting: Specialist

## 2019-05-03 DIAGNOSIS — F419 Anxiety disorder, unspecified: Secondary | ICD-10-CM | POA: Diagnosis not present

## 2019-05-03 NOTE — Anesthesia Postprocedure Evaluation (Signed)
Anesthesia Post Note  Patient: Timothy [name]  Procedure(s) Performed: ARTHROSCOPY KNEE evaluation under anesthesia debridement partial medial meniscectomy chondroplasty (Left Knee)     Patient location during evaluation: PACU Anesthesia Type: General Level of consciousness: awake and alert Pain management: pain level controlled Vital Signs Assessment: post-procedure vital signs reviewed and stable Respiratory status: spontaneous breathing, nonlabored ventilation, respiratory function stable and patient connected to nasal cannula oxygen Cardiovascular status: blood pressure returned to baseline and stable Postop Assessment: no apparent nausea or vomiting Anesthetic complications: no    Last Vitals:  Vitals:   05/02/19 1615 05/02/19 1648  BP: 99/66 102/69  Pulse: (!) 58 60  Resp: 13 14  Temp:  36.8 C  SpO2: 96% 96%    Last Pain:  Vitals:   05/02/19 1648  TempSrc:   PainSc: [place]

## 2019-05-06 ENCOUNTER — Encounter: Payer: Self-pay | Admitting: Physical Therapy

## 2019-05-06 ENCOUNTER — Other Ambulatory Visit: Payer: Self-pay

## 2019-05-06 ENCOUNTER — Ambulatory Visit: Payer: 59 | Attending: Internal Medicine | Admitting: Physical Therapy

## 2019-05-06 DIAGNOSIS — M25562 Pain in left knee: Secondary | ICD-10-CM | POA: Insufficient documentation

## 2019-05-06 DIAGNOSIS — M25662 Stiffness of left knee, not elsewhere classified: Secondary | ICD-10-CM | POA: Insufficient documentation

## 2019-05-06 NOTE — Therapy (Signed)
[place], [place], [address] Phone: [phone]   Fax:  [phone]  Physical Therapy Evaluation  Patient Details  Name: Timothy [name] MRN: [id number] Date of Birth: April 23, 1956 Referring Provider (PT): Sydnee Cabal, MD   Encounter Date: 05/06/2019  PT End of Session - 05/06/19 1032    Visit Number  1    Number of Visits  8    Date for PT Re-Evaluation  06/17/19    Authorization Type  [facility]    PT Start Time  1016    PT Stop Time  1058    PT Time Calculation (min)  42 min    Activity Tolerance  Patient tolerated treatment well    Behavior During Therapy  [facility] for tasks assessed/performed       Past Medical History:  Diagnosis Date  . Acute meniscal tear of knee   . Environmental allergies   . GERD (gastroesophageal reflux disease)   . History of basal cell carcinoma (BCC) excision   . History of detached retina repair    left s/p laser 2019;  right s/p surgical repair 02/ 2015  . History of malignant melanoma of skin    04-26-2019 per pt fall 2019 excision from back, localized, and no recurrence  . History of prostate cancer urologist --- dr Alinda Money   dx 2016-- Stage T1c, Gleason 3+3;  05-11-2015 s/p  radical prostatectomy  (04-26-2019 per pt psa undetectable)  . Hx of adenomatous colonic polyps 01/18/2017  . Hyperlipidemia   . Mild obstructive sleep apnea    no longer has to use CPAP wt. loss (pt retested 10-30-2014 epic)  . OA (osteoarthritis)   . Thrombocytopenia, unspecified ([place])    04-26-2019  per pt baseline plt count 150  . Type 2 diabetes mellitus ([place])    followed by pcp  (04-26-2019 check's cbg daily,  fasting cbg-- 110-120;  per pt last A1c 5.8)  . Wears glasses   . Wears glasses     Past Surgical History:  Procedure Laterality Date  . CATARACT EXTRACTION W/ INTRAOCULAR LENS  IMPLANT, BILATERAL  01/2018  . COLONOSCOPY  last one 01-12-2017  . GAS INSERTION Right 08/19/2013   Procedure: INSERTION OF GAS;  Surgeon: Hurman Horn, MD;  Location: [place];  Service: Ophthalmology;  Laterality: Right;  SF6  . KNEE ARTHROSCOPY Left 05/02/2019   Procedure: ARTHROSCOPY KNEE evaluation under anesthesia debridement partial medial meniscectomy chondroplasty;  Surgeon: Sydnee Cabal, MD;  Location[facility];  Service: Orthopedics;  Laterality: Left;  Anesthesia - Femoral nerve block/knee block/IV sedation  . MASS EXCISION Left 07/08/2014   Procedure: EXCISION OF KERATOACANTHOMA LEFT LOWER LEG, AND EXCISION OF BASIL CELL CARCINOMA FROM NECK;  Surgeon: Armandina Gemma, MD;  Location: [place];  Service: General;  Laterality: Left;  left lower leg and posterior neck  . NASAL SEPTOPLASTY W/ TURBINOPLASTY  12-13-2001  dr byers @[facility]   . PROSTATE BIOPSY  11/11/14  . ROBOT ASSISTED LAPAROSCOPIC RADICAL PROSTATECTOMY N/A 05/11/2015   Procedure: ROBOTIC ASSISTED LAPAROSCOPIC RADICAL PROSTATECTOMY LEVEL 1;  Surgeon: Raynelle Bring, MD;  Location: [facility];  Service: Urology;  Laterality: N/A;  . SCLERAL BUCKLE WITH CRYO Right 08/19/2013   Procedure: SCLERAL BUCKLE WITH CRYOPEXY;  Surgeon: Hurman Horn, MD;  Location: [place];  Service: Ophthalmology;  Laterality: Right;  . TONSILLECTOMY AND ADENOIDECTOMY  [place]    There were no vitals filed for this visit.   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 Original onset of pain was in January 2019 associated with fall while hiking where pt. hyperflexed his knee and felt a pop. Pain persisted/worsened with MRI 5/19 revealing meniscus tear. Currently pain very mild. Pt. has been wearing knee brace but otherwise ambulates without AD. He will be out of work this week with plans on return part time next week and full schedule the week after.    Pertinent History  diabetic    Limitations   Standing;Walking;House hold activities    Diagnostic tests  MRI    Currently in Pain?  Yes    Pain Score  --   0-1/10   Pain Location  Knee    Pain Orientation  Left    Pain Descriptors / Indicators  Sharp    Pain Type  Surgical pain    Pain Onset  In the past 7 days    Pain Frequency  Intermittent    Aggravating Factors   walking, stairs, standing    Pain Relieving Factors  rest, ice, medication         [facility] PT Assessment - 05/06/19 0001      Assessment   Medical Diagnosis  s/p left knee arthroscopic surgery-partial medial meniscectomy, chondroplasty    Referring Provider (PT)  Sydnee Cabal, MD    Onset Date/Surgical Date  05/02/19    Prior Therapy  none      Precautions   Precautions  None      Restrictions   Weight Bearing Restrictions  No   WBAT LLE     Balance Screen   Has the patient fallen in the past 6 months  No      Prior Function   Level of Independence  Independent with community mobility without device      Cognition   Overall Cognitive Status  Within Functional Limits for tasks assessed      Observation/Other Assessments   Observations  --   bandages over arthroscopic portal but appears healing well   Focus on Therapeutic Outcomes (FOTO)   36% Limited      Observation/Other Assessments-Edema    Edema  Circumferential      Circumferential Edema   Circumferential - Right  36.5 cm    Circumferential - Left   37 cm      ROM / Strength   AROM / PROM / Strength  AROM;Strength      AROM   AROM Assessment Site  Knee    Right/Left Knee  Right;Left    Right Knee Extension  0    Right Knee Flexion  145    Left Knee Extension  0    Left Knee Flexion  125      Strength   Overall Strength Comments  Left side MMTs not tested given recent post-op status    Strength Assessment Site  Hip;Knee    Right/Left Hip  Right;Left    Right Hip Flexion  5/5    Right Hip External Rotation   5/5    Right Hip Internal Rotation  5/5    Right/Left Knee   Right;Left    Right Knee Flexion  5/5    Right Knee Extension  5/5      Flexibility   Soft Tissue Assessment /Muscle Length  --   hamstring tightness, SLR 70 deg left, 80 deg right  Objective measurements completed on examination: See above findings.      [place] Adult PT Treatment/Exercise - 05/06/19 0001      Exercises   Exercises  Knee/Hip      Knee/Hip Exercises: Stretches   Other Knee/Hip Stretches  Reviewed HEP handout including standing gastroc stretch, seated hamstring stretch      Knee/Hip Exercises: Supine   Quad Sets  AROM;Left;1 set;10 reps    Heel Slides  AROM;Left;10 reps    Straight Leg Raises  AROM;Left;10 reps    Straight Leg Raises Limitations  able without quad lag      Knee/Hip Exercises: Sidelying   Hip ABduction  AROM;Left;10 reps      Knee/Hip Exercises: Prone   Hip Extension  AROM;Left;10 reps             PT Education - 05/06/19 1029    Education Details  HEP, POC, brace use (OK to wean off as tolerated), gym-OK to work upper body but limit to PT exercises and recumbent stationary bike for lower body, ice prn    Person(s) Educated  Patient    Methods  Verbal cues;Handout;Explanation    Comprehension  Verbalized understanding;Returned demonstration          PT Long Term Goals - 05/06/19 1039      PT LONG TERM GOAL #1   Title  Independent with HEP    Baseline  needs HEP    Time  6    Period  Weeks    Status  New    Target Date  06/17/19      PT LONG TERM GOAL #2   Title  Improve FOTO outcome measure score to 27% or less impairment    Baseline  36% limited    Time  6    Period  Weeks    Status  New    Target Date  06/17/19      PT LONG TERM GOAL #3   Title  Left knee strength 5/5 to improve ability to navigate stairs and perform transfers from low seating    Baseline  MMT not tested at eval given recent post-op status    Time  6    Period  Weeks    Status  New    Target Date  06/17/19      PT LONG  TERM GOAL #4   Title  Increase left knee flexion 10 deg or greater to improve ability for stair navigation, participation in independent exercise with yoga classes    Baseline  125 deg    Time  6    Period  Weeks    Status  New    Target Date  06/17/19             Plan - 05/06/19 1032    Clinical Impression Statement  Pt. presents 5 days post-op s/p left knee arthroscopic surgery for partial medial meniscectomy and chondroplasty. Mild stiffness and swelling limiting left knee flexion ROM but overall pt. doing well with ROM status and muscle activation for left quadricep and functional status for mobility. Pt. would benefit from PT to address muscle weakness and ROM limitations s/p surgery to improve functional status for mobility.    Personal Factors and Comorbidities  Comorbidity 1    Comorbidities  diabetic    Examination-Activity Limitations  Stairs;Locomotion Level;Squat;Lift;Stand    Examination-Participation Restrictions  Shop;Community Activity   work as Hotel manager  Stable/Uncomplicated    Clinical Decision Making  Low    Rehab Potential  Excellent    PT Frequency  --   1-2x/week   PT Duration  6 weeks    PT Treatment/Interventions  ADLs/Self Care Home Management;Cryotherapy;Electrical Stimulation;Therapeutic activities;Therapeutic exercise;Gait training;Functional mobility training;Stair training;Neuromuscular re-education;Manual techniques;Passive range of motion;Vasopneumatic Device    PT Next Visit Plan  Recumbent bike warm up, review HEP as needed, for next follow up in 2-3 weeks plan add/progress to gentle closed kinetic chain exercises with standing TKE, partial/mini squat, step ups with small step, standing SLRs    PT Home Exercise Plan  quad sets, supine SLR, hip abd and ext SLR, heel slides, hip add. isometric, gastroc and hamstring stretches, heel raises    Consulted and Agree with Plan of Care  Patient       Patient will  benefit from skilled therapeutic intervention in order to improve the following deficits and impairments:  Difficulty walking, Pain, Impaired flexibility, Decreased strength, Decreased range of motion, Increased edema  Visit Diagnosis: Left knee pain, unspecified chronicity  Stiffness of left knee, not elsewhere classified     Problem List Patient Active Problem List   Diagnosis Date Noted  . Hx of adenomatous colonic polyps 01/18/2017  . Prostate cancer ([place]) 05/11/2015  . Malignant neoplasm of prostate ([place]) 12/18/2014  . Cough 09/10/2014  . Keratoacanthoma of lower leg, 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  . Basal cell carcinoma of neck 04/24/2014  . Haglund's deformity of right heel 01/16/2014  . FOOT PAIN, BILATERAL 04/12/2010  . NEOPLASM OF UNCERTAIN BEHAVIOR OF SKIN 10/28/2009  . HYPOGONADISM 10/28/2009  . OTITIS EXTERNA 10/28/2009  . CERUMEN IMPACTION 10/28/2009  . ACHILLES TENDINITIS 02/11/2009  . PLANTAR FASCIITIS, RIGHT 02/11/2009  . ABNORMAL LABS 08/15/2008  . ABNORMAL LIVER FUNCTION TESTS 08/15/2008  . OBSTRUCTIVE SLEEP APNEA 10/30/2007  . DIABETES MELLITUS, TYPE II 02/03/2007  . DYSLIPIDEMIA 02/03/2007  . DISORDERS, 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  . ASTHMA 02/03/2007  . MICROALBUMINURIA 02/03/2007    Beaulah Dinning, PT, DPT 05/06/19 10:46 AM  [facility] [address] [place], [place], [address] Phone: [phone]   Fax:  [phone]  Name: Timothy [name] MRN: [id number] Date of Birth: 1955-09-19

## 2019-05-08 ENCOUNTER — Ambulatory Visit: Payer: 59 | Admitting: Physical Therapy

## 2019-05-10 ENCOUNTER — Ambulatory Visit: Payer: 59 | Admitting: Physical Therapy

## 2019-05-17 MED FILL — ACCU-CHEK FASTCLIX LANCETS: 90 days supply | Qty: 204 | Fill #0

## 2019-05-17 MED FILL — ACCU-CHEK GUIDE STRP: 90 days supply | Qty: 200 | Fill #0

## 2019-05-17 MED FILL — ESCITALOPRAM 10 MG TABLET: 10 | 30 days supply | Qty: 30 | Fill #0

## 2019-05-17 MED FILL — TRULICITY 0.75 MG/0.5 ML PE: 0.75 | 28 days supply | Qty: 2 | Fill #5

## 2019-05-27 DIAGNOSIS — M65331 Trigger finger, right middle finger: Secondary | ICD-10-CM | POA: Insufficient documentation

## 2019-05-27 DIAGNOSIS — M79644 Pain in right finger(s): Secondary | ICD-10-CM | POA: Insufficient documentation

## 2019-05-28 DIAGNOSIS — Z760 Encounter for issue of repeat prescription: Secondary | ICD-10-CM | POA: Diagnosis not present

## 2019-05-30 ENCOUNTER — Encounter: Payer: Self-pay | Admitting: Physical Therapy

## 2019-05-30 ENCOUNTER — Ambulatory Visit: Payer: 59 | Attending: Internal Medicine | Admitting: Physical Therapy

## 2019-05-30 ENCOUNTER — Other Ambulatory Visit: Payer: Self-pay

## 2019-05-30 DIAGNOSIS — M25662 Stiffness of left knee, not elsewhere classified: Secondary | ICD-10-CM | POA: Insufficient documentation

## 2019-05-30 DIAGNOSIS — M25562 Pain in left knee: Secondary | ICD-10-CM | POA: Diagnosis not present

## 2019-05-30 NOTE — Therapy (Signed)
[place], [place], [address] Phone: [phone]   Fax:  [phone]  Physical Therapy Treatment  Patient Details  Name: Timothy [name] MRN: [id number] Date of Birth: 03-17-1956 Referring Provider (PT): Sydnee Cabal, MD   Encounter Date: 05/30/2019  PT End of Session - 05/30/19 0847    Visit Number  2    Number of Visits  8    Date for PT Re-Evaluation  06/17/19    Authorization Type  [facility]    PT Start Time  0759    PT Stop Time  0842    PT Time Calculation (min)  43 min    Activity Tolerance  Patient tolerated treatment well    Behavior During Therapy  [facility] for tasks assessed/performed       Past Medical History:  Diagnosis Date  . Acute meniscal tear of knee   . Environmental allergies   . GERD (gastroesophageal reflux disease)   . History of basal cell carcinoma (BCC) excision   . History of detached retina repair    left s/p laser 2019;  right s/p surgical repair 02/ 2015  . History of malignant melanoma of skin    04-26-2019 per pt fall 2019 excision from back, localized, and no recurrence  . History of prostate cancer urologist --- dr Alinda Money   dx 2016-- Stage T1c, Gleason 3+3;  05-11-2015 s/p  radical prostatectomy  (04-26-2019 per pt psa undetectable)  . Hx of adenomatous colonic polyps 01/18/2017  . Hyperlipidemia   . Mild obstructive sleep apnea    no longer has to use CPAP wt. loss (pt retested 10-30-2014 epic)  . OA (osteoarthritis)   . Thrombocytopenia, unspecified ([place])    04-26-2019  per pt baseline plt count 150  . Type 2 diabetes mellitus ([place])    followed by pcp  (04-26-2019 check's cbg daily,  fasting cbg-- 110-120;  per pt last A1c 5.8)  . Wears glasses   . Wears glasses     Past Surgical History:  Procedure Laterality Date  . CATARACT EXTRACTION W/ INTRAOCULAR LENS  IMPLANT, BILATERAL  01/2018  . COLONOSCOPY  last one 01-12-2017  . GAS INSERTION Right 08/19/2013    Procedure: INSERTION OF GAS;  Surgeon: Hurman Horn, MD;  Location: [facility];  Service: Ophthalmology;  Laterality: Right;  SF6  . KNEE ARTHROSCOPY Left 05/02/2019   Procedure: ARTHROSCOPY KNEE evaluation under anesthesia debridement partial medial meniscectomy chondroplasty;  Surgeon: Sydnee Cabal, MD;  Location: [facility];  Service: Orthopedics;  Laterality: Left;  Anesthesia - Femoral nerve block/knee block/IV sedation  . MASS EXCISION Left 07/08/2014   Procedure: EXCISION OF KERATOACANTHOMA LEFT LOWER LEG, AND EXCISION OF BASIL CELL CARCINOMA FROM NECK;  Surgeon: Armandina Gemma, MD;  Location: [facility];  Service: General;  Laterality: Left;  left lower leg and posterior neck  . NASAL SEPTOPLASTY W/ TURBINOPLASTY  12-13-2001  dr byers @[facility]   . PROSTATE BIOPSY  11/11/14  . ROBOT ASSISTED LAPAROSCOPIC RADICAL PROSTATECTOMY N/A 05/11/2015   Procedure: ROBOTIC ASSISTED LAPAROSCOPIC RADICAL PROSTATECTOMY LEVEL 1;  Surgeon: Raynelle Bring, MD;  Location: [facility];  Service: Urology;  Laterality: N/A;  . SCLERAL BUCKLE WITH CRYO Right 08/19/2013   Procedure: SCLERAL BUCKLE WITH CRYOPEXY;  Surgeon: Hurman Horn, MD;  Location: [facility];  Service: Ophthalmology;  Laterality: Right;  . TONSILLECTOMY AND ADENOIDECTOMY  chiuld    There were no vitals filed for this visit.  Subjective Assessment -  05/30/19 0803    Subjective  Left medial knee pain 2/10 this AM described as achy. Pt. had been going to gym but stopped due to Covid concerns and had to wait until fitness equipment arrived at home.    Currently in Pain?  Yes    Pain Score  2     Pain Location  Knee    Pain Orientation  Left;Medial    Pain Descriptors / Indicators  Aching    Pain Type  Acute pain    Pain Onset  1 to 4 weeks ago    Pain Frequency  Intermittent                       [facility] Adult PT Treatment/Exercise - 05/30/19 0001      Knee/Hip Exercises: Aerobic   Recumbent Bike  L1 x 6 min       Knee/Hip Exercises: Standing   Forward Lunges  Left;1 set;10 reps    Forward Lunges Limitations  partial lunge    Terminal Knee Extension  AROM;Strengthening;Left;2 sets;10 reps    Theraband Level (Terminal Knee Extension)  Level 3 (Green)    Lateral Step Up  Left;2 sets;10 reps;Hand Hold: 0;Step Height: 4"    Forward Step Up  Left;2 sets;10 reps;Hand Hold: 0;Step Height: 6"    Forward Step Up Limitations  started with 4 in. but too easy    Functional Squat Limitations  partial squat at counter x 10    SLS  L SLS on Airex 10 sec x 5    Other Standing Knee Exercises  standing hip 3-way SLR x 10 ea. bilat. green band      Knee/Hip Exercises: Supine   Bridges  Strengthening;Both;2 sets;10 reps             PT Education - 05/30/19 0847    Education Details  HEP, POC    Person(s) Educated  Patient    Methods  Explanation;Demonstration;Verbal cues;Handout    Comprehension  Verbalized understanding;Returned demonstration          PT Long Term Goals - 05/06/19 1039      PT LONG TERM GOAL #1   Title  Independent with HEP    Baseline  needs HEP    Time  6    Period  Weeks    Status  New    Target Date  06/17/19      PT LONG TERM GOAL #2   Title  Improve FOTO outcome measure score to 27% or less impairment    Baseline  36% limited    Time  6    Period  Weeks    Status  New    Target Date  06/17/19      PT LONG TERM GOAL #3   Title  Left knee strength 5/5 to improve ability to navigate stairs and perform transfers from low seating    Baseline  MMT not tested at eval given recent post-op status    Time  6    Period  Weeks    Status  New    Target Date  06/17/19      PT LONG TERM GOAL #4   Title  Increase left knee flexion 10 deg or greater to improve ability for stair navigation, participation in independent exercise with yoga classes    Baseline  125 deg    Time  6    Period  Weeks    Status  New  Target Date  06/17/19            Plan - 05/30/19 0847     Clinical Impression Statement  Progressed pt. with closed chain exercises today with good tolerance, able to return dmeos well with min cues. Plan have pt. continue with HEP and follow up only if needed otherwise d/c to HEP.    Personal Factors and Comorbidities  Comorbidity 1    Comorbidities  diabetic    Examination-Activity Limitations  Stairs;Locomotion Level;Squat;Lift;Stand    Stability/Clinical Decision Making  Stable/Uncomplicated    Clinical Decision Making  Low    Rehab Potential  Excellent    PT Frequency  --   1-2x/week   PT Duration  6 weeks    PT Treatment/Interventions  ADLs/Self Care Home Management;Cryotherapy;Electrical Stimulation;Therapeutic activities;Therapeutic exercise;Gait training;Functional mobility training;Stair training;Neuromuscular re-education;Manual techniques;Passive range of motion;Vasopneumatic Device    PT Next Visit Plan  review/progress HEP and closed chain strengthening as needed    PT Home Exercise Plan  partial squat, step up, lunge, standing 3-way SLR, SLS, hip bridge, standing TKE    Consulted and Agree with Plan of Care  Patient       Patient will benefit from skilled therapeutic intervention in order to improve the following deficits and impairments:  Difficulty walking, Pain, Impaired flexibility, Decreased strength, Decreased range of motion, Increased edema  Visit Diagnosis: Left knee pain, unspecified chronicity  Stiffness of left knee, not elsewhere classified     Problem List Patient Active Problem List   Diagnosis Date Noted  . Hx of adenomatous colonic polyps 01/18/2017  . Prostate cancer ([place]) 05/11/2015  . Malignant neoplasm of prostate ([place]) 12/18/2014  . Cough 09/10/2014  . Keratoacanthoma of lower leg, 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  . Basal cell carcinoma of neck 04/24/2014  . Haglund's deformity of right heel 01/16/2014  . FOOT PAIN, BILATERAL 04/12/2010  . NEOPLASM OF UNCERTAIN BEHAVIOR OF SKIN 10/28/2009  . HYPOGONADISM  10/28/2009  . OTITIS EXTERNA 10/28/2009  . CERUMEN IMPACTION 10/28/2009  . ACHILLES TENDINITIS 02/11/2009  . PLANTAR FASCIITIS, RIGHT 02/11/2009  . ABNORMAL LABS 08/15/2008  . ABNORMAL LIVER FUNCTION TESTS 08/15/2008  . OBSTRUCTIVE SLEEP APNEA 10/30/2007  . DIABETES MELLITUS, TYPE II 02/03/2007  . DYSLIPIDEMIA 02/03/2007  . DISORDERS, 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  . ASTHMA 02/03/2007  . MICROALBUMINURIA 02/03/2007    Beaulah Dinning, PT, DPT 05/30/19 8:49 AM  [facility] [address] [place], [place], [address] Phone: [phone]   Fax:  [phone]  Name: MOSES MEIER MRN: [id number] Date of Birth: 03-14-1956

## 2019-06-04 MED FILL — ESCITALOPRAM 20 MG TABLET: 20 | 90 days supply | Qty: 90 | Fill #0

## 2019-06-18 MED FILL — TRULICITY 0.75 MG/0.5 ML PE: 0.75 | 84 days supply | Qty: 6 | Fill #0

## 2019-07-03 NOTE — Therapy (Signed)
[place], [place], [address] Phone: [phone]   Fax:  [phone]  Physical Therapy Treatment/Discharge  Patient Details  Name: Timothy [name] MRN: [id number] Date of Birth: 03/03/1956 Referring Provider (PT): Sydnee Cabal, MD   Encounter Date: 05/30/2019    Past Medical History:  Diagnosis Date  . Acute meniscal tear of knee   . Environmental allergies   . GERD (gastroesophageal reflux disease)   . History of basal cell carcinoma (BCC) excision   . History of detached retina repair    left s/p laser 2019;  right s/p surgical repair 02/ 2015  . History of malignant melanoma of skin    04-26-2019 per pt fall 2019 excision from back, localized, and no recurrence  . History of prostate cancer urologist --- dr Alinda Money   dx 2016-- Stage T1c, Gleason 3+3;  05-11-2015 s/p  radical prostatectomy  (04-26-2019 per pt psa undetectable)  . Hx of adenomatous colonic polyps 01/18/2017  . Hyperlipidemia   . Mild obstructive sleep apnea    no longer has to use CPAP wt. loss (pt retested 10-30-2014 epic)  . OA (osteoarthritis)   . Thrombocytopenia, unspecified ([place])    04-26-2019  per pt baseline plt count 150  . Type 2 diabetes mellitus ([place])    followed by pcp  (04-26-2019 check's cbg daily,  fasting cbg-- 110-120;  per pt last A1c 5.8)  . Wears glasses   . Wears glasses     Past Surgical History:  Procedure Laterality Date  . CATARACT EXTRACTION W/ INTRAOCULAR LENS  IMPLANT, BILATERAL  01/2018  . COLONOSCOPY  last one 01-12-2017  . GAS INSERTION Right 08/19/2013   Procedure: INSERTION OF GAS;  Surgeon: Hurman Horn, MD;  Location: [facility];  Service: Ophthalmology;  Laterality: Right;  SF6  . KNEE ARTHROSCOPY Left 05/02/2019   Procedure: ARTHROSCOPY KNEE evaluation under anesthesia debridement partial medial meniscectomy chondroplasty;  Surgeon: Sydnee Cabal, MD;  Location: [facility];   Service: Orthopedics;  Laterality: Left;  Anesthesia - Femoral nerve block/knee block/IV sedation  . MASS EXCISION Left 07/08/2014   Procedure: EXCISION OF KERATOACANTHOMA LEFT LOWER LEG, AND EXCISION OF BASIL CELL CARCINOMA FROM NECK;  Surgeon: Armandina Gemma, MD;  Location: [facility];  Service: General;  Laterality: Left;  left lower leg and posterior neck  . NASAL SEPTOPLASTY W/ TURBINOPLASTY  12-13-2001  dr byers @[facility]   . PROSTATE BIOPSY  11/11/14  . ROBOT ASSISTED LAPAROSCOPIC RADICAL PROSTATECTOMY N/A 05/11/2015   Procedure: ROBOTIC ASSISTED LAPAROSCOPIC RADICAL PROSTATECTOMY LEVEL 1;  Surgeon: Raynelle Bring, MD;  [facility];  Service: Urology;  Laterality: N/A;  . SCLERAL BUCKLE WITH CRYO Right 08/19/2013   Procedure: SCLERAL BUCKLE WITH CRYOPEXY;  Surgeon: Hurman Horn, MD;  Location: [facility];  Service: Ophthalmology;  Laterality: Right;  . TONSILLECTOMY AND ADENOIDECTOMY  chiuld    There were no vitals filed for this visit.                                 PT Long Term Goals - 05/06/19 1039      PT LONG TERM GOAL #1   Title  Independent with HEP    Baseline  needs HEP    Time  6    Period  Weeks    Status  New    Target Date  06/17/19      PT  LONG TERM GOAL #2   Title  Improve FOTO outcome measure score to 27% or less impairment    Baseline  36% limited    Time  6    Period  Weeks    Status  New    Target Date  06/17/19      PT LONG TERM GOAL #3   Title  Left knee strength 5/5 to improve ability to navigate stairs and perform transfers from low seating    Baseline  MMT not tested at eval given recent post-op status    Time  6    Period  Weeks    Status  New    Target Date  06/17/19      PT LONG TERM GOAL #4   Title  Increase left knee flexion 10 deg or greater to improve ability for stair navigation, participation in independent exercise with yoga classes    Baseline  125 deg    Time  6    Period  Weeks    Status  New     Target Date  06/17/19              Patient will benefit from skilled therapeutic intervention in order to improve the following deficits and impairments:  Difficulty walking, Pain, Impaired flexibility, Decreased strength, Decreased range of motion, Increased edema  Visit Diagnosis: Left knee pain, unspecified chronicity  Stiffness of left knee, not elsewhere classified     Problem List Patient Active Problem List   Diagnosis Date Noted  . Hx of adenomatous colonic polyps 01/18/2017  . Prostate cancer ([place]) 05/11/2015  . Malignant neoplasm of prostate ([place]) 12/18/2014  . Cough 09/10/2014  . Keratoacanthoma of lower leg, 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  . Basal cell carcinoma of neck 04/24/2014  . Haglund's deformity of right heel 01/16/2014  . FOOT PAIN, BILATERAL 04/12/2010  . NEOPLASM OF UNCERTAIN BEHAVIOR OF SKIN 10/28/2009  . HYPOGONADISM 10/28/2009  . OTITIS EXTERNA 10/28/2009  . CERUMEN IMPACTION 10/28/2009  . ACHILLES TENDINITIS 02/11/2009  . PLANTAR FASCIITIS, RIGHT 02/11/2009  . ABNORMAL LABS 08/15/2008  . ABNORMAL LIVER FUNCTION TESTS 08/15/2008  . OBSTRUCTIVE SLEEP APNEA 10/30/2007  . DIABETES MELLITUS, TYPE II 02/03/2007  . DYSLIPIDEMIA 02/03/2007  . DISORDERS, 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  . ASTHMA 02/03/2007  . MICROALBUMINURIA 02/03/2007      PHYSICAL THERAPY DISCHARGE SUMMARY  Visits from Start of Care: 2  Current functional level related to goals / functional outcomes: Patient is continuing with HEP for further progress, no further formal therapy planned at this time.    Remaining deficits: NA   Education / Equipment: HEP, POC Plan: Patient agrees to discharge.  Patient goals were partially met.                                                                                                      ?????          Beaulah Dinning, PT, DPT 07/03/19 2:40 PM     [facility] [address] [place], [place], [address] Phone: [phone]  Fax:  [phone]  Name: Timothy [name] MRN: [id number] Date of Birth: [birth year]

## 2019-07-12 DIAGNOSIS — E7800 Pure hypercholesterolemia, unspecified: Secondary | ICD-10-CM | POA: Diagnosis not present

## 2019-07-12 DIAGNOSIS — H00012 Hordeolum externum right lower eyelid: Secondary | ICD-10-CM | POA: Diagnosis not present

## 2019-07-12 DIAGNOSIS — F419 Anxiety disorder, unspecified: Secondary | ICD-10-CM | POA: Diagnosis not present

## 2019-07-12 DIAGNOSIS — E119 Type 2 diabetes mellitus without complications: Secondary | ICD-10-CM | POA: Diagnosis not present

## 2019-07-15 DIAGNOSIS — E119 Type 2 diabetes mellitus without complications: Secondary | ICD-10-CM | POA: Diagnosis not present

## 2019-07-15 MED FILL — metFORMIN HCL 500 MG TABS: 500 | 90 days supply | Qty: 180 | Fill #0

## 2019-07-15 MED FILL — ATORVASTATIN 10 MG TABLET: 10 | 90 days supply | Qty: 90 | Fill #0

## 2019-07-29 DIAGNOSIS — Z8546 Personal history of malignant neoplasm of prostate: Secondary | ICD-10-CM | POA: Diagnosis not present

## 2019-08-09 DIAGNOSIS — M1712 Unilateral primary osteoarthritis, left knee: Secondary | ICD-10-CM | POA: Diagnosis not present

## 2019-08-29 DIAGNOSIS — Z85820 Personal history of malignant melanoma of skin: Secondary | ICD-10-CM | POA: Diagnosis not present

## 2019-08-29 DIAGNOSIS — L570 Actinic keratosis: Secondary | ICD-10-CM | POA: Diagnosis not present

## 2019-08-29 DIAGNOSIS — L812 Freckles: Secondary | ICD-10-CM | POA: Diagnosis not present

## 2019-08-29 DIAGNOSIS — D225 Melanocytic nevi of trunk: Secondary | ICD-10-CM | POA: Diagnosis not present

## 2019-08-29 DIAGNOSIS — Z85828 Personal history of other malignant neoplasm of skin: Secondary | ICD-10-CM | POA: Diagnosis not present

## 2019-08-29 DIAGNOSIS — L821 Other seborrheic keratosis: Secondary | ICD-10-CM | POA: Diagnosis not present

## 2019-08-29 DIAGNOSIS — L309 Dermatitis, unspecified: Secondary | ICD-10-CM | POA: Diagnosis not present

## 2019-09-03 MED FILL — TRULICITY 0.75 MG/0.5 ML PE: 0.75 | 84 days supply | Qty: 6 | Fill #1

## 2019-09-04 MED FILL — ESCITALOPRAM 20 MG TABLET: 20 | 90 days supply | Qty: 90 | Fill #1

## 2019-09-11 DIAGNOSIS — M1712 Unilateral primary osteoarthritis, left knee: Secondary | ICD-10-CM | POA: Diagnosis not present

## 2019-09-18 DIAGNOSIS — M1712 Unilateral primary osteoarthritis, left knee: Secondary | ICD-10-CM | POA: Diagnosis not present

## 2019-09-25 DIAGNOSIS — M1712 Unilateral primary osteoarthritis, left knee: Secondary | ICD-10-CM | POA: Diagnosis not present

## 2019-09-30 MED FILL — metFORMIN HCL 500 MG TABS: 500 | 90 days supply | Qty: 180 | Fill #1

## 2019-09-30 MED FILL — ATORVASTATIN 10 MG TABLET: 10 | 90 days supply | Qty: 90 | Fill #1

## 2019-11-01 ENCOUNTER — Telehealth: Payer: Self-pay | Admitting: Pulmonary Disease

## 2019-11-01 DIAGNOSIS — G4733 Obstructive sleep apnea (adult) (pediatric): Secondary | ICD-10-CM

## 2019-11-01 NOTE — Telephone Encounter (Signed)
C/o more sleep apnea type symptoms - nonrefreshing sleep, snoring Schedule home sleep test please to reassess

## 2019-11-07 NOTE — Telephone Encounter (Signed)
ATC pt, no answer. Left message for pt to call back.  

## 2019-11-08 ENCOUNTER — Ambulatory Visit: Payer: 59

## 2019-11-08 ENCOUNTER — Other Ambulatory Visit (HOSPITAL_COMMUNITY): Payer: Self-pay | Admitting: Internal Medicine

## 2019-11-08 ENCOUNTER — Telehealth: Payer: Self-pay | Admitting: Pulmonary Disease

## 2019-11-08 ENCOUNTER — Other Ambulatory Visit: Payer: Self-pay

## 2019-11-08 DIAGNOSIS — G4733 Obstructive sleep apnea (adult) (pediatric): Secondary | ICD-10-CM

## 2019-11-08 DIAGNOSIS — J301 Allergic rhinitis due to pollen: Secondary | ICD-10-CM | POA: Diagnosis not present

## 2019-11-08 NOTE — Telephone Encounter (Signed)
Called and spoke with Dr. Joya Gaskins and stated to him that we are going to place order to have sleep study performed and he verbalized understanding. Order placed. Nothing further needed.

## 2019-11-08 NOTE — Telephone Encounter (Signed)
Please refer to encounter dated 5/7. Closing this encounter.

## 2019-11-21 ENCOUNTER — Other Ambulatory Visit (HOSPITAL_COMMUNITY): Payer: Self-pay | Admitting: Internal Medicine

## 2019-11-21 MED FILL — TRULICITY 0.75 MG/0.5 ML PE: 0.75 | 84 days supply | Qty: 6 | Fill #0

## 2019-11-26 DIAGNOSIS — R682 Dry mouth, unspecified: Secondary | ICD-10-CM | POA: Insufficient documentation

## 2019-11-26 DIAGNOSIS — G4733 Obstructive sleep apnea (adult) (pediatric): Secondary | ICD-10-CM | POA: Diagnosis not present

## 2019-11-26 DIAGNOSIS — J3489 Other specified disorders of nose and nasal sinuses: Secondary | ICD-10-CM | POA: Insufficient documentation

## 2019-11-26 DIAGNOSIS — H6121 Impacted cerumen, right ear: Secondary | ICD-10-CM | POA: Diagnosis not present

## 2019-11-26 DIAGNOSIS — H905 Unspecified sensorineural hearing loss: Secondary | ICD-10-CM | POA: Insufficient documentation

## 2019-11-26 DIAGNOSIS — H938X1 Other specified disorders of right ear: Secondary | ICD-10-CM | POA: Diagnosis not present

## 2019-11-29 MED FILL — ESCITALOPRAM 20 MG TABLET: 20 | 90 days supply | Qty: 90 | Fill #2

## 2019-12-13 DIAGNOSIS — G4733 Obstructive sleep apnea (adult) (pediatric): Secondary | ICD-10-CM | POA: Diagnosis not present

## 2019-12-13 DIAGNOSIS — H9042 Sensorineural hearing loss, unilateral, left ear, with unrestricted hearing on the contralateral side: Secondary | ICD-10-CM | POA: Diagnosis not present

## 2019-12-26 MED FILL — ATORVASTATIN CALCIUM 10 MG: 10 | 90 days supply | Qty: 90 | Fill #2

## 2019-12-26 MED FILL — METFORMIN HCL 500 MG TABS: 500 | 90 days supply | Qty: 180 | Fill #2

## 2020-01-02 DIAGNOSIS — H906 Mixed conductive and sensorineural hearing loss, bilateral: Secondary | ICD-10-CM | POA: Diagnosis not present

## 2020-01-17 DIAGNOSIS — G4733 Obstructive sleep apnea (adult) (pediatric): Secondary | ICD-10-CM | POA: Diagnosis not present

## 2020-01-17 DIAGNOSIS — Z0000 Encounter for general adult medical examination without abnormal findings: Secondary | ICD-10-CM | POA: Diagnosis not present

## 2020-01-17 DIAGNOSIS — H903 Sensorineural hearing loss, bilateral: Secondary | ICD-10-CM | POA: Diagnosis not present

## 2020-01-17 DIAGNOSIS — Z1159 Encounter for screening for other viral diseases: Secondary | ICD-10-CM | POA: Diagnosis not present

## 2020-01-17 DIAGNOSIS — F419 Anxiety disorder, unspecified: Secondary | ICD-10-CM | POA: Diagnosis not present

## 2020-01-17 DIAGNOSIS — E7800 Pure hypercholesterolemia, unspecified: Secondary | ICD-10-CM | POA: Diagnosis not present

## 2020-01-17 DIAGNOSIS — Z79899 Other long term (current) drug therapy: Secondary | ICD-10-CM | POA: Diagnosis not present

## 2020-01-17 DIAGNOSIS — E119 Type 2 diabetes mellitus without complications: Secondary | ICD-10-CM | POA: Diagnosis not present

## 2020-01-17 DIAGNOSIS — F09 Unspecified mental disorder due to known physiological condition: Secondary | ICD-10-CM | POA: Diagnosis not present

## 2020-01-17 MED FILL — ESCITALOPRAM 10 MG TABLET: 10 | 28 days supply | Qty: 21 | Fill #0

## 2020-01-22 MED FILL — AZELASTINE 0.15% NASAL SPRY: 0.15 | 25 days supply | Qty: 30 | Fill #2

## 2020-02-06 DIAGNOSIS — Z8546 Personal history of malignant neoplasm of prostate: Secondary | ICD-10-CM | POA: Diagnosis not present

## 2020-02-10 DIAGNOSIS — H903 Sensorineural hearing loss, bilateral: Secondary | ICD-10-CM | POA: Diagnosis not present

## 2020-02-11 ENCOUNTER — Other Ambulatory Visit (HOSPITAL_COMMUNITY): Payer: Self-pay | Admitting: Neurology

## 2020-02-11 DIAGNOSIS — R41844 Frontal lobe and executive function deficit: Secondary | ICD-10-CM | POA: Diagnosis not present

## 2020-02-11 DIAGNOSIS — F419 Anxiety disorder, unspecified: Secondary | ICD-10-CM | POA: Diagnosis not present

## 2020-02-11 MED FILL — BUPROPION HCL SR 150 MG TAB: 150 | 30 days supply | Qty: 60 | Fill #0

## 2020-02-11 MED FILL — TRULICITY 0.75 MG/0.5 ML PE: 0.75 | 84 days supply | Qty: 6 | Fill #1

## 2020-02-12 ENCOUNTER — Encounter: Payer: Self-pay | Admitting: Critical Care Medicine

## 2020-02-20 DIAGNOSIS — G319 Degenerative disease of nervous system, unspecified: Secondary | ICD-10-CM | POA: Diagnosis not present

## 2020-03-09 ENCOUNTER — Other Ambulatory Visit: Payer: Self-pay

## 2020-03-09 ENCOUNTER — Other Ambulatory Visit: Payer: Self-pay | Admitting: Internal Medicine

## 2020-03-09 ENCOUNTER — Ambulatory Visit
Admission: RE | Admit: 2020-03-09 | Discharge: 2020-03-09 | Disposition: A | Discharge status: Home / Self Care | Payer: 59 | Source: Ambulatory Visit | Attending: Internal Medicine | Admitting: Internal Medicine

## 2020-03-09 DIAGNOSIS — R05 Cough: Secondary | ICD-10-CM | POA: Diagnosis not present

## 2020-03-09 DIAGNOSIS — H52203 Unspecified astigmatism, bilateral: Secondary | ICD-10-CM | POA: Diagnosis not present

## 2020-03-09 DIAGNOSIS — R059 Cough, unspecified: Secondary | ICD-10-CM

## 2020-03-12 DIAGNOSIS — Z85820 Personal history of malignant melanoma of skin: Secondary | ICD-10-CM | POA: Diagnosis not present

## 2020-03-12 DIAGNOSIS — L821 Other seborrheic keratosis: Secondary | ICD-10-CM | POA: Diagnosis not present

## 2020-03-12 DIAGNOSIS — Z85828 Personal history of other malignant neoplasm of skin: Secondary | ICD-10-CM | POA: Diagnosis not present

## 2020-03-12 DIAGNOSIS — L309 Dermatitis, unspecified: Secondary | ICD-10-CM | POA: Diagnosis not present

## 2020-03-12 DIAGNOSIS — L570 Actinic keratosis: Secondary | ICD-10-CM | POA: Diagnosis not present

## 2020-03-12 DIAGNOSIS — L820 Inflamed seborrheic keratosis: Secondary | ICD-10-CM | POA: Diagnosis not present

## 2020-03-12 DIAGNOSIS — L812 Freckles: Secondary | ICD-10-CM | POA: Diagnosis not present

## 2020-03-12 DIAGNOSIS — D225 Melanocytic nevi of trunk: Secondary | ICD-10-CM | POA: Diagnosis not present

## 2020-03-15 IMAGING — MR MR KNEE*L* W/O CM
4 of 7 series · 20 of 40 positions shown · non-contrast
Comparison: None.

CLINICAL DATA: Left knee pain of unknown duration. Left knee
instability. No known injury.

EXAM:
MRI OF THE LEFT KNEE WITHOUT CONTRAST
TECHNIQUE: Multiplanar, multisequence MR imaging of the knee was performed. No
intravenous contrast was administered.

[Series 4: PD fat-sat · axial · 3.0mm · 0.31mm/px · z∈[-172,-14]mm · 7 of 46 slices shown (1 of 4)]
[im 1/46]
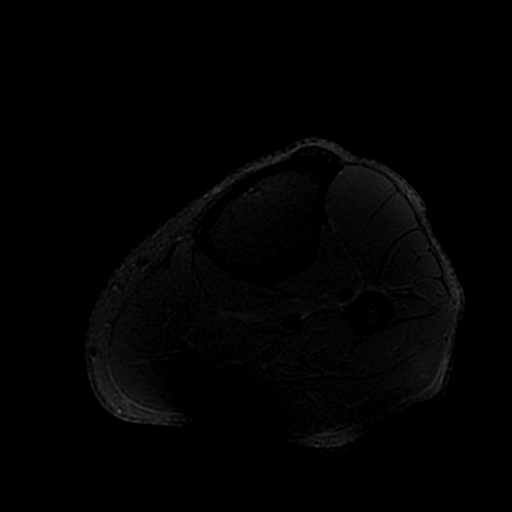
[im 8/46]
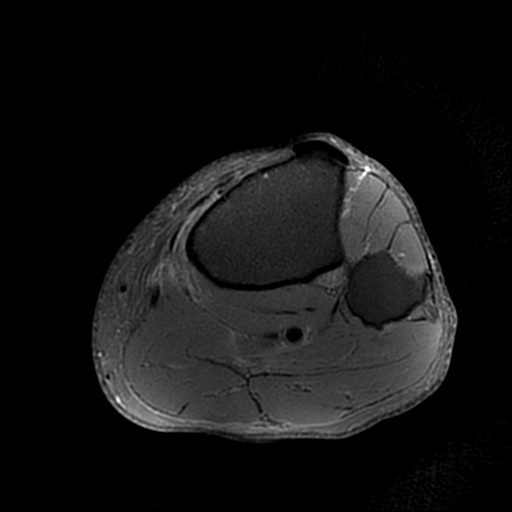
[im 16/46]
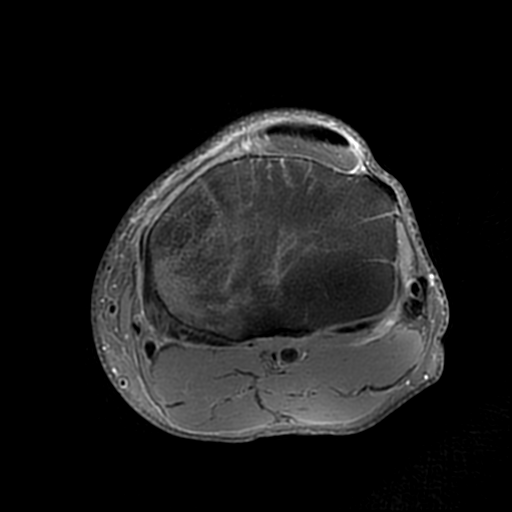
[im 23/46]
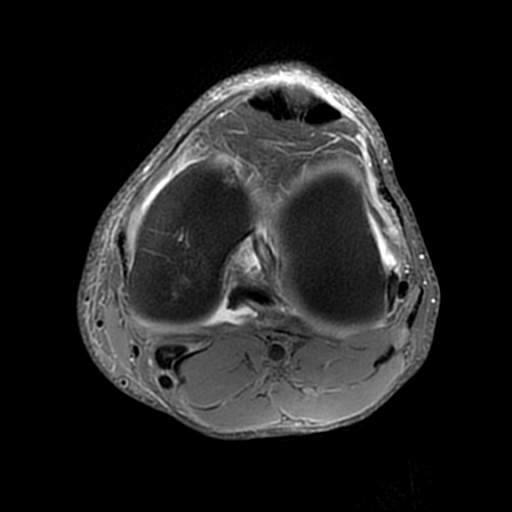
[im 31/46]
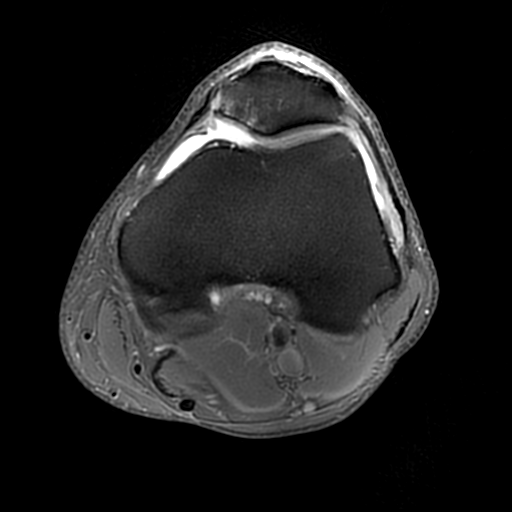
[im 38/46]
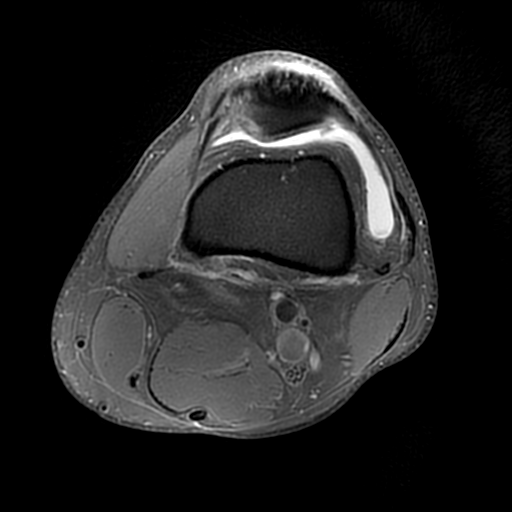
[im 46/46]
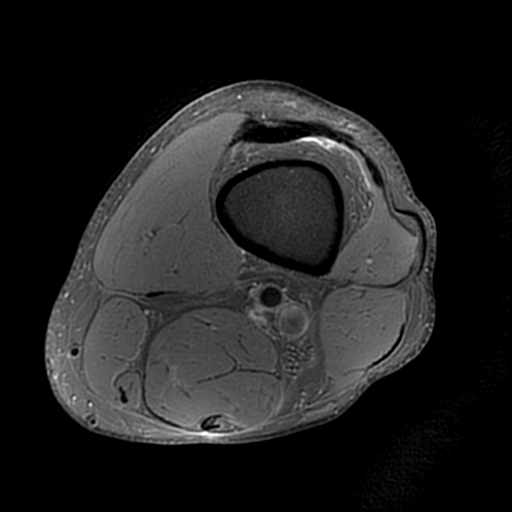

[Series 7: PD fat-sat · sagittal · 3.0mm · 0.31mm/px · 6 of 34 slices shown (2 of 4)]
[im 1/34]
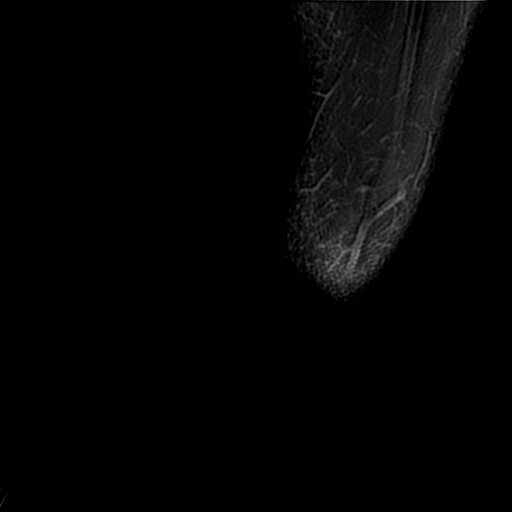
[im 7/34]
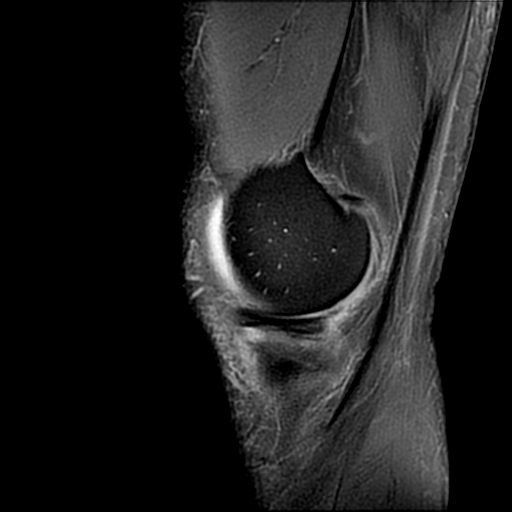
[im 14/34]
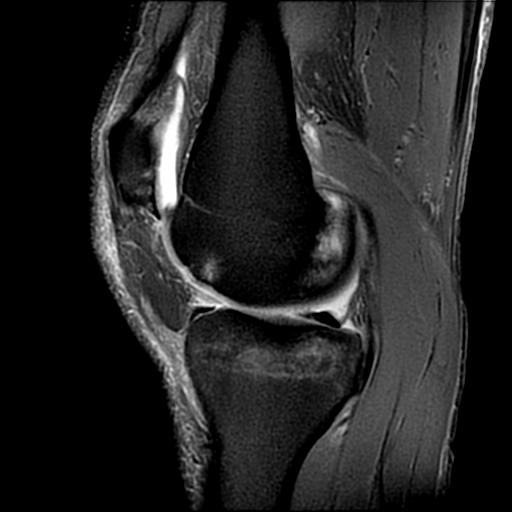
[im 20/34]
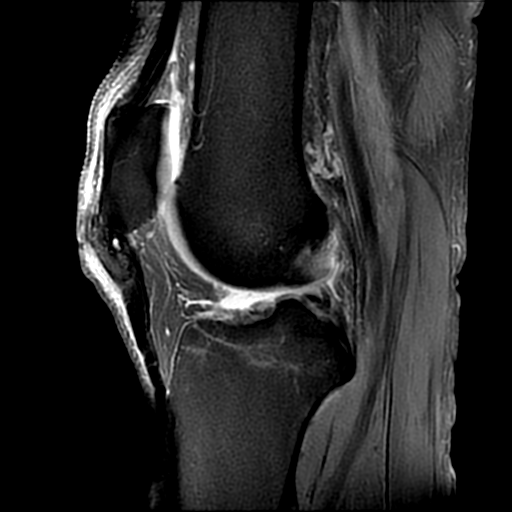
[im 27/34]
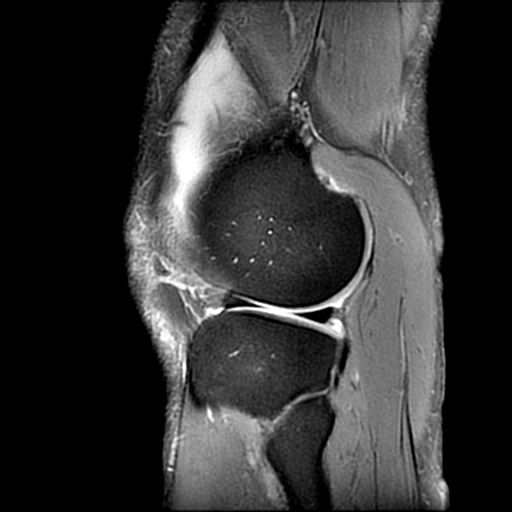
[im 34/34]
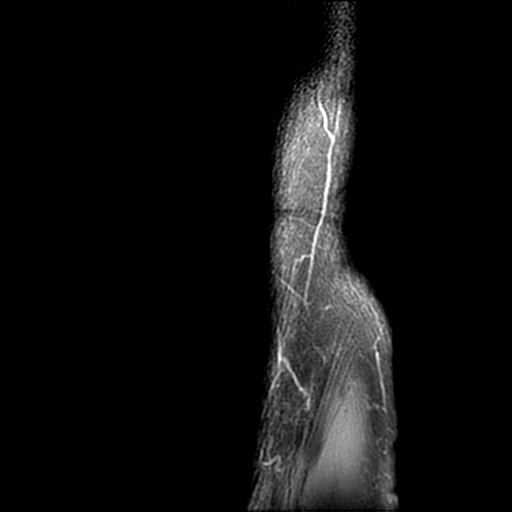

[Series 8: PD fat-sat · coronal · 3.0mm · 0.35mm/px · 5 of 33 slices shown (3 of 4)]
[im 1/33]
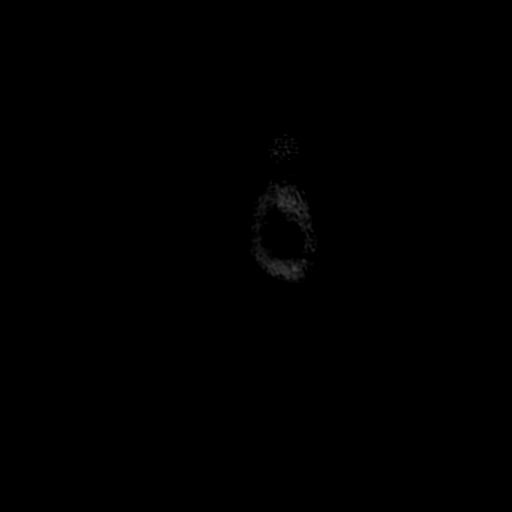
[im 7/33]
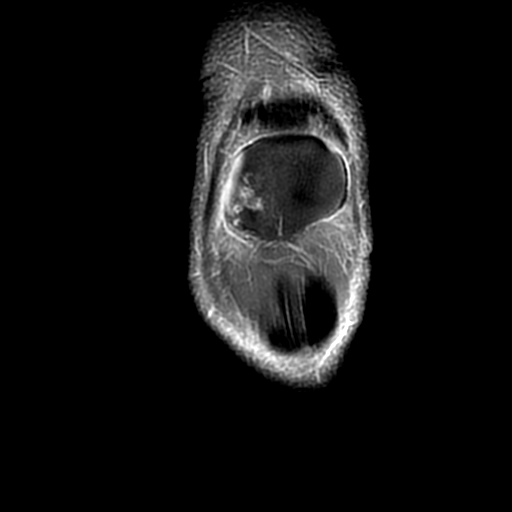
[im 13/33]
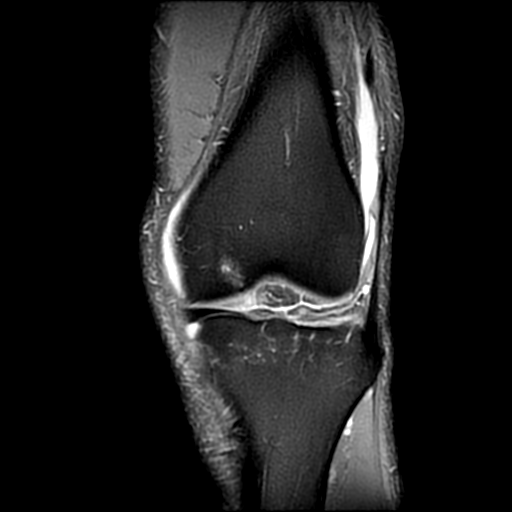
[im 20/33]
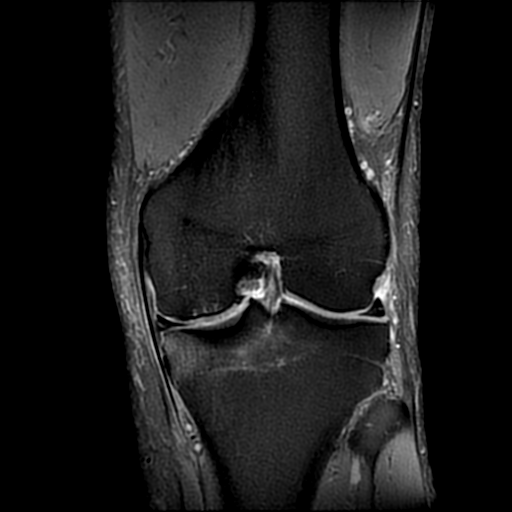
[im 33/33]
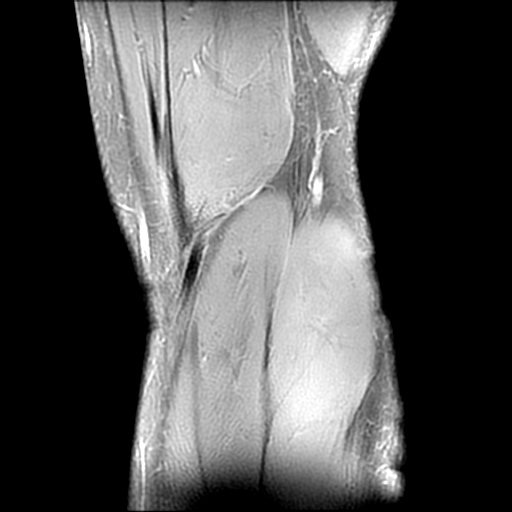

[Series 9: PD fat-sat · oblique · 2.0mm · 0.70mm/px · 2 of 13 slices shown (4 of 4)]
[im 1/13]
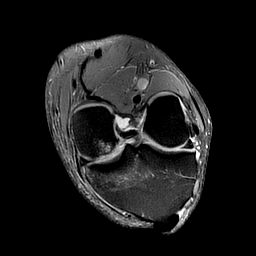
[im 13/13]
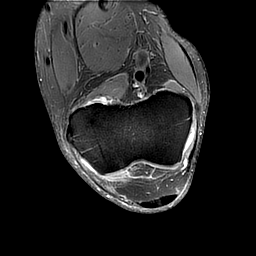

[20 of 40 positions shown; findings below may reference images not displayed]

FINDINGS: MENISCI

Medial meniscus: There is a tear in the posterior horn with a radial
component along the free edge and a horizontal component reaching
the meniscal undersurface. No displaced fragment.

Lateral meniscus:  Intact.

LIGAMENTS

Cruciates:  Intact.

Collaterals:  Intact.

CARTILAGE

Patellofemoral: Cartilage at the apex of the patella and along the
medial facet is largely denuded.

Medial:  Markedly thinned and irregular throughout.

Lateral:  Preserved.

Joint:  Small effusion.

Popliteal Fossa:  No Baker's cyst.

Extensor Mechanism: Intact. Ossific fragment in the superior fibers
of the patellar tendon is likely due to remote tendinosis or trauma.

Bones: No fracture or worrisome lesion. There is some subchondral
edema about the medial compartment. Small osteophytes are identified
about the knee.

Other: None.
IMPRESSION: Tear of the posterior horn of the medial meniscus includes a radial
component along the free edge and a horizontal component reaching
the meniscal undersurface.

Moderate to moderately severe patellofemoral and medial compartment
osteoarthritis.

## 2020-03-16 MED FILL — BUPROPION HCL SR 150 MG TAB: 150 | 30 days supply | Qty: 60 | Fill #1

## 2020-03-23 MED FILL — METFORMIN HCL 500 MG TABS: 500 | 90 days supply | Qty: 180 | Fill #3

## 2020-03-23 MED FILL — AZELASTINE 0.15% NASAL SPRY: 0.15 | 25 days supply | Qty: 30 | Fill #3

## 2020-03-23 MED FILL — ATORVASTATIN CALCIUM 10 MG: 10 | 90 days supply | Qty: 90 | Fill #3

## 2020-03-26 DIAGNOSIS — F419 Anxiety disorder, unspecified: Secondary | ICD-10-CM | POA: Diagnosis not present

## 2020-03-26 DIAGNOSIS — F09 Unspecified mental disorder due to known physiological condition: Secondary | ICD-10-CM | POA: Diagnosis not present

## 2020-03-26 DIAGNOSIS — F422 Mixed obsessional thoughts and acts: Secondary | ICD-10-CM | POA: Diagnosis not present

## 2020-03-31 ENCOUNTER — Other Ambulatory Visit: Payer: Self-pay | Admitting: Gastroenterology

## 2020-03-31 ENCOUNTER — Ambulatory Visit (AMBULATORY_SURGERY_CENTER): Payer: 59 | Admitting: *Deleted

## 2020-03-31 ENCOUNTER — Encounter: Payer: Self-pay | Admitting: Gastroenterology

## 2020-03-31 DIAGNOSIS — Z86010 Personal history of colonic polyps: Secondary | ICD-10-CM

## 2020-03-31 MED ORDER — NA SULFATE-K SULFATE-MG SULF 17.5-3.13-1.6 GM/177ML PO SOLN: 1.0000 | Freq: Once | ORAL | 0 refills | Status: DC

## 2020-03-31 MED FILL — SUPREP BOWEL PREP KIT: 17.5-3.13-1 | 1 days supply | Qty: 354 | Fill #0

## 2020-03-31 NOTE — Progress Notes (Addendum)
No egg or soy allergy known to patient  No issues with past sedation with any surgeries or procedures no intubation problems in the past  No FH of Malignant Hyperthermia No diet pills per patient No home 02 use per patient  No blood thinners per patient  Pt denies issues with constipation  No A fib or A flutter  EMMI video to pt or via [place] 19 guidelines implemented in PV today with Pt and RN    Virtual visit completed. No coupon necessary.  Due to the COVID-19 pandemic we are asking patients to follow these guidelines. Please only bring one care partner. Please be aware that your care partner may wait in the car in the parking lot or if they feel like they will be too hot to wait in the car, they may wait in the lobby on the 4th floor. All care partners are required to wear a mask the entire time (we do not have any that we can provide them), they need to practice social distancing, and we will do a Covid check for all patient's and care partners when you arrive. Also we will check their temperature and your temperature. If the care partner waits in their car they need to stay in the parking lot the entire time and we will call them on their cell phone when the patient is ready for discharge so they can bring the car to the front of the building. Also all patient's will need to wear a mask into building.

## 2020-04-07 ENCOUNTER — Encounter: Payer: 59 | Admitting: Internal Medicine

## 2020-04-07 ENCOUNTER — Ambulatory Visit (AMBULATORY_SURGERY_CENTER): Payer: 59 | Admitting: Gastroenterology

## 2020-04-07 ENCOUNTER — Other Ambulatory Visit: Payer: Self-pay

## 2020-04-07 ENCOUNTER — Encounter: Payer: Self-pay | Admitting: Gastroenterology

## 2020-04-07 VITALS — BP 113/63 | HR 66 | Temp 97.1°F | Resp 12 | Ht 72.0 in | Wt 185.0 lb

## 2020-04-07 DIAGNOSIS — D123 Benign neoplasm of transverse colon: Secondary | ICD-10-CM | POA: Diagnosis not present

## 2020-04-07 DIAGNOSIS — G4733 Obstructive sleep apnea (adult) (pediatric): Secondary | ICD-10-CM | POA: Diagnosis not present

## 2020-04-07 DIAGNOSIS — D125 Benign neoplasm of sigmoid colon: Secondary | ICD-10-CM

## 2020-04-07 DIAGNOSIS — I10 Essential (primary) hypertension: Secondary | ICD-10-CM | POA: Diagnosis not present

## 2020-04-07 DIAGNOSIS — Z86010 Personal history of colonic polyps: Secondary | ICD-10-CM | POA: Diagnosis not present

## 2020-04-07 DIAGNOSIS — Z1211 Encounter for screening for malignant neoplasm of colon: Secondary | ICD-10-CM | POA: Diagnosis not present

## 2020-04-07 DIAGNOSIS — D124 Benign neoplasm of descending colon: Secondary | ICD-10-CM | POA: Diagnosis not present

## 2020-04-07 DIAGNOSIS — K219 Gastro-esophageal reflux disease without esophagitis: Secondary | ICD-10-CM | POA: Diagnosis not present

## 2020-04-07 MED ORDER — SODIUM CHLORIDE 0.9 % IV SOLN: 500.0000 mL | Freq: Once | INTRAVENOUS | Status: AC

## 2020-04-07 NOTE — Patient Instructions (Signed)
YOU HAD AN ENDOSCOPIC PROCEDURE TODAY AT [facility]:   Refer to the procedure report that was given to you for any specific questions about what was found during the examination.  If the procedure report does not answer your questions, please call your gastroenterologist to clarify.  If you requested that your care partner not be given the details of your procedure findings, then the procedure report has been included in a sealed envelope for you to review at your convenience later.  YOU SHOULD EXPECT: Some feelings of bloating in the abdomen. Passage of more gas than usual.  Walking can help get rid of the air that was put into your GI tract during the procedure and reduce the bloating. If you had a lower endoscopy (such as a colonoscopy or flexible sigmoidoscopy) you may notice spotting of blood in your stool or on the toilet paper. If you underwent a bowel prep for your procedure, you may not have a normal bowel movement for a few days.  Please Note:  You might notice some irritation and congestion in your nose or some drainage.  This is from the oxygen used during your procedure.  There is no need for concern and it should clear up in a day or so.  SYMPTOMS TO REPORT IMMEDIATELY:   Following lower endoscopy (colonoscopy or flexible sigmoidoscopy):  Excessive amounts of blood in the stool  Significant tenderness or worsening of abdominal pains  Swelling of the abdomen that is new, acute  Fever of 100F or higher   For urgent or emergent issues, a gastroenterologist can be reached at any hour by calling [phone]. Do not use MyChart messaging for urgent concerns.    DIET:  We do recommend a small meal at first, but then you may proceed to your regular diet.  Drink plenty of fluids but you should avoid alcoholic beverages for 24 hours. Please follow a High Fiber Diet.  MEDICATIONS: Continue present medications.  Please see handouts given to you by your recovery  nurse.  ACTIVITY:  You should plan to take it easy for the rest of today and you should NOT DRIVE or use heavy machinery until tomorrow (because of the sedation medicines used during the test).    FOLLOW UP: Our staff will call the number listed on your records 48-72 hours following your procedure to check on you and address any questions or concerns that you may have regarding the information given to you following your procedure. If we do not reach you, we will leave a message.  We will attempt to reach you two times.  During this call, we will ask if you have developed any symptoms of COVID 19. If you develop any symptoms (ie: fever, flu-like symptoms, shortness of breath, cough etc.) before then, please call [phone].  If you test positive for Covid 19 in the 2 weeks post procedure, please call and report this information to [place].    If any biopsies were taken you will be contacted by phone or by letter within the next 1-3 weeks.  Please call us at [phone] if you have not heard about the biopsies in 3 weeks.   Thank you for allowing [place] to provide for your healthcare needs today.   SIGNATURES/CONFIDENTIALITY: You and/or your care partner have signed paperwork which will be entered into your electronic medical record.  These signatures attest to the fact that that the information above on your After Visit Summary has been reviewed and is  understood.  Full responsibility of the confidentiality of this discharge information lies with you and/or your care-partner. 

## 2020-04-07 NOTE — Progress Notes (Signed)
Pt's states no medical or surgical changes since previsit or office visit. 

## 2020-04-07 NOTE — Progress Notes (Signed)
Report to PACU, RN, vss, BBS= Clear.  

## 2020-04-07 NOTE — Progress Notes (Signed)
Called to room to assist during endoscopic procedure.  Patient ID and intended procedure confirmed with present staff. Received instructions for my participation in the procedure from the performing physician.  

## 2020-04-07 NOTE — Op Note (Signed)
[facility] Patient Name: Braylyn Kalter Procedure Date: 04/07/2020 3:25 PM MRN: [id number] Endoscopist: Ladene Artist , MD Age: 64 Referring MD:  Date of Birth: 25-Feb-1956 Gender: Male Account #: [id number] Procedure:                Colonoscopy Indications:              Surveillance: Personal history of adenomatous                            polyps on last colonoscopy 3 years ago Medicines:                Monitored Anesthesia Care Procedure:                Pre-Anesthesia Assessment:                           - Prior to the procedure, a History and Physical                            was performed, and patient medications and                            allergies were reviewed. The patient's tolerance of                            previous anesthesia was also reviewed. The risks                            and benefits of the procedure and the sedation                            options and risks were discussed with the patient.                            All questions were answered, and informed consent                            was obtained. Prior Anticoagulants: The patient has                            taken no previous anticoagulant or antiplatelet                            agents. ASA Grade Assessment: II - A patient with                            mild systemic disease. After reviewing the risks                            and benefits, the patient was deemed in                            satisfactory condition to undergo the procedure.  After obtaining informed consent, the colonoscope                            was passed under direct vision. Throughout the                            procedure, the patient's blood pressure, pulse, and                            oxygen saturations were monitored continuously. The                            Colonoscope was introduced through the anus and                            advanced to the the cecum,  identified by                            appendiceal orifice and ileocecal valve. The                            ileocecal valve, appendiceal orifice, and rectum                            were photographed. The quality of the bowel                            preparation was good. The colonoscopy was somewhat                            difficult due to significant looping. Successful                            completion of the procedure was aided by applying                            abdominal pressure. The patient tolerated the                            procedure well. Scope In: 3:30:05 PM Scope Out: 4:08:10 PM Scope Withdrawal Time: 0 hours 27 minutes 26 seconds  Total Procedure Duration: 0 hours 38 minutes 5 seconds  Findings:                 The perianal and digital rectal examinations were                            normal.                           Five sessile polyps were found in the transverse                            colon. The polyps were 4 to 6 mm in size. These  polyps were removed with a cold snare. Resection                            and retrieval were complete.                           A 3 mm polyp was found in the descending colon. The                            polyp was sessile. The polyp was removed with a                            cold biopsy forceps. Resection and retrieval were                            complete.                           Two sessile polyps were found in the sigmoid colon.                            The polyps were 6 mm in size. These polyps were                            removed with a cold snare. Resection and retrieval                            were complete.                           Multiple medium-mouthed diverticula were found in                            the left colon. There was no evidence of                            diverticular bleeding.                           Anal papilla(e) were  hypertrophied.                           The exam was otherwise without abnormality on                            direct and retroflexion views. Complications:            No immediate complications. Estimated blood loss:                            None. Estimated Blood Loss:     Estimated blood loss: none. Impression:               - Five 4 to 6 mm polyps in the transverse colon,  removed with a cold snare. Resected and retrieved.                           - One 3 mm polyp in the descending colon, removed                            with a cold biopsy forceps. Resected and retrieved.                           - Two 6 mm polyps in the sigmoid colon, removed                            with a cold snare. Resected and retrieved.                           - Mild diverticulosis in the left colon.                           - Anal papilla(e) were hypertrophied.                           - The examination was otherwise normal on direct                            and retroflexion views. Recommendation:           - Repeat colonoscopy after studies are complete for                            surveillance based on pathology results with Dr.                            Carlean Purl.                           - Patient has a contact number available for                            emergencies. The signs and symptoms of potential                            delayed complications were discussed with the                            patient. Return to normal activities tomorrow.                            Written discharge instructions were provided to the                            patient.                           - High fiber diet.                           -  Continue present medications.                           - Await pathology results. Ladene Artist, MD 04/07/2020 4:14:32 PM This report has been signed electronically.

## 2020-04-07 NOTE — Progress Notes (Signed)
C.W. vital signs. 

## 2020-04-09 ENCOUNTER — Telehealth: Payer: Self-pay

## 2020-04-09 NOTE — Telephone Encounter (Signed)
  Follow up Call-  Call back number 04/07/2020  Post procedure Call Back phone  # [phone]  Permission to leave phone message Yes  Some recent data might be hidden     Patient questions:  Do you have a fever, pain , or abdominal swelling? No. Pain Score  0 *  Have you tolerated food without any problems? Yes.    Have you been able to return to your normal activities? Yes.    Do you have any questions about your discharge instructions: Diet   No. Medications  No. Follow up visit  No.  Do you have questions or concerns about your Care? No.  Actions: * If pain score is 4 or above: No action needed, pain <4.  1. Have you developed a fever since your procedure? no  2.   Have you had an respiratory symptoms (SOB or cough) since your procedure? no  3.   Have you tested positive for COVID 19 since your procedure no  4.   Have you had any family members/close contacts diagnosed with the COVID 19 since your procedure?  no   If yes to any of these questions please route to Joylene John, RN and Joella Prince, RN

## 2020-04-14 ENCOUNTER — Encounter: Payer: Self-pay | Admitting: Gastroenterology

## 2020-05-11 MED FILL — TRULICITY 0.75 MG/0.5 ML PE: 0.75 | 84 days supply | Qty: 6 | Fill #2

## 2020-05-20 MED FILL — BUPROPION HCL SR 150 MG TAB: 150 | 30 days supply | Qty: 60 | Fill #2

## 2020-05-25 MED FILL — AZELASTINE 0.15% NASAL SPRY: 0.15 | 25 days supply | Qty: 30 | Fill #4

## 2020-05-26 DIAGNOSIS — G4733 Obstructive sleep apnea (adult) (pediatric): Secondary | ICD-10-CM | POA: Diagnosis not present

## 2020-05-26 DIAGNOSIS — Z79899 Other long term (current) drug therapy: Secondary | ICD-10-CM | POA: Diagnosis not present

## 2020-05-26 DIAGNOSIS — F419 Anxiety disorder, unspecified: Secondary | ICD-10-CM | POA: Diagnosis not present

## 2020-05-26 DIAGNOSIS — G4710 Hypersomnia, unspecified: Secondary | ICD-10-CM | POA: Diagnosis not present

## 2020-05-28 DIAGNOSIS — H26491 Other secondary cataract, right eye: Secondary | ICD-10-CM | POA: Diagnosis not present

## 2020-06-04 DIAGNOSIS — R41844 Frontal lobe and executive function deficit: Secondary | ICD-10-CM | POA: Diagnosis not present

## 2020-06-05 DIAGNOSIS — H26491 Other secondary cataract, right eye: Secondary | ICD-10-CM | POA: Diagnosis not present

## 2020-06-12 ENCOUNTER — Ambulatory Visit (INDEPENDENT_AMBULATORY_CARE_PROVIDER_SITE_OTHER): Payer: 59 | Admitting: Pulmonary Disease

## 2020-06-12 ENCOUNTER — Encounter: Payer: Self-pay | Admitting: Pulmonary Disease

## 2020-06-12 ENCOUNTER — Other Ambulatory Visit: Payer: Self-pay

## 2020-06-12 VITALS — BP 122/68 | HR 65 | Temp 98.0°F | Ht 73.0 in | Wt 188.6 lb

## 2020-06-12 DIAGNOSIS — G4733 Obstructive sleep apnea (adult) (pediatric): Secondary | ICD-10-CM

## 2020-06-12 DIAGNOSIS — G4752 REM sleep behavior disorder: Secondary | ICD-10-CM | POA: Diagnosis not present

## 2020-06-12 NOTE — Assessment & Plan Note (Addendum)
This diagnosis is still questionable in my opinion, Beverly's description is certainly indicated of but not fully convincing that he is acting out his dreams.  I agree with trial of melatonin.  I am happy that imaging has not revealed any organic brain disorder.  He does not seem to have any signs of extrapyramidal disorder.  His prior PSG did not show increased muscle tone during REM sleep.  If his symptomatology worsens then we may repeat. I would also be willing to trial low-dose benzodiazepines if this worsens in the future.  For now continue with melatonin

## 2020-06-12 NOTE — Patient Instructions (Signed)
°  Schedule home sleep test -use oral appliance at night of study.  Continue on melatonin take 1 to 2 hours before bedtime Please let me know in 1 month how this is working with acting out your dreams  We discussed Provigil

## 2020-06-12 NOTE — Assessment & Plan Note (Signed)
We reviewed his sleep studies, only mild degree of OSA was noted on home sleep test, oxygen desaturation burden is not very high.  His symptoms are out of proportion to this degree of sleep disordered breathing  I certainly would agree with a trial of treatment, he has already started using oral appliance and I would like to repeat a home sleep test with him wearing the appliance to see if numbers appear better.  If not then we can undertake trial of CPAP therapy as proof of concept to see if we can improve his hypersomnolence and memory issues.  I do not think we have to go as far as inspire implant.

## 2020-06-12 NOTE — Progress Notes (Signed)
Subjective:    Patient ID: Timothy [name], male    DOB: 06/14/1956, 64 y.o.   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  He is accompanied by his wife Rise Paganini who provides independent history PMH -diabetes hyperlipidemia He underwent septoplasty in 2002 Mild OSA has been diagnosed in the past.  He trialed CPAP 15 years ago but did not tolerate this, his wife did not like the noise.  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 He has worked with the behavioral therapist and gone through some medications including sertraline, Lexapro and is now on bupropion.  He underwent neurocognitive testing, I reviewed his neurological evaluation by Georgetta Haber at [facility].  PET scan was normal and MRI 01/2020 showed mildly age advanced volume loss without a frontal lobe or hippocampal predominance.  There was some concern raised about REM behavior disorder but again imaging or exam did not show any evidence of extrapyramidal involvement .  Epworth sleepiness score is 10 and he reports some degree of somnolence after 2 PM while working in clinic.  He drinks 4 cups of coffee in the morning and needs a soda to get through his afternoon. Bedtime is around 9:30 PM, sleep latency is minimal, he has recently started taking melatonin 15 mg for the past week and this is decreased sleep latency, reports 2-3 nocturnal awakenings occasional nocturia and is out of bed around 5:30 AM feeling rested without dryness of mouth or headaches. There is no history suggestive of cataplexy, sleep paralysis or parasomnias  Rise Paganini reports that he is often flailing his arms and legs in his  sleep and seems to be acting out his dreams on occasion.  [name] remembers his dreams first thing in the morning and is generally related to his prior work with the group.  He has completely cut out alcohol now  Significant tests/ events reviewed  10/2014 PSG-185 pounds -RDI 6/hour, AHI 4.8/hour no significant desaturations  10/2019 HST-180 pounds-AHI 8.5/hour    Past Medical History:  Diagnosis Date  . Acute meniscal tear of knee   . Environmental allergies   . GERD (gastroesophageal reflux disease)   . History of basal cell carcinoma (BCC) excision   . History of detached retina repair    left s/p laser 2019;  right s/p surgical repair 02/ 2015  . History of malignant melanoma of skin    04-26-2019 per pt fall 2019 excision from back, localized, and no recurrence  . History of prostate cancer urologist --- dr Alinda Money   dx 2016-- Stage T1c, Gleason 3+3;  05-11-2015 s/p  radical prostatectomy  (04-26-2019 per pt psa undetectable)  . Hx of adenomatous colonic polyps 01/18/2017  . Hyperlipidemia   . Mild obstructive sleep apnea    no longer has to use CPAP wt. loss (pt retested 10-30-2014 epic)  . OA (osteoarthritis)   . Sleep apnea    oral appliance   . Thrombocytopenia, unspecified ([place])    04-26-2019  per pt baseline plt count 150  . Type 2 diabetes mellitus ([place])    followed by pcp  (04-26-2019 check's cbg daily,  fasting cbg-- 110-120;  per  pt last A1c 5.8)  . Wears glasses   . Wears glasses    Past Surgical History:  Procedure Laterality Date  . CATARACT EXTRACTION W/ INTRAOCULAR LENS  IMPLANT, BILATERAL  01/2018  . COLONOSCOPY  last one 01-12-2017  . GAS INSERTION Right 08/19/2013   Procedure: INSERTION OF GAS;  Surgeon: Hurman Horn, MD;  Location: [facility];  Service: Ophthalmology;  Laterality: Right;  SF6  . KNEE ARTHROSCOPY Left 05/02/2019   Procedure: ARTHROSCOPY KNEE evaluation under anesthesia debridement partial medial meniscectomy chondroplasty;  Surgeon: Sydnee Cabal, MD;  Location: [facility];  Service: Orthopedics;  Laterality: Left;  Anesthesia - Femoral nerve block/knee block/IV sedation  . MASS EXCISION Left 07/08/2014   Procedure: EXCISION OF KERATOACANTHOMA LEFT LOWER LEG, AND EXCISION OF BASIL CELL CARCINOMA FROM NECK;  Surgeon: Armandina Gemma, MD;  Location: [facility];  Service: General;  Laterality: Left;  left lower leg and posterior neck  . NASAL SEPTOPLASTY W/ TURBINOPLASTY  12-13-2001  dr byers @[facility]   . PROSTATE BIOPSY  11/11/14  . ROBOT ASSISTED LAPAROSCOPIC RADICAL PROSTATECTOMY N/A 05/11/2015   Procedure: ROBOTIC ASSISTED LAPAROSCOPIC RADICAL PROSTATECTOMY LEVEL 1;  Surgeon: Raynelle Bring, MD;  Location: [facility];  Service: Urology;  Laterality: N/A;  . SCLERAL BUCKLE WITH CRYO Right 08/19/2013   Procedure: SCLERAL BUCKLE WITH CRYOPEXY;  Surgeon: Hurman Horn, MD;  Location: [place];  Service: Ophthalmology;  Laterality: Right;  . TONSILLECTOMY AND ADENOIDECTOMY  chiuld    Allergies  Allergen Reactions  . Sporanox [Itraconazole] Rash  . Ace Inhibitors Other (See Comments)    REACTION: COUGH    Social History   Socioeconomic History  . Marital status: Married    Spouse name: Not on file  . Number of children: Not on file  . Years of education: Not on file  . Highest education level: Not on file  Occupational History  . Occupation: physician  Tobacco Use  . Smoking status: Never Smoker  . Smokeless tobacco: Never Used  Vaping Use  . Vaping Use: Never used  Substance and Sexual Activity  . Alcohol use: Yes    Comment: 1-2 wine per night  . Drug use: Never  . Sexual activity: Not on file  Other Topics Concern  . Not on file  Social History Narrative  . Not on file   Social Determinants of Health   Financial Resource Strain: Not on file  Food Insecurity: Not on file  Transportation Needs: Not on file  Physical Activity: Not on file  Stress: Not on file  Social Connections: Not on file   Intimate Partner Violence: Not on file    Family History  Problem Relation Age of Onset  . Dementia Father   . Bowel Disease Father        ischemic bowl disease  . Kidney Stones Mother   . Colon cancer Neg Hx   . Colon polyps Neg Hx   . Esophageal cancer Neg Hx   . Rectal cancer Neg Hx   . Stomach cancer Neg Hx       Review of Systems Indigestion Weight gain 5 pounds Lack of exercise Headaches and nasal congestion Left earache Anxiety Behavior changes including trouble concentrating, irritable Somnolence in the afternoons    Objective:   Physical Exam  Gen. Pleasant, well-nourished, in no distress, normal affect ENT - no pallor,icterus, no post nasal drip, class 2 airway Neck: No JVD, no thyromegaly, no carotid bruits Lungs: no use of accessory  muscles, no dullness to percussion, clear without rales or rhonchi  Cardiovascular: Rhythm regular, heart sounds  normal, no murmurs or gallops, no peripheral edema Abdomen: soft and non-tender, no hepatosplenomegaly, BS normal. Musculoskeletal: No deformities, no cyanosis or clubbing Neuro:  alert, non focal       Assessment & Plan:

## 2020-06-15 ENCOUNTER — Other Ambulatory Visit (HOSPITAL_COMMUNITY): Payer: Self-pay | Admitting: Internal Medicine

## 2020-06-15 MED FILL — METFORMIN HCL 500 MG TABS: 500 | 90 days supply | Qty: 180 | Fill #0

## 2020-07-02 ENCOUNTER — Other Ambulatory Visit: Payer: Self-pay

## 2020-07-02 ENCOUNTER — Ambulatory Visit: Payer: 59

## 2020-07-02 DIAGNOSIS — G4733 Obstructive sleep apnea (adult) (pediatric): Secondary | ICD-10-CM | POA: Diagnosis not present

## 2020-07-07 DIAGNOSIS — G4733 Obstructive sleep apnea (adult) (pediatric): Secondary | ICD-10-CM | POA: Diagnosis not present

## 2020-07-27 ENCOUNTER — Other Ambulatory Visit (HOSPITAL_COMMUNITY): Payer: Self-pay | Admitting: Internal Medicine

## 2020-07-27 MED FILL — ATORVASTATIN CALCIUM 10 MG: 10 | 90 days supply | Qty: 90 | Fill #0

## 2020-07-28 DIAGNOSIS — R41844 Frontal lobe and executive function deficit: Secondary | ICD-10-CM | POA: Diagnosis not present

## 2020-07-30 DIAGNOSIS — E119 Type 2 diabetes mellitus without complications: Secondary | ICD-10-CM | POA: Diagnosis not present

## 2020-07-30 DIAGNOSIS — F419 Anxiety disorder, unspecified: Secondary | ICD-10-CM | POA: Diagnosis not present

## 2020-08-03 MED FILL — TRULICITY 0.75 MG/0.5 ML PE: 0.75 | 84 days supply | Qty: 6 | Fill #3

## 2020-08-05 MED FILL — AZELASTINE 0.15% NASAL SPRY: 0.15 | 25 days supply | Qty: 30 | Fill #5

## 2020-08-17 DIAGNOSIS — M1712 Unilateral primary osteoarthritis, left knee: Secondary | ICD-10-CM | POA: Diagnosis not present

## 2020-08-18 DIAGNOSIS — C61 Malignant neoplasm of prostate: Secondary | ICD-10-CM | POA: Diagnosis not present

## 2020-08-26 DIAGNOSIS — C61 Malignant neoplasm of prostate: Secondary | ICD-10-CM | POA: Diagnosis not present

## 2020-09-14 DIAGNOSIS — L570 Actinic keratosis: Secondary | ICD-10-CM | POA: Diagnosis not present

## 2020-09-14 DIAGNOSIS — Z85828 Personal history of other malignant neoplasm of skin: Secondary | ICD-10-CM | POA: Diagnosis not present

## 2020-09-14 DIAGNOSIS — D225 Melanocytic nevi of trunk: Secondary | ICD-10-CM | POA: Diagnosis not present

## 2020-09-14 DIAGNOSIS — Z85820 Personal history of malignant melanoma of skin: Secondary | ICD-10-CM | POA: Diagnosis not present

## 2020-09-14 DIAGNOSIS — L853 Xerosis cutis: Secondary | ICD-10-CM | POA: Diagnosis not present

## 2020-09-14 DIAGNOSIS — L821 Other seborrheic keratosis: Secondary | ICD-10-CM | POA: Diagnosis not present

## 2020-09-14 MED FILL — METFORMIN HCL 500 MG TABS: 500 | 90 days supply | Qty: 180 | Fill #1

## 2020-10-02 ENCOUNTER — Other Ambulatory Visit (HOSPITAL_BASED_OUTPATIENT_CLINIC_OR_DEPARTMENT_OTHER): Payer: Self-pay

## 2020-10-02 ENCOUNTER — Ambulatory Visit: Payer: 59 | Attending: Internal Medicine

## 2020-10-02 ENCOUNTER — Other Ambulatory Visit: Payer: Self-pay

## 2020-10-02 DIAGNOSIS — Z23 Encounter for immunization: Secondary | ICD-10-CM

## 2020-10-02 MED ORDER — COVID-19 MRNA VACCINE (PFIZER) 30 MCG/0.3ML IM SUSP
INTRAMUSCULAR | 0 refills | Status: DC
  Filled 2020-10-02: qty 0.3, 1d supply, fill #0

## 2020-10-02 NOTE — Progress Notes (Signed)
   Covid-19 Vaccination Clinic  Name:  Timothy [name]    MRN: [id number] DOB: 03-Feb-1956  10/02/2020  Timothy [name] was observed post Covid-19 immunization for 15 minutes without incident. He was provided with Vaccine Information Sheet and instruction to access the V-Safe system.   Timothy [name] was instructed to call 911 with any severe reactions post vaccine: [name] Kitchen Difficulty breathing  . Swelling of face and throat  . A fast heartbeat  . A bad rash all over body  . Dizziness and weakness   Immunizations Administered    Name Date Dose VIS Date Route   PFIZER Comrnaty(Gray TOP) Covid-19 Vaccine 10/02/2020  9:38 AM 0.3 mL 06/04/2020 Intramuscular   Manufacturer: [place]   Lot: [id number]   [place]: [phone]

## 2020-10-07 ENCOUNTER — Other Ambulatory Visit (HOSPITAL_COMMUNITY): Payer: Self-pay

## 2020-10-07 MED FILL — Azelastine HCl Nasal Spray 0.15% (205.5 MCG/SPRAY): NASAL | 25 days supply | Qty: 30 | Fill #0 | Status: AC

## 2020-10-08 ENCOUNTER — Other Ambulatory Visit (HOSPITAL_BASED_OUTPATIENT_CLINIC_OR_DEPARTMENT_OTHER): Payer: Self-pay

## 2020-10-19 ENCOUNTER — Other Ambulatory Visit (HOSPITAL_COMMUNITY): Payer: Self-pay

## 2020-10-19 MED ORDER — TRULICITY 0.75 MG/0.5ML ~~LOC~~ SOAJ
0.7500 mg | SUBCUTANEOUS | 3 refills | Status: DC
  Filled 2020-10-19: qty 6, 84d supply, fill #0
  Filled 2021-01-04: qty 6, 84d supply, fill #1
  Filled 2021-03-19: qty 6, 84d supply, fill #2
  Filled 2021-06-07: qty 6, 84d supply, fill #3

## 2020-11-08 MED FILL — Atorvastatin Calcium Tab 10 MG (Base Equivalent): ORAL | 90 days supply | Qty: 90 | Fill #0 | Status: AC

## 2020-11-09 ENCOUNTER — Other Ambulatory Visit (HOSPITAL_COMMUNITY): Payer: Self-pay

## 2020-12-02 ENCOUNTER — Other Ambulatory Visit (HOSPITAL_COMMUNITY): Payer: Self-pay

## 2020-12-02 MED FILL — Metformin HCl Tab 500 MG: ORAL | 90 days supply | Qty: 180 | Fill #0 | Status: AC

## 2020-12-19 ENCOUNTER — Other Ambulatory Visit (HOSPITAL_COMMUNITY): Payer: Self-pay

## 2020-12-21 ENCOUNTER — Other Ambulatory Visit (HOSPITAL_COMMUNITY): Payer: Self-pay

## 2020-12-21 MED ORDER — AZELASTINE HCL 0.15 % NA SOLN
1.0000 | Freq: Two times a day (BID) | NASAL | 11 refills | Status: AC | PRN
  Filled 2020-12-21: qty 30, 25d supply, fill #0

## 2021-01-01 ENCOUNTER — Other Ambulatory Visit: Payer: Self-pay | Admitting: Physician Assistant

## 2021-01-01 ENCOUNTER — Other Ambulatory Visit (HOSPITAL_BASED_OUTPATIENT_CLINIC_OR_DEPARTMENT_OTHER): Payer: Self-pay

## 2021-01-01 DIAGNOSIS — H60392 Other infective otitis externa, left ear: Secondary | ICD-10-CM

## 2021-01-01 MED ORDER — NEOMYCIN-POLYMYXIN-HC 3.5-10000-1 OT SUSP
OTIC | 0 refills | Status: DC
  Filled 2021-01-01: qty 10, 25d supply, fill #0

## 2021-01-01 MED ORDER — CIPRO HC 0.2-1 % OT SUSP
3.0000 [drp] | Freq: Two times a day (BID) | OTIC | 0 refills | Status: DC
  Filled 2021-01-01: qty 10, 7d supply, fill #0

## 2021-01-01 NOTE — Progress Notes (Signed)
error 

## 2021-01-01 NOTE — Progress Notes (Signed)
Patient did amwell visit and had to send prescription through epic

## 2021-01-04 ENCOUNTER — Other Ambulatory Visit (HOSPITAL_COMMUNITY): Payer: Self-pay

## 2021-01-20 DIAGNOSIS — Z0000 Encounter for general adult medical examination without abnormal findings: Secondary | ICD-10-CM | POA: Diagnosis not present

## 2021-01-20 DIAGNOSIS — E119 Type 2 diabetes mellitus without complications: Secondary | ICD-10-CM | POA: Diagnosis not present

## 2021-01-20 DIAGNOSIS — Z85820 Personal history of malignant melanoma of skin: Secondary | ICD-10-CM | POA: Diagnosis not present

## 2021-01-20 DIAGNOSIS — F419 Anxiety disorder, unspecified: Secondary | ICD-10-CM | POA: Diagnosis not present

## 2021-01-20 DIAGNOSIS — E7800 Pure hypercholesterolemia, unspecified: Secondary | ICD-10-CM | POA: Diagnosis not present

## 2021-01-20 DIAGNOSIS — Z8546 Personal history of malignant neoplasm of prostate: Secondary | ICD-10-CM | POA: Diagnosis not present

## 2021-01-20 DIAGNOSIS — G4733 Obstructive sleep apnea (adult) (pediatric): Secondary | ICD-10-CM | POA: Diagnosis not present

## 2021-01-26 ENCOUNTER — Other Ambulatory Visit (HOSPITAL_BASED_OUTPATIENT_CLINIC_OR_DEPARTMENT_OTHER): Payer: Self-pay

## 2021-01-27 ENCOUNTER — Other Ambulatory Visit (HOSPITAL_COMMUNITY): Payer: Self-pay

## 2021-01-27 MED FILL — Atorvastatin Calcium Tab 10 MG (Base Equivalent): ORAL | 90 days supply | Qty: 90 | Fill #1 | Status: AC

## 2021-01-29 DIAGNOSIS — H6123 Impacted cerumen, bilateral: Secondary | ICD-10-CM | POA: Diagnosis not present

## 2021-02-22 ENCOUNTER — Other Ambulatory Visit (HOSPITAL_COMMUNITY): Payer: Self-pay

## 2021-02-22 MED FILL — Metformin HCl Tab 500 MG: ORAL | 90 days supply | Qty: 180 | Fill #1 | Status: AC

## 2021-03-19 ENCOUNTER — Other Ambulatory Visit (HOSPITAL_COMMUNITY): Payer: Self-pay

## 2021-03-22 ENCOUNTER — Other Ambulatory Visit (HOSPITAL_COMMUNITY): Payer: Self-pay

## 2021-04-19 ENCOUNTER — Other Ambulatory Visit (HOSPITAL_COMMUNITY): Payer: Self-pay

## 2021-04-19 MED FILL — Atorvastatin Calcium Tab 10 MG (Base Equivalent): ORAL | 90 days supply | Qty: 90 | Fill #2 | Status: AC

## 2021-05-06 DIAGNOSIS — L853 Xerosis cutis: Secondary | ICD-10-CM | POA: Diagnosis not present

## 2021-05-06 DIAGNOSIS — D225 Melanocytic nevi of trunk: Secondary | ICD-10-CM | POA: Diagnosis not present

## 2021-05-06 DIAGNOSIS — L821 Other seborrheic keratosis: Secondary | ICD-10-CM | POA: Diagnosis not present

## 2021-05-06 DIAGNOSIS — D2262 Melanocytic nevi of left upper limb, including shoulder: Secondary | ICD-10-CM | POA: Diagnosis not present

## 2021-05-06 DIAGNOSIS — D485 Neoplasm of uncertain behavior of skin: Secondary | ICD-10-CM | POA: Diagnosis not present

## 2021-05-06 DIAGNOSIS — D2261 Melanocytic nevi of right upper limb, including shoulder: Secondary | ICD-10-CM | POA: Diagnosis not present

## 2021-05-06 DIAGNOSIS — Z85820 Personal history of malignant melanoma of skin: Secondary | ICD-10-CM | POA: Diagnosis not present

## 2021-05-06 DIAGNOSIS — D224 Melanocytic nevi of scalp and neck: Secondary | ICD-10-CM | POA: Diagnosis not present

## 2021-05-06 DIAGNOSIS — Z85828 Personal history of other malignant neoplasm of skin: Secondary | ICD-10-CM | POA: Diagnosis not present

## 2021-05-21 ENCOUNTER — Other Ambulatory Visit (HOSPITAL_COMMUNITY): Payer: Self-pay

## 2021-05-24 ENCOUNTER — Other Ambulatory Visit (HOSPITAL_COMMUNITY): Payer: Self-pay

## 2021-05-24 MED ORDER — METFORMIN HCL 500 MG PO TABS
1000.0000 mg | ORAL_TABLET | Freq: Every day | ORAL | 3 refills | Status: DC
  Filled 2021-05-24: qty 180, 90d supply, fill #0
  Filled 2021-08-20: qty 180, 90d supply, fill #1
  Filled 2021-11-19: qty 180, 90d supply, fill #2

## 2021-06-04 ENCOUNTER — Other Ambulatory Visit (HOSPITAL_COMMUNITY): Payer: Self-pay

## 2021-06-04 MED ORDER — ACCU-CHEK GUIDE VI STRP
ORAL_STRIP | 3 refills | Status: DC
  Filled 2021-06-04: qty 100, 30d supply, fill #0

## 2021-06-08 ENCOUNTER — Other Ambulatory Visit (HOSPITAL_COMMUNITY): Payer: Self-pay

## 2021-06-08 MED ORDER — TRULICITY 0.75 MG/0.5ML ~~LOC~~ SOAJ
0.7500 mg | SUBCUTANEOUS | 3 refills | Status: DC
  Filled 2021-08-20: qty 6, 84d supply, fill #0
  Filled 2021-11-19: qty 6, 84d supply, fill #1

## 2021-06-08 MED ORDER — FREESTYLE LITE TEST VI STRP
ORAL_STRIP | 3 refills | Status: DC
  Filled 2021-06-08: qty 150, 75d supply, fill #0
  Filled 2021-12-10 – 2021-12-15 (×2): qty 150, 75d supply, fill #1

## 2021-06-08 MED ORDER — FREESTYLE LANCETS MISC
3 refills | Status: DC
  Filled 2021-06-08: qty 200, 90d supply, fill #0

## 2021-06-09 ENCOUNTER — Other Ambulatory Visit (HOSPITAL_COMMUNITY): Payer: Self-pay

## 2021-06-10 ENCOUNTER — Other Ambulatory Visit (HOSPITAL_COMMUNITY): Payer: Self-pay

## 2021-06-10 MED ORDER — FREESTYLE LITE W/DEVICE KIT
PACK | 0 refills | Status: DC
  Filled 2021-06-10: qty 1, 1d supply, fill #0

## 2021-07-02 DIAGNOSIS — H5201 Hypermetropia, right eye: Secondary | ICD-10-CM | POA: Diagnosis not present

## 2021-07-15 ENCOUNTER — Other Ambulatory Visit (HOSPITAL_COMMUNITY): Payer: Self-pay

## 2021-07-15 MED ORDER — ATORVASTATIN CALCIUM 10 MG PO TABS
10.0000 mg | ORAL_TABLET | Freq: Every day | ORAL | 3 refills | Status: DC
  Filled 2021-07-15: qty 90, 90d supply, fill #0
  Filled 2021-11-19: qty 90, 90d supply, fill #1
  Filled 2022-02-07: qty 90, 90d supply, fill #2

## 2021-08-03 ENCOUNTER — Other Ambulatory Visit (HOSPITAL_COMMUNITY): Payer: Self-pay

## 2021-08-03 DIAGNOSIS — Z23 Encounter for immunization: Secondary | ICD-10-CM | POA: Diagnosis not present

## 2021-08-03 DIAGNOSIS — R41844 Frontal lobe and executive function deficit: Secondary | ICD-10-CM | POA: Diagnosis not present

## 2021-08-03 DIAGNOSIS — Z79899 Other long term (current) drug therapy: Secondary | ICD-10-CM | POA: Diagnosis not present

## 2021-08-03 DIAGNOSIS — E119 Type 2 diabetes mellitus without complications: Secondary | ICD-10-CM | POA: Diagnosis not present

## 2021-08-03 MED ORDER — METHYLPHENIDATE HCL 5 MG PO TABS
5.0000 mg | ORAL_TABLET | Freq: Every day | ORAL | 0 refills | Status: DC
  Filled 2021-08-03: qty 30, 30d supply, fill #0

## 2021-08-05 ENCOUNTER — Telehealth: Payer: Self-pay | Admitting: Pulmonary Disease

## 2021-08-05 DIAGNOSIS — G4752 REM sleep behavior disorder: Secondary | ICD-10-CM

## 2021-08-05 NOTE — Telephone Encounter (Signed)
Phone call with Timothy [name]. He is undergoing neurology follow-up for neurocognitive decline.  Continues to be tired in the afternoons.  Wife has noted that he is moving around in his sleep quite a bit and punching and occasionally has bruised her.  Neurologist was concerned about REM behavior disorder and recommended formal testing  We discussed implications of this diagnosis. Proceed with NPSG and look for REM muscle tone

## 2021-08-06 DIAGNOSIS — M25562 Pain in left knee: Secondary | ICD-10-CM | POA: Diagnosis not present

## 2021-08-11 ENCOUNTER — Other Ambulatory Visit (HOSPITAL_COMMUNITY): Payer: Self-pay

## 2021-08-11 MED ORDER — METHYLPHENIDATE HCL 5 MG PO TABS
5.0000 mg | ORAL_TABLET | Freq: Every day | ORAL | 0 refills | Status: DC
  Filled 2021-08-30 – 2021-08-31 (×2): qty 30, 30d supply, fill #0

## 2021-08-20 ENCOUNTER — Other Ambulatory Visit (HOSPITAL_COMMUNITY): Payer: Self-pay

## 2021-08-26 ENCOUNTER — Other Ambulatory Visit (HOSPITAL_COMMUNITY): Payer: Self-pay

## 2021-08-30 ENCOUNTER — Other Ambulatory Visit (HOSPITAL_COMMUNITY): Payer: Self-pay

## 2021-08-31 ENCOUNTER — Other Ambulatory Visit (HOSPITAL_COMMUNITY): Payer: Self-pay

## 2021-09-06 ENCOUNTER — Other Ambulatory Visit: Payer: Self-pay

## 2021-09-06 ENCOUNTER — Ambulatory Visit (HOSPITAL_BASED_OUTPATIENT_CLINIC_OR_DEPARTMENT_OTHER): Payer: Medicare Other | Attending: Pulmonary Disease | Admitting: Pulmonary Disease

## 2021-09-06 DIAGNOSIS — G4752 REM sleep behavior disorder: Secondary | ICD-10-CM | POA: Insufficient documentation

## 2021-09-06 DIAGNOSIS — R0683 Snoring: Secondary | ICD-10-CM | POA: Diagnosis not present

## 2021-09-07 DIAGNOSIS — G4752 REM sleep behavior disorder: Secondary | ICD-10-CM

## 2021-09-07 NOTE — Procedures (Signed)
Patient Name: Timothy [name], Timothy [name] ?Study Date: 09/06/2021 ?Gender: Male ?D.O.B: 1955-07-19 ?Age (years): 29 ?Referring Provider: Kara Mead MD, ABSM ?Height (inches): 73 ?Interpreting Physician: Kara Mead MD, ABSM ?Weight (lbs): 186 ?RPSGT: Zadie Rhine ?BMI: 25 ?MRN: [id number] ?Neck Size: 16.00 ?<br> <br> ?CLINICAL INFORMATION ?Sleep Study Type: NPSG ? ? ? ?Indication for sleep study: Restless Sleep with Limb Movments , concernf or REM behavior disorder ? ? ? ?Epworth Sleepiness Score: 5 ? ? ? ?SLEEP STUDY TECHNIQUE ?As per the AASM Manual for the Scoring of Sleep and Associated Events v2.3 (April 2016) with a hypopnea requiring 4% desaturations. ? ?The channels recorded and monitored were frontal, central and occipital EEG, electrooculogram (EOG), submentalis EMG (chin), nasal and oral airflow, thoracic and abdominal wall motion, anterior tibialis EMG, snore microphone, electrocardiogram, and pulse oximetry. ? ?MEDICATIONS ?Medications self-administered by patient taken the night of the study : N/A ? ?SLEEP ARCHITECTURE ?The study was initiated at 10:09:09 PM and ended at 4:39:03 AM. ? ?Sleep onset time was 4.2 minutes and the sleep efficiency was 92.7%%. The total sleep time was 361.5 minutes. ? ?Stage REM latency was 68.5 minutes. ? ?The patient spent 4.8%% of the night in stage N1 sleep, 56.8%% in stage N2 sleep, 14.0%% in stage N3 and 24.3% in REM. ? ?Alpha intrusion was absent. ? ?Supine sleep was 97.89%. ? ?RESPIRATORY PARAMETERS ?The overall apnea/hypopnea index (AHI) was 4.0 per hour. There were 0 total apneas, including 0 obstructive, 0 central and 0 mixed apneas. There were 24 hypopneas and 6 RERAs. ? ?The AHI during Stage REM sleep was 15.0 per hour. ? ?AHI while supine was 4.1 per hour. ? ?The mean oxygen saturation was 91.0%. The minimum SpO2 during sleep was 87.0%. ? ?soft snoring was noted during this study. ? ?CARDIAC DATA ?The 2 lead EKG demonstrated sinus rhythm. The mean heart rate was 59.5 beats  per minute. Other EKG findings include: None. ? ? ?LEG MOVEMENT DATA ?The total PLMS were 0 with a resulting PLMS index of 0.0. Associated arousal with leg movement index was 1.5 . ? ?IMPRESSIONS ?- No significant obstructive sleep apnea occurred during this study (AHI = 4.0/h). ?- Mild oxygen desaturation was noted during this study (Min O2 = 87.0%). ?- The patient snored with soft snoring volume. ?- No cardiac abnormalities were noted during this study. ?- Clinically significant periodic limb movements did not occur during sleep. No significant associated arousals. ?- Good REM progresion was noted with 88 minutes of REM sleep. NO lack of muscle atonia was noted ? ?DIAGNOSIS ?- No evidence of sleep disordered breathing or RBD ? ?RECOMMENDATIONS ?- Avoid alcohol, sedatives and other CNS depressants that may worsen sleep apnea and disrupt normal sleep architecture. ?- Sleep hygiene should be reviewed to assess factors that may improve sleep quality. ?- Weight management and regular exercise should be initiated or continued if appropriate. ? ? ?Kara Mead MD ?Board Certified in Sleep medicine ? ?

## 2021-09-08 DIAGNOSIS — M1712 Unilateral primary osteoarthritis, left knee: Secondary | ICD-10-CM | POA: Diagnosis not present

## 2021-09-14 ENCOUNTER — Other Ambulatory Visit (HOSPITAL_COMMUNITY): Payer: Self-pay

## 2021-09-14 DIAGNOSIS — R41840 Attention and concentration deficit: Secondary | ICD-10-CM | POA: Diagnosis not present

## 2021-09-14 DIAGNOSIS — Z79899 Other long term (current) drug therapy: Secondary | ICD-10-CM | POA: Diagnosis not present

## 2021-09-14 DIAGNOSIS — Z883 Allergy status to other anti-infective agents status: Secondary | ICD-10-CM | POA: Diagnosis not present

## 2021-09-14 DIAGNOSIS — R4189 Other symptoms and signs involving cognitive functions and awareness: Secondary | ICD-10-CM | POA: Diagnosis not present

## 2021-09-14 DIAGNOSIS — Z888 Allergy status to other drugs, medicaments and biological substances status: Secondary | ICD-10-CM | POA: Diagnosis not present

## 2021-09-14 MED ORDER — METHYLPHENIDATE HCL 5 MG PO TABS
5.0000 mg | ORAL_TABLET | Freq: Every day | ORAL | 0 refills | Status: DC
  Filled 2021-09-23: qty 30, 30d supply, fill #0

## 2021-09-14 MED ORDER — METHYLPHENIDATE HCL 5 MG PO TABS
5.0000 mg | ORAL_TABLET | Freq: Every day | ORAL | 0 refills | Status: DC
  Filled 2021-11-29: qty 30, 30d supply, fill #0

## 2021-09-14 MED ORDER — METHYLPHENIDATE HCL 5 MG PO TABS
5.0000 mg | ORAL_TABLET | Freq: Every day | ORAL | 0 refills | Status: DC
Start: 2021-10-29 — End: 2022-02-07
  Filled 2021-10-29: qty 30, 30d supply, fill #0

## 2021-09-17 ENCOUNTER — Other Ambulatory Visit (HOSPITAL_COMMUNITY): Payer: Self-pay

## 2021-09-17 DIAGNOSIS — M1712 Unilateral primary osteoarthritis, left knee: Secondary | ICD-10-CM | POA: Diagnosis not present

## 2021-09-22 ENCOUNTER — Other Ambulatory Visit (HOSPITAL_COMMUNITY): Payer: Self-pay

## 2021-09-22 DIAGNOSIS — M1712 Unilateral primary osteoarthritis, left knee: Secondary | ICD-10-CM | POA: Diagnosis not present

## 2021-09-23 ENCOUNTER — Other Ambulatory Visit (HOSPITAL_COMMUNITY): Payer: Self-pay

## 2021-10-14 DIAGNOSIS — M25562 Pain in left knee: Secondary | ICD-10-CM | POA: Diagnosis not present

## 2021-10-14 DIAGNOSIS — M1712 Unilateral primary osteoarthritis, left knee: Secondary | ICD-10-CM | POA: Diagnosis not present

## 2021-10-29 ENCOUNTER — Other Ambulatory Visit (HOSPITAL_COMMUNITY): Payer: Self-pay

## 2021-11-04 ENCOUNTER — Other Ambulatory Visit (HOSPITAL_BASED_OUTPATIENT_CLINIC_OR_DEPARTMENT_OTHER): Payer: Self-pay

## 2021-11-04 DIAGNOSIS — Z85828 Personal history of other malignant neoplasm of skin: Secondary | ICD-10-CM | POA: Diagnosis not present

## 2021-11-04 DIAGNOSIS — L738 Other specified follicular disorders: Secondary | ICD-10-CM | POA: Diagnosis not present

## 2021-11-04 DIAGNOSIS — D225 Melanocytic nevi of trunk: Secondary | ICD-10-CM | POA: Diagnosis not present

## 2021-11-04 DIAGNOSIS — Z85820 Personal history of malignant melanoma of skin: Secondary | ICD-10-CM | POA: Diagnosis not present

## 2021-11-19 ENCOUNTER — Other Ambulatory Visit (HOSPITAL_COMMUNITY): Payer: Self-pay

## 2021-11-29 ENCOUNTER — Other Ambulatory Visit (HOSPITAL_COMMUNITY): Payer: Self-pay

## 2021-12-10 ENCOUNTER — Other Ambulatory Visit (HOSPITAL_COMMUNITY): Payer: Self-pay

## 2021-12-13 ENCOUNTER — Other Ambulatory Visit (HOSPITAL_COMMUNITY): Payer: Self-pay

## 2021-12-14 ENCOUNTER — Other Ambulatory Visit (HOSPITAL_COMMUNITY): Payer: Self-pay

## 2021-12-15 ENCOUNTER — Other Ambulatory Visit (HOSPITAL_COMMUNITY): Payer: Self-pay

## 2021-12-16 ENCOUNTER — Other Ambulatory Visit (HOSPITAL_COMMUNITY): Payer: Self-pay

## 2021-12-16 MED ORDER — GLUCOSE BLOOD VI STRP
ORAL_STRIP | 3 refills | Status: DC
  Filled 2021-12-16: qty 150, 75d supply, fill #0
  Filled 2022-06-26: qty 150, 75d supply, fill #1

## 2021-12-16 MED ORDER — CONTOUR NEXT GEN MONITOR W/DEVICE KIT
1.0000 | PACK | Freq: Every day | 0 refills | Status: DC
  Filled 2021-12-16: qty 1, 1d supply, fill #0

## 2021-12-16 MED ORDER — ONETOUCH DELICA PLUS LANCET33G MISC
3 refills | Status: AC
  Filled 2021-12-16: qty 100, 50d supply, fill #0
  Filled 2022-06-26: qty 100, 50d supply, fill #1

## 2021-12-16 MED ORDER — CONTOUR NEXT TEST VI STRP
1.0000 | ORAL_STRIP | Freq: Every day | 3 refills | Status: DC
  Filled 2021-12-16: qty 50, 50d supply, fill #0

## 2021-12-16 MED ORDER — BLOOD GLUCOSE METER KIT
PACK | 0 refills | Status: AC
  Filled 2021-12-16: qty 1, 30d supply, fill #0

## 2021-12-21 ENCOUNTER — Other Ambulatory Visit (HOSPITAL_COMMUNITY): Payer: Self-pay

## 2022-01-25 DIAGNOSIS — E7800 Pure hypercholesterolemia, unspecified: Secondary | ICD-10-CM | POA: Diagnosis not present

## 2022-01-25 DIAGNOSIS — Z79899 Other long term (current) drug therapy: Secondary | ICD-10-CM | POA: Diagnosis not present

## 2022-01-25 DIAGNOSIS — M1712 Unilateral primary osteoarthritis, left knee: Secondary | ICD-10-CM | POA: Diagnosis not present

## 2022-01-25 DIAGNOSIS — E119 Type 2 diabetes mellitus without complications: Secondary | ICD-10-CM | POA: Diagnosis not present

## 2022-01-25 DIAGNOSIS — Z0000 Encounter for general adult medical examination without abnormal findings: Secondary | ICD-10-CM | POA: Diagnosis not present

## 2022-01-26 ENCOUNTER — Other Ambulatory Visit (HOSPITAL_COMMUNITY): Payer: Self-pay

## 2022-01-26 MED ORDER — METFORMIN HCL 500 MG PO TABS
1000.0000 mg | ORAL_TABLET | Freq: Every day | ORAL | 3 refills | Status: DC
  Filled 2022-01-26 (×2): qty 180, 90d supply, fill #0
  Filled 2022-05-04: qty 180, 90d supply, fill #1
  Filled 2022-08-08: qty 180, 90d supply, fill #2
  Filled 2022-11-10: qty 180, 90d supply, fill #3

## 2022-01-26 MED ORDER — TRULICITY 0.75 MG/0.5ML ~~LOC~~ SOAJ
0.7500 mg | SUBCUTANEOUS | 3 refills | Status: DC
  Filled 2022-01-26: qty 6, 84d supply, fill #0
  Filled 2022-01-26: qty 2, 28d supply, fill #0
  Filled 2022-03-04: qty 2, 28d supply, fill #1
  Filled 2022-04-03: qty 2, 28d supply, fill #2
  Filled 2022-05-04: qty 2, 28d supply, fill #3
  Filled 2022-05-31: qty 2, 28d supply, fill #4
  Filled 2022-06-26: qty 2, 28d supply, fill #5
  Filled 2022-07-29: qty 2, 28d supply, fill #6

## 2022-02-03 NOTE — Progress Notes (Signed)
Timothy Bear, MD Reason for referral-chest pain  HPI: 66 year old male for evaluation of chest pain at request of Lavone Orn, MD. Patient states that he had a calcium score years ago that was elevated (I do not have those results available).  A follow-up stress nuclear study apparently showed no ischemia.  Otherwise no cardiac history.  Over the past several months he has noticed an occasional pain in his left upper chest described as an ache.  No associated symptoms.  Occurs with activities such as long walks, walking up hills or did have an episode while swimming.  Relieved with rest.  He denies dyspnea on exertion, orthopnea, PND, pedal edema and no history of syncope.  Cardiology now asked to evaluate.  Current Outpatient Medications  Medication Sig Dispense Refill   acetaminophen (TYLENOL) 500 MG tablet Take 500 mg by mouth every 6 (six) hours as needed.     atorvastatin (LIPITOR) 10 MG tablet Take 10 mg by mouth daily.   3   atorvastatin (LIPITOR) 10 MG tablet Take 1 tablet (10 mg total) by mouth daily. 90 tablet 3   atovaquone-proguanil (MALARONE) 250-100 MG TABS tablet Take 1 tablet by mouth daily starting 2 days prior to trip, take daily during stay, & continue for 1 week after return. 17 tablet 0   Azelastine HCl 0.15 % SOLN SMARTSIG:1-2 Spray(s) Both Nares Twice Daily PRN     Azelastine HCl 0.15 % SOLN Place 1-2 sprays into the nose 2 (two) times daily as needed. 30 mL 11   blood glucose meter kit and supplies Use to check blood sugar 1 to 2 times daily 1 each 0   cetirizine (ZYRTEC) 10 MG tablet Take 10 mg by mouth daily.     ciprofloxacin (CIPRO) 500 MG tablet Take 1 tablet (500 mg total) by mouth every 12 (twelve) hours. 6 tablet 0   Dulaglutide (TRULICITY) 1.06 YI/9.4WN SOPN Inject 0.75 mg into the skin once a week. 6 mL 3   Dulaglutide (TRULICITY) 4.62 VO/3.5KK SOPN Inject 0.75 mg into the skin once a week. 6 mL 3   famotidine (PEPCID) 20 MG tablet Take 10 mg by  mouth daily as needed for heartburn or indigestion.      fluticasone (FLONASE) 50 MCG/ACT nasal spray Place 2 sprays into both nostrils daily.     glucose blood test strip 1 each daily as needed.     glucose blood test strip Use to test blood sugar 1 to 2 times per day 200 each 3   Lancets (FREESTYLE) lancets 1 each daily as needed.     Lancets (ONETOUCH DELICA PLUS [id number]) MISC Use to test blood sugar 1 to 2 times per day 200 each 3   metFORMIN (GLUCOPHAGE) 500 MG tablet Take 500 mg by mouth 2 (two) times daily with a meal.      metFORMIN (GLUCOPHAGE) 500 MG tablet Take 2 tablets (1,000 mg total) by mouth daily. 180 tablet 3   methylphenidate (RITALIN) 5 MG tablet Take 1 tablet (5 mg total) by mouth daily. 30 tablet 0   methylphenidate (RITALIN) 5 MG tablet Take 1 tablet (5 mg total) by mouth daily. 30 tablet 0   Multiple Vitamins-Minerals (CENTRUM SILVER 50+MEN) TABS Take 1 tablet by mouth daily.     Omega-3 Fatty Acids (OMEGA-3 FISH OIL PO) Take 1 capsule by mouth daily.     TRULICITY 7.16 RC/7.8LF SOPN Inject 0.75 mg into the skin once a week. Monday's  11   typhoid (VIVOTIF)  DR capsule Take 1 capsule by mouth 1 hour before a meal for 4 doses. 4 capsule 0   Azelastine HCl 0.15 % SOLN INSTILL 1 TO 2 SPRAYS IN EACH NOSTRIL TWO TIMES DAILY AS NEEDED 30 mL 11   Current Facility-Administered Medications  Medication Dose Route Frequency Provider Last Rate Last Admin   0.9 %  sodium chloride infusion  500 mL Intravenous Once Ladene Artist, MD        Allergies  Allergen Reactions   Sporanox [Itraconazole] Rash   Ace Inhibitors Other (See Comments)    REACTION: COUGH     Past Medical History:  Diagnosis Date   Acute meniscal tear of knee    Environmental allergies    GERD (gastroesophageal reflux disease)    History of basal cell carcinoma (BCC) excision    History of detached retina repair    left s/p laser 2019;  right s/p surgical repair 02/ 2015   History of malignant  melanoma of skin    04-26-2019 per pt fall 2019 excision from back, localized, and no recurrence   History of prostate cancer urologist --- dr Alinda Money   dx 2016-- Stage T1c, Gleason 3+3;  05-11-2015 s/p  radical prostatectomy  (04-26-2019 per pt psa undetectable)   Hx of adenomatous colonic polyps 01/18/2017   Hyperlipidemia    Mild obstructive sleep apnea    no longer has to use CPAP wt. loss (pt retested 10-30-2014 epic)   OA (osteoarthritis)    Sleep apnea    oral appliance    Thrombocytopenia, unspecified ([place])    04-26-2019  per pt baseline plt count 150   Type 2 diabetes mellitus ([place])    followed by pcp  (04-26-2019 check's cbg daily,  fasting cbg-- 110-120;  per pt last A1c 5.8)   Wears glasses     Past Surgical History:  Procedure Laterality Date   CATARACT EXTRACTION W/ INTRAOCULAR LENS  IMPLANT, BILATERAL  01/2018   COLONOSCOPY  last one 01-12-2017   GAS INSERTION Right 08/19/2013   Procedure: INSERTION OF GAS;  Surgeon: Hurman Horn, MD;  Location: [place];  Service: Ophthalmology;  Laterality: Right;  SF6   KNEE ARTHROSCOPY Left 05/02/2019   Procedure: ARTHROSCOPY KNEE evaluation under anesthesia debridement partial medial meniscectomy chondroplasty;  Surgeon: Sydnee Cabal, MD;  Location: [facility];  Service: Orthopedics;  Laterality: Left;  Anesthesia - Femoral nerve block/knee block/IV sedation   MASS EXCISION Left 07/08/2014   Procedure: EXCISION OF KERATOACANTHOMA LEFT LOWER LEG, AND EXCISION OF BASIL CELL CARCINOMA FROM NECK;  Surgeon: Armandina Gemma, MD;  Location: [facility];  Service: General;  Laterality: Left;  left lower leg and posterior neck   NASAL SEPTOPLASTY W/ TURBINOPLASTY  12-13-2001  dr byers @[facility]    PROSTATE BIOPSY  11/11/14   ROBOT ASSISTED LAPAROSCOPIC RADICAL PROSTATECTOMY N/A 05/11/2015   Procedure: ROBOTIC ASSISTED LAPAROSCOPIC RADICAL PROSTATECTOMY LEVEL 1;  Surgeon: Raynelle Bring, MD;  Location: [facility];  Service: Urology;   Laterality: N/A;   SCLERAL BUCKLE WITH CRYO Right 08/19/2013   Procedure: SCLERAL BUCKLE WITH CRYOPEXY;  Surgeon: Hurman Horn, MD;  Location: [facility];  Service: Ophthalmology;  Laterality: Right;   TONSILLECTOMY AND ADENOIDECTOMY  chiuld    Social History   Socioeconomic History   Marital status: Married    Spouse name: Not on file   Number of children: 2   Years of education: Not on file   Highest education level: Not on file  Occupational  History   Occupation: physician  Tobacco Use   Smoking status: Never   Smokeless tobacco: Never  Vaping Use   Vaping Use: Never used  Substance and Sexual Activity   Alcohol use: Yes    Comment: 1-2 wine per night   Drug use: Never   Sexual activity: Not on file  Other Topics Concern   Not on file  Social History Narrative   Not on file   Social Determinants of Health   Financial Resource Strain: Not on file  Food Insecurity: Not on file  Transportation Needs: Not on file  Physical Activity: Not on file  Stress: Not on file  Social Connections: Not on file  Intimate Partner Violence: Not on file    Family History  Problem Relation Age of Onset   Kidney Stones Mother    Dementia Father    Bowel Disease Father        ischemic bowl disease   Colon cancer Neg Hx    Colon polyps Neg Hx    Esophageal cancer Neg Hx    Rectal cancer Neg Hx    Stomach cancer Neg Hx     ROS: no fevers or chills, productive cough, hemoptysis, dysphasia, odynophagia, melena, hematochezia, dysuria, hematuria, rash, seizure activity, orthopnea, PND, pedal edema, claudication. Remaining systems are negative.  Physical Exam:   Blood pressure 110/78, pulse 65, height 6' (1.829 m), weight 182 lb (82.6 kg).  General:  Well developed/well nourished in NAD Skin warm/dry Patient not depressed No peripheral clubbing Back-normal HEENT-normal/normal eyelids Neck supple/normal carotid upstroke bilaterally; no bruits; no JVD; no thyromegaly chest - CTA/  normal expansion CV - RRR/normal S1 and S2; no murmurs, rubs or gallops;  PMI nondisplaced Abdomen -NT/ND, no HSM, no mass, + bowel sounds, no bruit 2+ femoral pulses, no bruits Ext-no edema, chords, 2+ DP Neuro-grossly nonfocal  ECG -normal sinus rhythm at a rate of 65, nonspecific ST changes.  Personally reviewed  A/P  1 chest pain-symptoms somewhat concerning for angina.  Also with history of diabetes for approximately 20 years.  Also with history of hyperlipidemia.  We will plan cardiac CTA to further assess.  Add aspirin 81 mg daily.  Continue statin.  2 hyperlipidemia-given diabetes mellitus (coronary artery disease equivalent) and history of elevated calcium score I would like to be aggressive with his lipids.  We will discontinue Lipitor and treat with Crestor 40 mg daily.  Check lipids and liver in 8 weeks.  3 CAD-he relates he has had an elevated calcium score previously.  As outlined above we will treat with aspirin and statin.  4 preoperative evaluation prior to knee replacement-we will await above cardiac studies before making recommendations.  Kirk Ruths, MD

## 2022-02-04 DIAGNOSIS — D696 Thrombocytopenia, unspecified: Secondary | ICD-10-CM | POA: Diagnosis not present

## 2022-02-07 ENCOUNTER — Ambulatory Visit: Payer: Medicare Other | Admitting: Cardiology

## 2022-02-07 ENCOUNTER — Other Ambulatory Visit (HOSPITAL_COMMUNITY): Payer: Self-pay

## 2022-02-07 ENCOUNTER — Encounter: Payer: Self-pay | Admitting: Cardiology

## 2022-02-07 VITALS — BP 110/78 | HR 65 | Ht 72.0 in | Wt 182.0 lb

## 2022-02-07 DIAGNOSIS — I2510 Atherosclerotic heart disease of native coronary artery without angina pectoris: Secondary | ICD-10-CM | POA: Diagnosis not present

## 2022-02-07 DIAGNOSIS — R072 Precordial pain: Secondary | ICD-10-CM

## 2022-02-07 DIAGNOSIS — E7800 Pure hypercholesterolemia, unspecified: Secondary | ICD-10-CM | POA: Diagnosis not present

## 2022-02-07 MED ORDER — TYPHOID VACCINE PO CPDR
1.0000 | DELAYED_RELEASE_CAPSULE | ORAL | 0 refills | Status: DC
  Filled 2022-02-07: qty 4, 7d supply, fill #0

## 2022-02-07 MED ORDER — ATOVAQUONE-PROGUANIL HCL 250-100 MG PO TABS
1.0000 | ORAL_TABLET | Freq: Every day | ORAL | 0 refills | Status: DC
  Filled 2022-02-07: qty 17, 17d supply, fill #0

## 2022-02-07 MED ORDER — ROSUVASTATIN CALCIUM 40 MG PO TABS
40.0000 mg | ORAL_TABLET | Freq: Every day | ORAL | 3 refills | Status: DC
  Filled 2022-02-07: qty 90, 90d supply, fill #0

## 2022-02-07 MED ORDER — METOPROLOL TARTRATE 50 MG PO TABS
50.0000 mg | ORAL_TABLET | ORAL | 0 refills | Status: DC
  Filled 2022-02-07: qty 1, 1d supply, fill #0

## 2022-02-07 MED ORDER — CIPROFLOXACIN HCL 500 MG PO TABS
500.0000 mg | ORAL_TABLET | Freq: Two times a day (BID) | ORAL | 0 refills | Status: DC
  Filled 2022-02-07: qty 6, 3d supply, fill #0

## 2022-02-07 NOTE — Patient Instructions (Addendum)
Medication Instructions:  Stop Atorvastatin Start Rosuvastatin 40 mg daily Start Aspirin 81 mg daily *If you need a refill on your cardiac medications before your next appointment, please call your pharmacy*   Lab Work: None needed   Testing/Procedures: Coronary CT  will be scheduled after approved by insurance  Follow instructions below   Follow-Up: At [facility], you and your health needs are our priority.  As part of our continuing mission to provide you with exceptional heart care, we have created designated Provider Care Teams.  These Care Teams include your primary Cardiologist (physician) and Advanced Practice Providers (APPs -  Physician Assistants and Nurse Practitioners) who all work together to provide you with the care you need, when you need it.  We recommend signing up for the patient portal called "MyChart".  Sign up information is provided on this After Visit Summary.  MyChart is used to connect with patients for Virtual Visits (Telemedicine).  Patients are able to view lab/test results, encounter notes, upcoming appointments, etc.  Non-urgent messages can be sent to your provider as well.   To learn more about what you can do with MyChart, go to [email or web address].      Your next appointment:  6 months   Call in Oct to schedule Feb appointment     The format for your next appointment: Office   Provider:  Dr.Crenshaw      Your cardiac CT will be scheduled at one of the below locations:   [facility] [address] [place], [place] [address] [phone]  [place] [address] [place], [place] [address] [phone]  If scheduled at [facility], please arrive at the [facility] and Children's Entrance (Entrance C2) of [facility] 30 minutes prior to test start time. You can use the FREE valet parking offered at entrance C (encouraged to control the heart rate for  the test)  Proceed to the [facility] Radiology Department (first floor) to check-in and test prep.  All radiology patients and guests should use entrance C2 at [facility], accessed from [facility], even though the hospital's physical address listed is [address]    If scheduled at [facility], please arrive 15 mins early for check-in and test prep.  Please follow these instructions carefully (unless otherwise directed):  Hold all erectile dysfunction medications at least 3 days (72 hrs) prior to test.  On the Night Before the Test: Be sure to Drink plenty of water. Do not consume any caffeinated/decaffeinated beverages or chocolate 12 hours prior to your test. Do not take any antihistamines 12 hours prior to your test.   On the Day of the Test: Drink plenty of water until 1 hour prior to the test. Do not eat any food 4 hours prior to the test. You may take your regular medications prior to the test.  Take metoprolol 50 mg two hours prior to test.         After the Test: Drink plenty of water. After receiving IV contrast, you may experience a mild flushed feeling. This is normal. On occasion, you may experience a mild rash up to 24 hours after the test. This is not dangerous. If this occurs, you can take Benadryl 25 mg and increase your fluid intake. If you experience trouble breathing, this can be serious. If it is severe call 911 IMMEDIATELY. If it is mild, please call our office. If  you take any of these medications: Glipizide/Metformin, Avandament, Glucavance, please do not take 48 hours after completing test unless otherwise instructed.  We will call to schedule your test 2-4 weeks out understanding that some insurance companies will need an authorization prior to the service being performed.   For non-scheduling related questions, please contact the cardiac imaging nurse navigator should you have any  questions/concerns: Marchia Bond, Cardiac Imaging Nurse Navigator Gordy Clement, Cardiac Imaging Nurse Navigator [facility] Direct Office Dial: [phone]   For scheduling needs, including cancellations and rescheduling, please call [place], [phone].    Important Information About Sugar

## 2022-02-08 ENCOUNTER — Other Ambulatory Visit (HOSPITAL_COMMUNITY): Payer: Self-pay

## 2022-03-01 DIAGNOSIS — F329 Major depressive disorder, single episode, unspecified: Secondary | ICD-10-CM | POA: Diagnosis not present

## 2022-03-02 ENCOUNTER — Telehealth (HOSPITAL_COMMUNITY): Payer: Self-pay | Admitting: *Deleted

## 2022-03-02 NOTE — Telephone Encounter (Signed)
Reaching out to patient to offer assistance regarding upcoming cardiac imaging study; pt verbalizes understanding of appt date/time. Patient request that I speak with his wife regarding cost and instructions.  Gordy Clement RN Navigator Cardiac Imaging [facility] [phone] office [phone] cell  Patient's wife aware patient is to arrive at 3pm and to take '50mg'$  metoprolol tartrate to achieve a HR of 55-65bpm for the test.

## 2022-03-03 ENCOUNTER — Ambulatory Visit (HOSPITAL_COMMUNITY)
Admission: RE | Admit: 2022-03-03 | Discharge: 2022-03-03 | Disposition: A | Discharge status: Home / Self Care | Payer: Medicare Other | Source: Ambulatory Visit | Attending: Cardiology | Admitting: Cardiology

## 2022-03-03 DIAGNOSIS — R931 Abnormal findings on diagnostic imaging of heart and coronary circulation: Secondary | ICD-10-CM | POA: Insufficient documentation

## 2022-03-03 DIAGNOSIS — R072 Precordial pain: Secondary | ICD-10-CM | POA: Diagnosis not present

## 2022-03-03 DIAGNOSIS — I2510 Atherosclerotic heart disease of native coronary artery without angina pectoris: Secondary | ICD-10-CM

## 2022-03-03 MED ORDER — IOHEXOL 350 MG/ML SOLN
100.0000 mL | Freq: Once | INTRAVENOUS | Status: AC | PRN
  Administered 2022-03-03: 100 mL via INTRAVENOUS

## 2022-03-03 MED ORDER — NITROGLYCERIN 0.4 MG SL SUBL
0.8000 mg | SUBLINGUAL_TABLET | Freq: Once | SUBLINGUAL | Status: AC
  Administered 2022-03-03: 0.8 mg via SUBLINGUAL

## 2022-03-03 MED ORDER — NITROGLYCERIN 0.4 MG SL SUBL
SUBLINGUAL_TABLET | SUBLINGUAL | Status: AC
  Filled 2022-03-03: qty 2

## 2022-03-04 ENCOUNTER — Other Ambulatory Visit: Payer: Self-pay | Admitting: *Deleted

## 2022-03-04 ENCOUNTER — Ambulatory Visit (HOSPITAL_BASED_OUTPATIENT_CLINIC_OR_DEPARTMENT_OTHER)
Admission: RE | Admit: 2022-03-04 | Discharge: 2022-03-04 | Disposition: A | Discharge status: Home / Self Care | Payer: Medicare Other | Source: Ambulatory Visit | Attending: Cardiology | Admitting: Cardiology

## 2022-03-04 ENCOUNTER — Ambulatory Visit (HOSPITAL_COMMUNITY)
Admission: RE | Admit: 2022-03-04 | Discharge: 2022-03-04 | Disposition: A | Discharge status: Home / Self Care | Payer: Medicare Other | Source: Ambulatory Visit | Attending: Cardiology | Admitting: Cardiology

## 2022-03-04 ENCOUNTER — Other Ambulatory Visit (HOSPITAL_COMMUNITY): Payer: Self-pay

## 2022-03-04 ENCOUNTER — Other Ambulatory Visit (HOSPITAL_COMMUNITY): Payer: Self-pay | Admitting: Emergency Medicine

## 2022-03-04 DIAGNOSIS — R931 Abnormal findings on diagnostic imaging of heart and coronary circulation: Secondary | ICD-10-CM

## 2022-03-04 DIAGNOSIS — E7800 Pure hypercholesterolemia, unspecified: Secondary | ICD-10-CM

## 2022-03-04 DIAGNOSIS — R072 Precordial pain: Secondary | ICD-10-CM | POA: Diagnosis not present

## 2022-03-04 LAB — POCT I-STAT CREATININE: Creatinine, Ser: 1 mg/dL (ref 0.61–1.24)

## 2022-03-04 MED ORDER — ROSUVASTATIN CALCIUM 40 MG PO TABS
40.0000 mg | ORAL_TABLET | Freq: Every day | ORAL | 3 refills | Status: DC
  Filled 2022-03-04: qty 90, 90d supply, fill #0

## 2022-03-07 ENCOUNTER — Other Ambulatory Visit (HOSPITAL_COMMUNITY): Payer: Self-pay

## 2022-03-07 MED ORDER — LAMOTRIGINE 100 MG PO TABS
100.0000 mg | ORAL_TABLET | Freq: Every day | ORAL | 5 refills | Status: DC
  Filled 2022-03-07: qty 30, 30d supply, fill #0
  Filled 2022-04-26: qty 30, 30d supply, fill #1
  Filled 2022-05-20: qty 30, 30d supply, fill #2
  Filled 2022-06-26: qty 30, 30d supply, fill #3
  Filled 2022-07-29: qty 30, 30d supply, fill #4
  Filled 2022-08-29: qty 30, 30d supply, fill #5

## 2022-03-07 MED ORDER — LAMOTRIGINE 25 MG PO TABS
ORAL_TABLET | ORAL | 0 refills | Status: DC
  Filled 2022-03-07: qty 42, 28d supply, fill #0

## 2022-03-18 DIAGNOSIS — M1712 Unilateral primary osteoarthritis, left knee: Secondary | ICD-10-CM | POA: Diagnosis not present

## 2022-03-21 ENCOUNTER — Telehealth: Payer: Self-pay | Admitting: *Deleted

## 2022-03-21 NOTE — Telephone Encounter (Signed)
   Pre-operative Risk Assessment    Patient Name: Timothy [name]  DOB: [birth year] MRN: [id number]      Request for Surgical Clearance    Procedure:  left total knee arthroplasty  Date of Surgery:  Clearance 04/11/22                                 Surgeon:  Dr Paralee Cancel Surgeon's Group or Practice Name:  [facility] Phone number:  [phone] Fax number:  [phone]   Type of Clearance Requested:   - Medical    Type of Anesthesia:  Spinal   Additional requests/questions:    SignedFredia Beets   03/21/2022, 1:47 PM

## 2022-03-22 NOTE — Telephone Encounter (Signed)
   Patient Name: Timothy [name]  DOB: Jun 17, 1956 MRN: [id number]  Primary Cardiologist: None  Chart reviewed as part of pre-operative protocol coverage. Given past medical history and time since last visit, based on ACC/AHA guidelines, Timothy [name] would be at acceptable risk for the planned procedure without further cardiovascular testing.   Last seen 02/07/22 for chest pain. Cardiac CTA 03/03/22 coronary calcium score 314 placing him in 73rd percentile with nonobstructive coronary disease and FFR negative. Recommended for medical management: aspirin, rosuvastatin  May hold aspirin 7 days prior to procedure if deemed necessary from surgical perspective.  MyChart message sent to patient to make him aware.   The patient was advised that if he develops new symptoms prior to surgery to contact our office to arrange for a follow-up visit, and he verbalized understanding.  I will route this recommendation to the requesting party via Epic fax function and remove from pre-op pool.  Please call with questions.  Loel Dubonnet, NP 03/22/2022, 1:57 PM

## 2022-04-04 ENCOUNTER — Other Ambulatory Visit (HOSPITAL_COMMUNITY): Payer: Self-pay

## 2022-04-04 DIAGNOSIS — E7800 Pure hypercholesterolemia, unspecified: Secondary | ICD-10-CM | POA: Diagnosis not present

## 2022-04-04 LAB — HEPATIC FUNCTION PANEL
ALT: 89 IU/L — ABNORMAL HIGH (ref 0–44)
AST: 62 IU/L — ABNORMAL HIGH (ref 0–40)
Albumin: 4.7 g/dL (ref 3.9–4.9)
Alkaline Phosphatase: 78 IU/L (ref 44–121)
Bilirubin Total: 0.6 mg/dL (ref 0.0–1.2)
Bilirubin, Direct: 0.2 mg/dL (ref 0.00–0.40)
Total Protein: 6.3 g/dL (ref 6.0–8.5)

## 2022-04-04 LAB — LIPID PANEL
Chol/HDL Ratio: 2.5 ratio (ref 0.0–5.0)
Cholesterol, Total: 109 mg/dL (ref 100–199)
HDL: 43 mg/dL (ref >39)
LDL Chol Calc (NIH): 56 mg/dL (ref 0–99)
Triglycerides: 36 mg/dL (ref 0–149)
VLDL Cholesterol Cal: 10 mg/dL (ref 5–40)

## 2022-04-05 ENCOUNTER — Other Ambulatory Visit (HOSPITAL_COMMUNITY): Payer: Self-pay

## 2022-04-05 ENCOUNTER — Telehealth: Payer: Self-pay | Admitting: *Deleted

## 2022-04-05 DIAGNOSIS — E7800 Pure hypercholesterolemia, unspecified: Secondary | ICD-10-CM

## 2022-04-05 MED ORDER — EZETIMIBE 10 MG PO TABS
10.0000 mg | ORAL_TABLET | Freq: Every day | ORAL | 3 refills | Status: DC
  Filled 2022-04-05: qty 90, 90d supply, fill #0
  Filled 2022-06-26: qty 90, 90d supply, fill #1
  Filled 2022-09-29: qty 90, 90d supply, fill #2
  Filled 2022-12-27: qty 90, 90d supply, fill #3

## 2022-04-05 MED ORDER — ROSUVASTATIN CALCIUM 20 MG PO TABS: 20.0000 mg | ORAL_TABLET | Freq: Every day | ORAL | Status: DC

## 2022-04-05 NOTE — Telephone Encounter (Signed)
-----   Message from Lelon Perla, MD sent at 04/04/2022  6:24 PM EDT ----- LFTs mildly elevated; decrease crestor to 20 mg daily; add zetia 10 mg daily; lipids and liver 8 weeks Kirk Ruths

## 2022-04-05 NOTE — Telephone Encounter (Signed)
Message sent to patient via my chart New script sent to the pharmacy  Lab orders mailed to the pt

## 2022-04-11 ENCOUNTER — Other Ambulatory Visit (HOSPITAL_COMMUNITY): Payer: Self-pay

## 2022-04-11 DIAGNOSIS — M1712 Unilateral primary osteoarthritis, left knee: Secondary | ICD-10-CM | POA: Diagnosis not present

## 2022-04-11 DIAGNOSIS — Z96652 Presence of left artificial knee joint: Secondary | ICD-10-CM | POA: Diagnosis not present

## 2022-04-11 MED ORDER — CELECOXIB 200 MG PO CAPS
200.0000 mg | ORAL_CAPSULE | Freq: Two times a day (BID) | ORAL | 0 refills | Status: DC
Start: 2022-04-09 — End: 2022-09-16
  Filled 2022-04-11: qty 60, 30d supply, fill #0

## 2022-04-11 MED ORDER — METHOCARBAMOL 500 MG PO TABS
500.0000 mg | ORAL_TABLET | Freq: Four times a day (QID) | ORAL | 0 refills | Status: DC | PRN
  Filled 2022-04-11: qty 40, 10d supply, fill #0

## 2022-04-11 MED ORDER — OXYCODONE HCL 5 MG PO TABS
5.0000 mg | ORAL_TABLET | ORAL | 0 refills | Status: DC | PRN
  Filled 2022-04-11: qty 42, 7d supply, fill #0

## 2022-04-11 MED ORDER — ASPIRIN 81 MG PO CHEW
81.0000 mg | CHEWABLE_TABLET | Freq: Two times a day (BID) | ORAL | 0 refills | Status: DC
  Filled 2022-04-11: qty 60, 30d supply, fill #0

## 2022-04-11 MED ORDER — DOCUSATE SODIUM 100 MG PO CAPS
100.0000 mg | ORAL_CAPSULE | Freq: Two times a day (BID) | ORAL | 0 refills | Status: DC
  Filled 2022-04-11: qty 28, 14d supply, fill #0

## 2022-04-11 MED ORDER — POLYETHYLENE GLYCOL 3350 17 G PO PACK
17.0000 g | PACK | Freq: Two times a day (BID) | ORAL | 0 refills | Status: DC
  Filled 2022-04-11: qty 28, 14d supply, fill #0

## 2022-04-14 DIAGNOSIS — M25562 Pain in left knee: Secondary | ICD-10-CM | POA: Diagnosis not present

## 2022-04-18 DIAGNOSIS — M25562 Pain in left knee: Secondary | ICD-10-CM | POA: Diagnosis not present

## 2022-04-19 ENCOUNTER — Other Ambulatory Visit (HOSPITAL_COMMUNITY): Payer: Self-pay

## 2022-04-19 MED ORDER — METHOCARBAMOL 500 MG PO TABS
500.0000 mg | ORAL_TABLET | Freq: Four times a day (QID) | ORAL | 1 refills | Status: DC | PRN
  Filled 2022-04-19: qty 40, 10d supply, fill #0

## 2022-04-20 DIAGNOSIS — M25562 Pain in left knee: Secondary | ICD-10-CM | POA: Diagnosis not present

## 2022-04-22 DIAGNOSIS — M25562 Pain in left knee: Secondary | ICD-10-CM | POA: Diagnosis not present

## 2022-04-25 DIAGNOSIS — M25562 Pain in left knee: Secondary | ICD-10-CM | POA: Diagnosis not present

## 2022-04-26 ENCOUNTER — Other Ambulatory Visit (HOSPITAL_COMMUNITY): Payer: Self-pay

## 2022-04-26 MED ORDER — OXYCODONE HCL 5 MG PO TABS
5.0000 mg | ORAL_TABLET | ORAL | 0 refills | Status: DC | PRN
  Filled 2022-04-26: qty 30, 5d supply, fill #0

## 2022-04-27 ENCOUNTER — Other Ambulatory Visit (HOSPITAL_COMMUNITY): Payer: Self-pay

## 2022-04-27 DIAGNOSIS — M25562 Pain in left knee: Secondary | ICD-10-CM | POA: Diagnosis not present

## 2022-04-27 MED ORDER — TIZANIDINE HCL 4 MG PO TABS
4.0000 mg | ORAL_TABLET | Freq: Every evening | ORAL | 0 refills | Status: DC | PRN
  Filled 2022-04-27: qty 30, 30d supply, fill #0

## 2022-05-02 DIAGNOSIS — M25562 Pain in left knee: Secondary | ICD-10-CM | POA: Diagnosis not present

## 2022-05-04 ENCOUNTER — Other Ambulatory Visit: Payer: Self-pay | Admitting: *Deleted

## 2022-05-04 ENCOUNTER — Other Ambulatory Visit (HOSPITAL_COMMUNITY): Payer: Self-pay

## 2022-05-04 ENCOUNTER — Encounter: Payer: Self-pay | Admitting: *Deleted

## 2022-05-04 DIAGNOSIS — I2510 Atherosclerotic heart disease of native coronary artery without angina pectoris: Secondary | ICD-10-CM

## 2022-05-05 ENCOUNTER — Other Ambulatory Visit: Payer: Self-pay | Admitting: *Deleted

## 2022-05-05 DIAGNOSIS — E7800 Pure hypercholesterolemia, unspecified: Secondary | ICD-10-CM

## 2022-05-05 DIAGNOSIS — M25562 Pain in left knee: Secondary | ICD-10-CM | POA: Diagnosis not present

## 2022-05-05 DIAGNOSIS — I2510 Atherosclerotic heart disease of native coronary artery without angina pectoris: Secondary | ICD-10-CM | POA: Diagnosis not present

## 2022-05-05 LAB — LIPID PANEL
Chol/HDL Ratio: 2.6 ratio (ref 0.0–5.0)
Cholesterol, Total: 105 mg/dL (ref 100–199)
HDL: 41 mg/dL (ref >39)
LDL Chol Calc (NIH): 48 mg/dL (ref 0–99)
Triglycerides: 80 mg/dL (ref 0–149)
VLDL Cholesterol Cal: 16 mg/dL (ref 5–40)

## 2022-05-05 LAB — BASIC METABOLIC PANEL
BUN/Creatinine Ratio: 17 (ref 10–24)
BUN: 19 mg/dL (ref 8–27)
CO2: 26 mmol/L (ref 20–29)
Calcium: 11.7 mg/dL — ABNORMAL HIGH (ref 8.6–10.2)
Chloride: 101 mmol/L (ref 96–106)
Creatinine, Ser: 1.12 mg/dL (ref 0.76–1.27)
Glucose: 97 mg/dL (ref 70–99)
Potassium: 4.4 mmol/L (ref 3.5–5.2)
Sodium: 140 mmol/L (ref 134–144)
eGFR: 72 mL/min/{1.73_m2} (ref >59)

## 2022-05-05 LAB — HEPATIC FUNCTION PANEL
ALT: 76 IU/L — ABNORMAL HIGH (ref 0–44)
AST: 44 IU/L — ABNORMAL HIGH (ref 0–40)
Albumin: 4.9 g/dL (ref 3.9–4.9)
Alkaline Phosphatase: 98 IU/L (ref 44–121)
Bilirubin Total: 0.5 mg/dL (ref 0.0–1.2)
Bilirubin, Direct: 0.2 mg/dL (ref 0.00–0.40)
Total Protein: 6.9 g/dL (ref 6.0–8.5)

## 2022-05-07 ENCOUNTER — Other Ambulatory Visit: Payer: Self-pay | Admitting: Cardiology

## 2022-05-07 DIAGNOSIS — E7800 Pure hypercholesterolemia, unspecified: Secondary | ICD-10-CM

## 2022-05-09 ENCOUNTER — Other Ambulatory Visit (HOSPITAL_COMMUNITY): Payer: Self-pay

## 2022-05-09 DIAGNOSIS — M25562 Pain in left knee: Secondary | ICD-10-CM | POA: Diagnosis not present

## 2022-05-09 MED ORDER — ROSUVASTATIN CALCIUM 20 MG PO TABS
20.0000 mg | ORAL_TABLET | Freq: Every day | ORAL | 3 refills | Status: DC
  Filled 2022-05-09: qty 90, 90d supply, fill #0
  Filled 2022-08-29: qty 90, 90d supply, fill #1
  Filled 2022-11-23: qty 90, 90d supply, fill #2
  Filled 2023-02-22 (×2): qty 90, 90d supply, fill #3

## 2022-05-10 ENCOUNTER — Other Ambulatory Visit (HOSPITAL_COMMUNITY): Payer: Self-pay

## 2022-05-10 MED ORDER — TIZANIDINE HCL 4 MG PO TABS
4.0000 mg | ORAL_TABLET | Freq: Two times a day (BID) | ORAL | 1 refills | Status: DC | PRN
  Filled 2022-05-10: qty 30, 15d supply, fill #0

## 2022-05-12 DIAGNOSIS — M25562 Pain in left knee: Secondary | ICD-10-CM | POA: Diagnosis not present

## 2022-05-16 DIAGNOSIS — M25562 Pain in left knee: Secondary | ICD-10-CM | POA: Diagnosis not present

## 2022-05-18 DIAGNOSIS — M25562 Pain in left knee: Secondary | ICD-10-CM | POA: Diagnosis not present

## 2022-05-20 ENCOUNTER — Other Ambulatory Visit (HOSPITAL_COMMUNITY): Payer: Self-pay

## 2022-05-23 ENCOUNTER — Other Ambulatory Visit (HOSPITAL_COMMUNITY): Payer: Self-pay

## 2022-05-23 DIAGNOSIS — M25562 Pain in left knee: Secondary | ICD-10-CM | POA: Diagnosis not present

## 2022-05-27 ENCOUNTER — Other Ambulatory Visit (HOSPITAL_COMMUNITY): Payer: Self-pay

## 2022-05-27 DIAGNOSIS — Z96652 Presence of left artificial knee joint: Secondary | ICD-10-CM | POA: Diagnosis not present

## 2022-05-27 DIAGNOSIS — Z471 Aftercare following joint replacement surgery: Secondary | ICD-10-CM | POA: Diagnosis not present

## 2022-05-27 MED ORDER — AMOXICILLIN 500 MG PO CAPS
1500.0000 mg | ORAL_CAPSULE | ORAL | 3 refills | Status: DC
  Filled 2022-05-27: qty 3, 1d supply, fill #0

## 2022-05-31 ENCOUNTER — Other Ambulatory Visit (HOSPITAL_COMMUNITY): Payer: Self-pay

## 2022-06-02 DIAGNOSIS — L821 Other seborrheic keratosis: Secondary | ICD-10-CM | POA: Diagnosis not present

## 2022-06-02 DIAGNOSIS — Z85828 Personal history of other malignant neoplasm of skin: Secondary | ICD-10-CM | POA: Diagnosis not present

## 2022-06-02 DIAGNOSIS — Z85820 Personal history of malignant melanoma of skin: Secondary | ICD-10-CM | POA: Diagnosis not present

## 2022-06-02 DIAGNOSIS — D1801 Hemangioma of skin and subcutaneous tissue: Secondary | ICD-10-CM | POA: Diagnosis not present

## 2022-06-26 ENCOUNTER — Other Ambulatory Visit (HOSPITAL_COMMUNITY): Payer: Self-pay

## 2022-06-28 ENCOUNTER — Other Ambulatory Visit: Payer: Self-pay

## 2022-07-04 DIAGNOSIS — R748 Abnormal levels of other serum enzymes: Secondary | ICD-10-CM | POA: Diagnosis not present

## 2022-07-04 DIAGNOSIS — E119 Type 2 diabetes mellitus without complications: Secondary | ICD-10-CM | POA: Diagnosis not present

## 2022-07-04 DIAGNOSIS — I2510 Atherosclerotic heart disease of native coronary artery without angina pectoris: Secondary | ICD-10-CM | POA: Diagnosis not present

## 2022-07-04 DIAGNOSIS — Z8546 Personal history of malignant neoplasm of prostate: Secondary | ICD-10-CM | POA: Diagnosis not present

## 2022-07-04 DIAGNOSIS — E7800 Pure hypercholesterolemia, unspecified: Secondary | ICD-10-CM | POA: Diagnosis not present

## 2022-07-07 DIAGNOSIS — H524 Presbyopia: Secondary | ICD-10-CM | POA: Diagnosis not present

## 2022-07-29 ENCOUNTER — Other Ambulatory Visit (HOSPITAL_COMMUNITY): Payer: Self-pay

## 2022-07-29 ENCOUNTER — Other Ambulatory Visit: Payer: Self-pay

## 2022-08-01 ENCOUNTER — Other Ambulatory Visit (HOSPITAL_COMMUNITY): Payer: Self-pay

## 2022-08-01 DIAGNOSIS — K080 Exfoliation of teeth due to systemic causes: Secondary | ICD-10-CM | POA: Diagnosis not present

## 2022-08-01 MED ORDER — TRULICITY 0.75 MG/0.5ML ~~LOC~~ SOAJ
0.7500 mg | SUBCUTANEOUS | 3 refills | Status: DC
  Filled 2022-08-01: qty 6, 84d supply, fill #0
  Filled 2022-10-10: qty 6, 84d supply, fill #1
  Filled 2023-01-12: qty 6, 84d supply, fill #2
  Filled 2023-04-07: qty 6, 84d supply, fill #3

## 2022-08-02 ENCOUNTER — Other Ambulatory Visit: Payer: Self-pay

## 2022-08-04 ENCOUNTER — Encounter (HOSPITAL_BASED_OUTPATIENT_CLINIC_OR_DEPARTMENT_OTHER): Payer: Self-pay | Admitting: Emergency Medicine

## 2022-08-04 ENCOUNTER — Emergency Department (HOSPITAL_BASED_OUTPATIENT_CLINIC_OR_DEPARTMENT_OTHER)
Admission: EM | Admit: 2022-08-04 | Discharge: 2022-08-04 | Disposition: A | Discharge status: Home / Self Care | Payer: Medicare Other | Attending: Emergency Medicine | Admitting: Emergency Medicine

## 2022-08-04 DIAGNOSIS — S0993XA Unspecified injury of face, initial encounter: Secondary | ICD-10-CM | POA: Diagnosis not present

## 2022-08-04 DIAGNOSIS — S01512A Laceration without foreign body of oral cavity, initial encounter: Secondary | ICD-10-CM | POA: Insufficient documentation

## 2022-08-04 DIAGNOSIS — E119 Type 2 diabetes mellitus without complications: Secondary | ICD-10-CM | POA: Diagnosis not present

## 2022-08-04 DIAGNOSIS — Z7984 Long term (current) use of oral hypoglycemic drugs: Secondary | ICD-10-CM | POA: Diagnosis not present

## 2022-08-04 DIAGNOSIS — Z7982 Long term (current) use of aspirin: Secondary | ICD-10-CM | POA: Diagnosis not present

## 2022-08-04 DIAGNOSIS — X58XXXA Exposure to other specified factors, initial encounter: Secondary | ICD-10-CM | POA: Insufficient documentation

## 2022-08-04 DIAGNOSIS — Z794 Long term (current) use of insulin: Secondary | ICD-10-CM | POA: Insufficient documentation

## 2022-08-04 HISTORY — DX: Atherosclerotic heart disease of native coronary artery without angina pectoris: I25.10

## 2022-08-04 NOTE — ED Provider Notes (Signed)
Timothy [name] Provider Note   CSN: [id number] Arrival date & time: 08/04/22  1940     History Chief Complaint  Patient presents with   Facial Laceration    BURFORD WHEATLEY is a 67 y.o. male with h/o DM presents to the ER for evaluation of his bleeding tongue. The patient reports that around 1830 he was eating a salad and bit the tip of his tongue. He reports that it kept bleeding for 15 minutes so he decided to come to the ER. After being in the waiting room, he reports it has stopped bleeding. Takes a baby ASA, but no anticoagulant use.   HPI     Home Medications Prior to Admission medications   Medication Sig Start Date End Date Taking? Authorizing Provider  acetaminophen (TYLENOL) 500 MG tablet Take 500 mg by mouth every 6 (six) hours as needed.    [provider]  amoxicillin (AMOXIL) 500 MG capsule Take 3 capsules (1,500 mg total) by mouth 1 hours prior to dental procedure 05/27/22     aspirin 81 MG chewable tablet Chew 1 tablet (81 mg total) by mouth 2 (two) times daily. 04/09/22     aspirin EC 81 MG tablet Take 1 tablet (81 mg total) by mouth daily. Swallow whole. 02/07/22   Lelon Perla, MD  atovaquone-proguanil (MALARONE) 250-100 MG TABS tablet Take 1 tablet by mouth daily starting 2 days prior to trip, take daily during stay, & continue for 1 week after return. 02/06/22     Azelastine HCl 0.15 % SOLN SMARTSIG:1-2 Spray(s) Both Nares Twice Daily PRN 03/23/20   [provider]  Azelastine HCl 0.15 % SOLN INSTILL 1 TO 2 SPRAYS IN EACH NOSTRIL TWO TIMES DAILY AS NEEDED 11/08/19 11/07/20  Lavone Orn, MD  Azelastine HCl 0.15 % SOLN Place 1-2 sprays into the nose 2 (two) times daily as needed. 12/21/20     blood glucose meter kit and supplies Use to check blood sugar 1 to 2 times daily 12/15/21     celecoxib (CELEBREX) 200 MG capsule Take 1 capsule (200 mg total) by mouth 2 (two) times daily with a meal. 04/09/22      cetirizine (ZYRTEC) 10 MG tablet Take 10 mg by mouth daily.    [provider]  ciprofloxacin (CIPRO) 500 MG tablet Take 1 tablet (500 mg total) by mouth every 12 (twelve) hours. 02/06/22     docusate sodium (COLACE) 100 MG capsule Take 1 capsule (100 mg total) by mouth 2 (two) times daily. 04/09/22     Dulaglutide (TRULICITY) A999333 0000000 SOPN Inject 0.75 mg into the skin once a week. 10/19/20     Dulaglutide (TRULICITY) A999333 0000000 SOPN Inject 0.75 mg into the skin once a week. 01/25/22     Dulaglutide (TRULICITY) A999333 0000000 SOPN Inject 0.75 mg into the skin once a week. 08/01/22     ezetimibe (ZETIA) 10 MG tablet Take 1 tablet (10 mg total) by mouth daily. 04/05/22   Lelon Perla, MD  famotidine (PEPCID) 20 MG tablet Take 10 mg by mouth daily as needed for heartburn or indigestion.     [provider]  fluticasone (FLONASE) 50 MCG/ACT nasal spray Place 2 sprays into both nostrils daily.    [provider]  glucose blood test strip 1 each daily as needed.    [provider]  glucose blood test strip Use to test blood sugar 1 to 2 times per day 12/15/21  lamoTRIgine (LAMICTAL) 100 MG tablet Take 1 tablet (100 mg total) by mouth daily. 03/05/22     lamoTRIgine (LAMICTAL) 25 MG tablet Take 1 tablet (25 mg total) by mouth daily for 14 days, THEN 2 tablets (50 mg total) daily for 14 days. 03/05/22 04/04/22    Lancets (FREESTYLE) lancets 1 each daily as needed.    [provider]  Lancets [facility] DELICA PLUS [id number]) MISC Use to test blood sugar 1 to 2 times per day 12/15/21     metFORMIN (GLUCOPHAGE) 500 MG tablet Take 500 mg by mouth 2 (two) times daily with a meal.     [provider]  metFORMIN (GLUCOPHAGE) 500 MG tablet Take 2 tablets (1,000 mg total) by mouth daily. 01/25/22     methocarbamol (ROBAXIN) 500 MG tablet Take 1 tablet (500 mg total) by mouth every 6 (six) hours as needed for muscle spasm. 04/19/22     methylphenidate (RITALIN)  5 MG tablet Take 1 tablet (5 mg total) by mouth daily. 08/03/21     methylphenidate (RITALIN) 5 MG tablet Take 1 tablet (5 mg total) by mouth daily. 08/30/21     metoprolol tartrate (LOPRESSOR) 50 MG tablet Take 1 tablet (50 mg total) by mouth 2 hours before coronary CT. 02/07/22   Lelon Perla, MD  Multiple Vitamins-Minerals (CENTRUM SILVER 50+MEN) TABS Take 1 tablet by mouth daily.    [provider]  Omega-3 Fatty Acids (OMEGA-3 FISH OIL PO) Take 1 capsule by mouth daily.    [provider]  oxyCODONE (OXY IR/ROXICODONE) 5 MG immediate release tablet Take 1 tablet by mouth every 4 hours as needed for severe pain 04/25/22     polyethylene glycol (MIRALAX) 17 g packet Take 17 g by mouth 2 (two) times daily for 14 days 04/09/22     rosuvastatin (CRESTOR) 20 MG tablet Take 1 tablet (20 mg total) by mouth daily. 05/09/22   Lelon Perla, MD  tiZANidine (ZANAFLEX) 4 MG tablet Take 1 tablet (4 mg total) by mouth 2 (two) times daily as needed for muscle spasm/pain [id number]     TRULICITY A999333 0000000 SOPN Inject 0.75 mg into the skin once a week. Monday's 08/22/17   [provider]  typhoid (VIVOTIF) DR capsule Take 1 capsule by mouth 1 hour before a meal with a cold or luke-warm drink on days 1, 3, 5, and 7, for 4 doses. Complete 1 week before exposure. 02/07/22         Allergies    Sporanox [itraconazole] and Ace inhibitors    Review of Systems   Review of Systems  HENT:         Reports tongue laceration    Physical Exam Updated Vital Signs BP 126/72 (BP Location: Left Arm)   Pulse 68   Temp 97.8 F (36.6 C)   Resp 18   SpO2 96%  Physical Exam Vitals and nursing note reviewed.  Constitutional:      Appearance: Normal appearance.  HENT:     Mouth/Throat:     Mouth: Mucous membranes are moist.     Comments: <2m laceration seen to the tip of the tongue. Bleeding is controlled. There is a small skin flap, shallow. Not through the tongue. Normal speech.  Controlling secretions.  Eyes:     General: No scleral icterus. Pulmonary:     Effort: Pulmonary effort is normal. No respiratory distress.  Skin:    General: Skin is warm and dry.  Neurological:  General: No focal deficit present.     Mental Status: He is alert. Mental status is at baseline.  Psychiatric:        Mood and Affect: Mood normal.       ED Results / Procedures / Treatments   Labs (all labs ordered are listed, but only abnormal results are displayed) Labs Reviewed - No data to display  EKG None  Radiology No results found.  Procedures Procedures   Medications Ordered in ED Medications - No data to display  ED Course/ Medical Decision Making/ A&P   {                            Medical Decision Making  67 year old male presents emerged part today for evaluation of tongue laceration.  Differential diagnose includes is not limited to laceration versus abrasion versus tongue amputation.  Vital signs are unremarkable.  Physical exam as noted above.  Please see image.  On examination, the laceration is smaller than 2 mm.  Bleeding is controlled.  He has normal speech.  There is a small skin flap however is superficial and shallow.  After showing my attending, I do not think any suturing is needed at this time.  Believe it would be more harm than benefit.  Will recommend saline rinses throughout the day and will after eating.  We discussed return precautions and red flag symptoms.  Patient verbalizes understanding and agrees to the plan.  Patient is stable be discharged home in good condition.  I discussed this case with my attending physician who cosigned this note including patient's presenting symptoms, physical exam, and planned diagnostics and interventions. Attending physician stated agreement with plan or made changes to plan which were implemented.   Final Clinical Impression(s) / ED Diagnoses Final diagnoses:  Tongue laceration, initial encounter     Rx / DC Orders ED Discharge Orders     None         Sherrell Puller, Hershal Coria 08/04/22 2114    Fransico Meadow, MD 08/06/22 [id number]

## 2022-08-04 NOTE — ED Triage Notes (Signed)
Bit tongue. Tip. Unable to bleeding. Happened around 7pm Slowed with ice but has not stopped Takes daily asa 81

## 2022-08-04 NOTE — Discharge Instructions (Addendum)
You were seen in the ER today for evaluation of your tongue bleed.  I do not think any laceration repair is needed as it would cause more harm than benefit.  I would like for you to use saline rinses after eating and you can use them multiple times a day.  Make sure you gargle and do not swallow this water.  Additionally, I would avoid any citrusy, spicy, or salty foods.  You can apply Orajel to the tip of your tongue if it causes any pain.  If you start getting abnormal discharge from the wound, fevers, tongue swelling, worsening pain, please make sure to return to the nearest emergency room for evaluation.  Contact a doctor if: Medicine does not help your pain. You have a fever or chills. You have redness, swelling, or pain on your wound that is getting worse. You have fresh bleeding or pus coming from your wound. You have swollen or tender glands in your throat. Get help right away if: The edges of your wound break open. Your face or the area under your jaw gets swollen. You have trouble breathing or swallowing. These symptoms may be an emergency. Get help right away. Call 911. Do not wait to see if the symptoms will go away. Do not drive yourself to the hospital.

## 2022-08-08 ENCOUNTER — Other Ambulatory Visit: Payer: Self-pay

## 2022-08-08 DIAGNOSIS — H524 Presbyopia: Secondary | ICD-10-CM | POA: Diagnosis not present

## 2022-08-23 ENCOUNTER — Encounter: Payer: Self-pay | Admitting: Cardiology

## 2022-08-30 ENCOUNTER — Other Ambulatory Visit: Payer: Self-pay

## 2022-09-05 ENCOUNTER — Ambulatory Visit: Payer: Medicare Other | Admitting: Cardiology

## 2022-09-06 DIAGNOSIS — R4189 Other symptoms and signs involving cognitive functions and awareness: Secondary | ICD-10-CM | POA: Diagnosis not present

## 2022-09-06 DIAGNOSIS — R454 Irritability and anger: Secondary | ICD-10-CM | POA: Diagnosis not present

## 2022-09-07 ENCOUNTER — Other Ambulatory Visit (HOSPITAL_COMMUNITY): Payer: Self-pay

## 2022-09-07 MED ORDER — LAMOTRIGINE 100 MG PO TABS
200.0000 mg | ORAL_TABLET | Freq: Every day | ORAL | 3 refills | Status: DC
  Filled 2022-09-07 – 2022-09-15 (×2): qty 180, 90d supply, fill #0
  Filled 2022-12-18: qty 180, 90d supply, fill #1
  Filled 2023-03-14: qty 180, 90d supply, fill #2

## 2022-09-12 NOTE — Progress Notes (Signed)
HPI: Follow-up coronary artery disease.  Coronary CTA September 2023 showed calcium score 315 which was 73rd percentile.  There was mild disease in the proximal and mid LAD, left circumflex and RCA.  FFR without significant stenosis.  Since last seen the patient denies any dyspnea on exertion, orthopnea, PND, pedal edema, palpitations, syncope or chest pain.   Current Outpatient Medications  Medication Sig Dispense Refill   acetaminophen (TYLENOL) 500 MG tablet Take 500 mg by mouth every 6 (six) hours as needed.     aspirin EC 81 MG tablet Take 1 tablet (81 mg total) by mouth daily. Swallow whole. 30 tablet 12   Azelastine HCl 0.15 % SOLN Place 1-2 sprays into the nose 2 (two) times daily as needed. 30 mL 11   blood glucose meter kit and supplies Use to check blood sugar 1 to 2 times daily 1 each 0   cetirizine (ZYRTEC) 10 MG tablet Take 10 mg by mouth daily.     Dulaglutide (TRULICITY) A999333 0000000 SOPN Inject 0.75 mg into the skin once a week. 6 mL 3   ezetimibe (ZETIA) 10 MG tablet Take 1 tablet (10 mg total) by mouth daily. 90 tablet 3   famotidine (PEPCID) 20 MG tablet Take 10 mg by mouth daily as needed for heartburn or indigestion.      fluticasone (FLONASE) 50 MCG/ACT nasal spray Place 2 sprays into both nostrils daily.     glucose blood test strip Use to test blood sugar 1 to 2 times per day 200 each 3   lamoTRIgine (LAMICTAL) 100 MG tablet Take 2 tablets (200 mg total) by mouth daily. 180 tablet 3   Lancets (ONETOUCH DELICA PLUS 123XX123) MISC Use to test blood sugar 1 to 2 times per day 200 each 3   metFORMIN (GLUCOPHAGE) 500 MG tablet Take 2 tablets (1,000 mg total) by mouth daily. 180 tablet 3   Multiple Vitamins-Minerals (CENTRUM SILVER 50+MEN) TABS Take 1 tablet by mouth daily.     Omega-3 Fatty Acids (OMEGA-3 FISH OIL PO) Take 1 capsule by mouth daily.     rosuvastatin (CRESTOR) 20 MG tablet Take 1 tablet (20 mg total) by mouth daily. 90 tablet 3   Current  Facility-Administered Medications  Medication Dose Route Frequency Provider Last Rate Last Admin   0.9 %  sodium chloride infusion  500 mL Intravenous Once Ladene Artist, MD         Past Medical History:  Diagnosis Date   Acute meniscal tear of knee    CAD (coronary artery disease)    Environmental allergies    GERD (gastroesophageal reflux disease)    History of basal cell carcinoma (BCC) excision    History of detached retina repair    left s/p laser 2019;  right s/p surgical repair 02/ 2015   History of malignant melanoma of skin    04-26-2019 per pt fall 2019 excision from back, localized, and no recurrence   History of prostate cancer urologist --- dr Alinda Money   dx 2016-- Stage T1c, Gleason 3+3;  05-11-2015 s/p  radical prostatectomy  (04-26-2019 per pt psa undetectable)   Hx of adenomatous colonic polyps 01/18/2017   Hyperlipidemia    Mild obstructive sleep apnea    no longer has to use CPAP wt. loss (pt retested 10-30-2014 epic)   OA (osteoarthritis)    Sleep apnea    oral appliance    Thrombocytopenia, unspecified ([place])    04-26-2019  per pt baseline plt count  150   Type 2 diabetes mellitus ([place])    followed by pcp  (04-26-2019 check's cbg daily,  fasting cbg-- 110-120;  per pt last A1c 5.8)   Wears glasses     Past Surgical History:  Procedure Laterality Date   CATARACT EXTRACTION W/ INTRAOCULAR LENS  IMPLANT, BILATERAL  01/2018   COLONOSCOPY  last one 01-12-2017   GAS INSERTION Right 08/19/2013   Procedure: INSERTION OF GAS;  Surgeon: Hurman Horn, MD;  Location: [facility];  Service: Ophthalmology;  Laterality: Right;  SF6   KNEE ARTHROSCOPY Left 05/02/2019   Procedure: ARTHROSCOPY KNEE evaluation under anesthesia debridement partial medial meniscectomy chondroplasty;  Surgeon: Sydnee Cabal, MD;  Location: [facility];  Service: Orthopedics;  Laterality: Left;  Anesthesia - Femoral nerve block/knee block/IV sedation   MASS EXCISION Left 07/08/2014    Procedure: EXCISION OF KERATOACANTHOMA LEFT LOWER LEG, AND EXCISION OF BASIL CELL CARCINOMA FROM NECK;  Surgeon: Armandina Gemma, MD;  Location: [facility];  Service: General;  Laterality: Left;  left lower leg and posterior neck   NASAL SEPTOPLASTY W/ TURBINOPLASTY  12-13-2001  dr byers @[facility]    PROSTATE BIOPSY  11/11/2014   REPLACEMENT TOTAL KNEE     ROBOT ASSISTED LAPAROSCOPIC RADICAL PROSTATECTOMY N/A 05/11/2015   Procedure: ROBOTIC ASSISTED LAPAROSCOPIC RADICAL PROSTATECTOMY LEVEL 1;  Surgeon: Raynelle Bring, MD;  Location: [facility];  Service: Urology;  Laterality: N/A;   SCLERAL BUCKLE WITH CRYO Right 08/19/2013   Procedure: SCLERAL BUCKLE WITH CRYOPEXY;  Surgeon: Hurman Horn, MD;  Location: [facility];  Service: Ophthalmology;  Laterality: Right;   TONSILLECTOMY AND ADENOIDECTOMY  [place]    Social History   Socioeconomic History   Marital status: Married    Spouse name: Not on file   Number of children: 2   Years of education: Not on file   Highest education level: Not on file  Occupational History   Occupation: physician  Tobacco Use   Smoking status: Never   Smokeless tobacco: Never  Vaping Use   Vaping Use: Never used  Substance and Sexual Activity   Alcohol use: Yes    Comment: 1-2 wine per night   Drug use: Never   Sexual activity: Not on file  Other Topics Concern   Not on file  Social History Narrative   Not on file   Social Determinants of Health   Financial Resource Strain: Not on file  Food Insecurity: Not on file  Transportation Needs: Not on file  Physical Activity: Not on file  Stress: Not on file  Social Connections: Not on file  Intimate Partner Violence: Not on file    Family History  Problem Relation Age of Onset   Kidney Stones Mother    Dementia Father    Bowel Disease Father        ischemic bowl disease   Colon cancer Neg Hx    Colon polyps Neg Hx    Esophageal cancer Neg Hx    Rectal cancer Neg Hx    Stomach cancer Neg Hx      ROS: no fevers or chills, productive cough, hemoptysis, dysphasia, odynophagia, melena, hematochezia, dysuria, hematuria, rash, seizure activity, orthopnea, PND, pedal edema, claudication. Remaining systems are negative.  Physical Exam: Well-developed well-nourished in no acute distress.  Skin is warm and dry.  HEENT is normal.  Neck is supple.  Chest is clear to auscultation with normal expansion.  Cardiovascular exam is regular rate and rhythm.  Abdominal exam nontender or  distended. No masses palpated. Extremities show no edema. neuro grossly intact  ECG- personally reviewed  A/P  1 coronary artery disease-mild on previous CTA.  Patient denies chest pain.  Continue medical therapy with aspirin and statin.  2 hyperlipidemia-continue Crestor and zetia.  Kirk Ruths, MD

## 2022-09-15 ENCOUNTER — Other Ambulatory Visit (HOSPITAL_COMMUNITY): Payer: Self-pay

## 2022-09-16 ENCOUNTER — Ambulatory Visit: Payer: Medicare Other | Attending: Cardiology | Admitting: Cardiology

## 2022-09-16 ENCOUNTER — Encounter: Payer: Self-pay | Admitting: Cardiology

## 2022-09-16 VITALS — BP 128/68 | HR 60 | Ht 73.0 in | Wt 183.2 lb

## 2022-09-16 DIAGNOSIS — E7800 Pure hypercholesterolemia, unspecified: Secondary | ICD-10-CM

## 2022-09-16 DIAGNOSIS — I2510 Atherosclerotic heart disease of native coronary artery without angina pectoris: Secondary | ICD-10-CM | POA: Diagnosis not present

## 2022-09-16 NOTE — Patient Instructions (Signed)
  Follow-Up: At [facility], you and your health needs are our priority.  As part of our continuing mission to provide you with exceptional heart care, we have created designated Provider Care Teams.  These Care Teams include your primary Cardiologist (physician) and Advanced Practice Providers (APPs -  Physician Assistants and Nurse Practitioners) who all work together to provide you with the care you need, when you need it.  We recommend signing up for the patient portal called "MyChart".  Sign up information is provided on this After Visit Summary.  MyChart is used to connect with patients for Virtual Visits (Telemedicine).  Patients are able to view lab/test results, encounter notes, upcoming appointments, etc.  Non-urgent messages can be sent to your provider as well.   To learn more about what you can do with MyChart, go to [email or web address].    Your next appointment:   12 month(s)  Provider:   Brian Crenshaw MD    

## 2022-09-25 ENCOUNTER — Telehealth: Payer: Medicare Other

## 2022-10-03 DIAGNOSIS — K080 Exfoliation of teeth due to systemic causes: Secondary | ICD-10-CM | POA: Diagnosis not present

## 2022-10-10 ENCOUNTER — Other Ambulatory Visit (HOSPITAL_COMMUNITY): Payer: Self-pay

## 2022-11-01 ENCOUNTER — Other Ambulatory Visit (HOSPITAL_COMMUNITY): Payer: Self-pay

## 2022-11-10 ENCOUNTER — Other Ambulatory Visit: Payer: Self-pay

## 2022-12-11 ENCOUNTER — Telehealth: Payer: Medicare Other | Admitting: Family Medicine

## 2022-12-11 DIAGNOSIS — J40 Bronchitis, not specified as acute or chronic: Secondary | ICD-10-CM

## 2022-12-11 MED ORDER — BENZONATATE 200 MG PO CAPS: 200.0000 mg | ORAL_CAPSULE | Freq: Two times a day (BID) | ORAL | 0 refills | Status: DC | PRN

## 2022-12-11 MED ORDER — AZITHROMYCIN 250 MG PO TABS: ORAL_TABLET | ORAL | 0 refills | Status: AC

## 2022-12-11 NOTE — Progress Notes (Signed)
Virtual Visit Consent   Timothy [name], you are scheduled for a virtual visit with a [facility] provider today. Just as with appointments in the office, your consent must be obtained to participate. Your consent will be active for this visit and any virtual visit you may have with one of our providers in the next 365 days. If you have a MyChart account, a copy of this consent can be sent to you electronically.  As this is a virtual visit, video technology does not allow for your provider to perform a traditional examination. This may limit your provider's ability to fully assess your condition. If your provider identifies any concerns that need to be evaluated in person or the need to arrange testing (such as labs, EKG, etc.), we will make arrangements to do so. Although advances in technology are sophisticated, we cannot ensure that it will always work on either your end or our end. If the connection with a video visit is poor, the visit may have to be switched to a telephone visit. With either a video or telephone visit, we are not always able to ensure that we have a secure connection.  By engaging in this virtual visit, you consent to the provision of healthcare and authorize for your insurance to be billed (if applicable) for the services provided during this visit. Depending on your insurance coverage, you may receive a charge related to this service.  I need to obtain your verbal consent now. Are you willing to proceed with your visit today? Timothy [name] has provided verbal consent on 12/11/2022 for a virtual visit (video or telephone). Georgana Curio, FNP  Date: 12/11/2022 10:17 AM  Virtual Visit via Video Note   I, Georgana Curio, connected with  Timothy [name]  ([id number], 1955-11-10) on 12/11/22 at  9:30 AM EDT by a video-enabled telemedicine application and verified that I am speaking with the correct person using two identifiers.  Location: Patient: Home Provider: Virtual Visit  Location Provider: Home Office   I discussed the limitations of evaluation and management by telemedicine and the availability of in person appointments. The patient expressed understanding and agreed to proceed.    History of Present Illness: Timothy [name] is a 67 y.o. who identifies as a male who was assigned male at birth, and is being seen today for cough, sore throat, no wheezing. 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 Zpack and tessalon work well. Low grade fever with history of bronchitis. 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   Basal cell carcinoma of neck 04/24/2014   Haglund's deformity of right heel 01/16/2014   FOOT PAIN, BILATERAL 04/12/2010   NEOPLASM OF UNCERTAIN BEHAVIOR OF SKIN 10/28/2009   HYPOGONADISM 10/28/2009   OTITIS EXTERNA 10/28/2009   CERUMEN IMPACTION 10/28/2009   ACHILLES TENDINITIS 02/11/2009   PLANTAR FASCIITIS, RIGHT 02/11/2009   ABNORMAL LABS 08/15/2008   ABNORMAL LIVER FUNCTION TESTS 08/15/2008  OBSTRUCTIVE SLEEP APNEA 10/30/2007   DIABETES MELLITUS, TYPE II 02/03/2007   DYSLIPIDEMIA 02/03/2007   DISORDERS, 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   ASTHMA 02/03/2007   MICROALBUMINURIA 02/03/2007    Allergies:  Allergies  Allergen Reactions   Sporanox [Itraconazole] Rash   Ace Inhibitors Other (See Comments)    REACTION: COUGH   Medications:  Current Outpatient Medications:    azithromycin (ZITHROMAX) 250 MG  tablet, Take 2 tablets on day 1, then 1 tablet daily on days 2 through 5, Disp: 6 tablet, Rfl: 0   benzonatate (TESSALON) 200 MG capsule, Take 1 capsule (200 mg total) by mouth 2 (two) times daily as needed for cough., Disp: 20 capsule, Rfl: 0   acetaminophen (TYLENOL) 500 MG tablet, Take 500 mg by mouth every 6 (six) hours as needed., Disp: , Rfl:    aspirin EC 81 MG tablet, Take 1 tablet (81 mg total) by mouth daily. Swallow whole., Disp: 30 tablet, Rfl: 12   Azelastine HCl 0.15 % SOLN, Place 1-2 sprays into the nose 2 (two) times daily as needed., Disp: 30 mL, Rfl: 11   blood glucose meter kit and supplies, Use to check blood sugar 1 to 2 times daily, Disp: 1 each, Rfl: 0   cetirizine (ZYRTEC) 10 MG tablet, Take 10 mg by mouth daily., Disp: , Rfl:    Dulaglutide (TRULICITY) 0.75 MG/0.5ML SOPN, Inject 0.75 mg into the skin once a week., Disp: 6 mL, Rfl: 3   ezetimibe (ZETIA) 10 MG tablet, Take 1 tablet (10 mg total) by mouth daily., Disp: 90 tablet, Rfl: 3   famotidine (PEPCID) 20 MG tablet, Take 10 mg by mouth daily as needed for heartburn or indigestion. , Disp: , Rfl:    fluticasone (FLONASE) 50 MCG/ACT nasal spray, Place 2 sprays into both nostrils daily., Disp: , Rfl:    glucose blood test strip, Use to test blood sugar 1 to 2 times per day, Disp: 200 each, Rfl: 3   lamoTRIgine (LAMICTAL) 100 MG tablet, Take 2 tablets (200 mg total) by mouth daily., Disp: 180 tablet, Rfl: 3   Lancets (ONETOUCH DELICA PLUS LANCET33G) MISC, Use to test blood sugar 1 to 2 times per day, Disp: 200 each, Rfl: 3   metFORMIN (GLUCOPHAGE) 500 MG tablet, Take 2 tablets (1,000 mg total) by mouth daily., Disp: 180 tablet, Rfl: 3   Multiple Vitamins-Minerals (CENTRUM SILVER 50+MEN) TABS, Take 1 tablet by mouth daily., Disp: , Rfl:    Omega-3 Fatty Acids (OMEGA-3 FISH OIL PO), Take 1 capsule by mouth daily., Disp: , Rfl:    rosuvastatin (CRESTOR) 20 MG tablet, Take 1 tablet (20 mg total) by mouth daily., Disp: 90 tablet,  Rfl: 3  Current Facility-Administered Medications:    0.9 %  sodium chloride infusion, 500 mL, Intravenous, Once, Meryl Dare, MD  Observations/Objective: Patient is well-developed, well-nourished in no acute distress.  Resting comfortably  at home.  Head is normocephalic, atraumatic.  No labored breathing.  Speech is clear and coherent with logical content.  Patient is alert and oriented at baseline.    Assessment and Plan: 1. Bronchitis  Increase fluids, humidifier at night, UC if sx worsen.   Follow Up Instructions: I discussed the assessment and treatment plan with the patient. The patient was provided an opportunity to ask questions and all were answered. The patient agreed with the plan and demonstrated an understanding of the instructions.  A copy of instructions were sent to the patient via MyChart unless otherwise noted below.  The patient was advised to call back or seek an in-person evaluation if the symptoms worsen or if the condition fails to improve as anticipated.  Time:  I spent 8 minutes with the patient via telehealth technology discussing the above problems/concerns.    Georgana Curio, FNP

## 2022-12-11 NOTE — Patient Instructions (Signed)

## 2022-12-13 ENCOUNTER — Ambulatory Visit (HOSPITAL_BASED_OUTPATIENT_CLINIC_OR_DEPARTMENT_OTHER): Admission: RE | Admit: 2022-12-13 | Payer: Medicare Other | Source: Ambulatory Visit | Admitting: Radiology

## 2022-12-13 ENCOUNTER — Ambulatory Visit
Admission: EM | Admit: 2022-12-13 | Discharge: 2022-12-13 | Disposition: A | Discharge status: Home / Self Care | Payer: Medicare Other | Attending: Physician Assistant | Admitting: Physician Assistant

## 2022-12-13 ENCOUNTER — Encounter: Payer: Self-pay | Admitting: Emergency Medicine

## 2022-12-13 ENCOUNTER — Encounter (HOSPITAL_BASED_OUTPATIENT_CLINIC_OR_DEPARTMENT_OTHER): Payer: Self-pay

## 2022-12-13 ENCOUNTER — Ambulatory Visit (HOSPITAL_BASED_OUTPATIENT_CLINIC_OR_DEPARTMENT_OTHER)
Admission: RE | Admit: 2022-12-13 | Discharge: 2022-12-13 | Disposition: A | Discharge status: Home / Self Care | Payer: Medicare Other | Source: Ambulatory Visit | Attending: Physician Assistant | Admitting: Physician Assistant

## 2022-12-13 ENCOUNTER — Other Ambulatory Visit: Payer: Self-pay

## 2022-12-13 DIAGNOSIS — R059 Cough, unspecified: Secondary | ICD-10-CM | POA: Insufficient documentation

## 2022-12-13 MED ORDER — ALBUTEROL SULFATE HFA 108 (90 BASE) MCG/ACT IN AERS: 2.0000 | INHALATION_SPRAY | Freq: Four times a day (QID) | RESPIRATORY_TRACT | 2 refills | Status: AC | PRN

## 2022-12-13 NOTE — ED Triage Notes (Signed)
Pt here for cough and pain with cough; congestion and productive cough; pt sts wheezing on right side; pt sts hx of PNA in past and is diabetic; pt sts noticed spike in blood sugars

## 2022-12-13 NOTE — ED Provider Notes (Signed)
[facility]    CSN: [id number] Arrival date & time: 12/13/22  1838      History   Chief Complaint Chief Complaint  Patient presents with   Cough    HPI Timothy [name] is a 67 y.o. male.   Pt complains of pain in the right upper back and persistent cough.  Pt is currently on Zithromax and Tessalon Perles for a bronchitis.  Patient reports he was very uncomfortable last night he had persistent coughing and discomfort in his right chest.  Patient denies any history of respiratory problems.  He has used an inhaler in the past.  Patient does not have fever or chills patient denies any sinus drainage.  He denies any throat pain.  The history is provided by the patient.  Cough Cough characteristics:  Productive Severity:  Moderate Timing:  Constant Progression:  Worsening Chronicity:  New Relieved by:  Nothing Worsened by:  Nothing Ineffective treatments:  None tried Associated symptoms: no fever and no shortness of breath     Past Medical History:  Diagnosis Date   Acute meniscal tear of knee    CAD (coronary artery disease)    Environmental allergies    GERD (gastroesophageal reflux disease)    History of basal cell carcinoma (BCC) excision    History of detached retina repair    left s/p laser 2019;  right s/p surgical repair 02/ 2015   History of malignant melanoma of skin    04-26-2019 per pt fall 2019 excision from back, localized, and no recurrence   History of prostate cancer urologist --- dr Laverle Patter   dx 2016-- Stage T1c, Gleason 3+3;  05-11-2015 s/p  radical prostatectomy  (04-26-2019 per pt psa undetectable)   Hx of adenomatous colonic polyps 01/18/2017   Hyperlipidemia    Mild obstructive sleep apnea    no longer has to use CPAP wt. 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   Sensorineural hearing loss (SNHL) of left ear 11/26/2019   Acquired trigger finger of right middle finger 05/27/2019   Pain in finger of right hand 05/27/2019   Pain in left knee 04/08/2019   Hx of adenomatous colonic polyps 01/18/2017   Prostate cancer (HCC) 05/11/2015   Malignant neoplasm of prostate (HCC) 12/18/2014   Cough 09/10/2014   Keratoacanthoma of lower leg, 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   Basal cell carcinoma of neck 04/24/2014   Haglund's deformity of right heel 01/16/2014   FOOT PAIN, BILATERAL 04/12/2010   NEOPLASM OF UNCERTAIN BEHAVIOR OF SKIN 10/28/2009   HYPOGONADISM 10/28/2009   OTITIS EXTERNA 10/28/2009   CERUMEN IMPACTION 10/28/2009   ACHILLES TENDINITIS 02/11/2009   PLANTAR FASCIITIS, RIGHT 02/11/2009   ABNORMAL LABS 08/15/2008   ABNORMAL LIVER FUNCTION TESTS 08/15/2008   OBSTRUCTIVE SLEEP APNEA 10/30/2007   DIABETES MELLITUS, TYPE II 02/03/2007   DYSLIPIDEMIA 02/03/2007   DISORDERS, 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   ASTHMA 02/03/2007   MICROALBUMINURIA 02/03/2007    Past Surgical History:  Procedure Laterality Date   CATARACT EXTRACTION W/ INTRAOCULAR LENS  IMPLANT, BILATERAL  01/2018   COLONOSCOPY  last one 01-12-2017   GAS INSERTION Right 08/19/2013   Procedure: INSERTION OF GAS;  Surgeon: Edmon Crape, MD;  Location: [facility] OR;  Service: Ophthalmology;  Laterality: Right;  SF6   KNEE ARTHROSCOPY Left 05/02/2019   Procedure: ARTHROSCOPY KNEE evaluation under anesthesia debridement partial medial meniscectomy chondroplasty;  Surgeon: Eugenia Mcalpine, MD;  Location[facility];  Service: Orthopedics;  Laterality: Left;  Anesthesia - Femoral nerve block/knee block/IV sedation   MASS EXCISION  Left 07/08/2014   Procedure: EXCISION OF KERATOACANTHOMA LEFT LOWER LEG, AND EXCISION OF BASIL CELL CARCINOMA FROM NECK;  Surgeon: Darnell Level, MD;  Location: [facility];  Service: General;  Laterality: Left;  left lower leg and posterior neck   NASAL SEPTOPLASTY W/ TURBINOPLASTY  12-13-2001  dr byers @[facility]    PROSTATE BIOPSY  11/11/2014   REPLACEMENT TOTAL KNEE     ROBOT ASSISTED LAPAROSCOPIC RADICAL PROSTATECTOMY N/A 05/11/2015   Procedure: ROBOTIC ASSISTED LAPAROSCOPIC RADICAL PROSTATECTOMY LEVEL 1;  Surgeon: Heloise Purpura, MD;  Location: [facility] ORS;  Service: Urology;  Laterality: N/A;   SCLERAL BUCKLE WITH CRYO Right 08/19/2013   Procedure: SCLERAL BUCKLE WITH CRYOPEXY;  Surgeon: Edmon Crape, MD;  Location: [facility] OR;  Service: Ophthalmology;  Laterality: Right;   TONSILLECTOMY AND ADENOIDECTOMY  chiuld       Home Medications    Prior to Admission medications   Medication Sig Start Date End Date Taking? Authorizing Provider  acetaminophen (TYLENOL) 500 MG tablet Take 500 mg by mouth every 6 (six) hours as needed.    [provider]  aspirin EC 81 MG tablet Take 1 tablet (81 mg total) by mouth daily. Swallow whole. 02/07/22   Lewayne Bunting, MD  Azelastine HCl 0.15 % SOLN Place 1-2 sprays into the nose 2 (two) times daily as needed. 12/21/20     azithromycin (ZITHROMAX) 250 MG tablet Take 2 tablets on day 1, then 1 tablet daily on days 2 through 5 12/11/22 12/16/22  Delorse Lek, FNP  benzonatate (TESSALON) 200 MG capsule Take 1 capsule (200 mg total) by mouth 2 (two) times daily as needed for cough. 12/11/22   Delorse Lek, FNP  blood glucose meter kit and supplies Use to check blood sugar 1 to 2 times daily 12/15/21     cetirizine (ZYRTEC) 10 MG tablet Take 10 mg by mouth daily.    [provider]  Dulaglutide (TRULICITY) 0.75 MG/0.5ML SOPN Inject 0.75 mg into the skin once a week. 08/01/22     ezetimibe (ZETIA) 10 MG tablet Take 1 tablet (10 mg total) by mouth  daily. 04/05/22   Lewayne Bunting, MD  famotidine (PEPCID) 20 MG tablet Take 10 mg by mouth daily as needed for heartburn or indigestion.     [provider]  fluticasone (FLONASE) 50 MCG/ACT nasal spray Place 2 sprays into both nostrils daily.    [provider]  glucose blood test strip Use to test blood sugar 1 to 2 times per day 12/15/21     lamoTRIgine (LAMICTAL) 100 MG tablet Take 2 tablets (200 mg total) by mouth daily. 09/06/22     Lancets (ONETOUCH DELICA PLUS LANCET33G) MISC Use to test blood sugar 1 to 2 times per day 12/15/21     metFORMIN (GLUCOPHAGE) 500 MG tablet Take 2 tablets (1,000 mg total) by mouth daily. 01/25/22  Multiple Vitamins-Minerals (CENTRUM SILVER 50+MEN) TABS Take 1 tablet by mouth daily.    [provider]  Omega-3 Fatty Acids (OMEGA-3 FISH OIL PO) Take 1 capsule by mouth daily.    [provider]  rosuvastatin (CRESTOR) 20 MG tablet Take 1 tablet (20 mg total) by mouth daily. 05/09/22   Lewayne Bunting, MD    Family History Family History  Problem Relation Age of Onset   Kidney Stones Mother    Dementia Father    Bowel Disease Father        ischemic bowl disease   Colon cancer Neg Hx    Colon polyps Neg Hx    Esophageal cancer Neg Hx    Rectal cancer Neg Hx    Stomach cancer Neg Hx     Social History Social History   Tobacco Use   Smoking status: Never   Smokeless tobacco: Never  Vaping Use   Vaping Use: Never used  Substance Use Topics   Alcohol use: Yes    Comment: 1-2 wine per night   Drug use: Never     Allergies   Sporanox [itraconazole] and Ace inhibitors   Review of Systems Review of Systems  Constitutional:  Negative for fever.  Respiratory:  Positive for cough. Negative for shortness of breath.   All other systems reviewed and are negative.    Physical Exam Triage Vital Signs ED Triage Vitals  Enc Vitals Group     BP 12/13/22 1854 126/73     Pulse Rate 12/13/22 1854 77     Resp  12/13/22 1854 18     Temp 12/13/22 1854 98.4 F (36.9 C)     Temp Source 12/13/22 1854 Oral     SpO2 12/13/22 1854 95 %     Weight --      Height --      Head Circumference --      Peak Flow --      Pain Score 12/13/22 1855 5     Pain Loc --      Pain Edu? --      Excl. in GC? --    No data found.  Updated Vital Signs BP 126/73 (BP Location: Left Arm)   Pulse 77   Temp 98.4 F (36.9 C) (Oral)   Resp 18   SpO2 95%   Visual Acuity Right Eye Distance:   Left Eye Distance:   Bilateral Distance:    Right Eye Near:   Left Eye Near:    Bilateral Near:     Physical Exam Vitals and nursing note reviewed.  Constitutional:      Appearance: He is well-developed.  HENT:     Head: Normocephalic.     Mouth/Throat:     Mouth: Mucous membranes are moist.  Eyes:     Extraocular Movements: Extraocular movements intact.     Pupils: Pupils are equal, round, and reactive to light.  Cardiovascular:     Rate and Rhythm: Normal rate.  Pulmonary:     Effort: Pulmonary effort is normal.     Breath sounds: Rhonchi present.     Comments: Rhonchi and faint wheezing right base Abdominal:     General: There is no distension.  Musculoskeletal:        General: Swelling present. Normal range of motion.     Cervical back: Normal range of motion.  Skin:    General: Skin is warm.  Neurological:     General: No focal deficit present.  Mental Status: He is alert and oriented to person, place, and time.  Psychiatric:        Mood and Affect: Mood normal.      UC Treatments / Results  Labs (all labs ordered are listed, but only abnormal results are displayed) Labs Reviewed - No data to display  EKG   Radiology No results found.  Procedures Procedures (including critical care time)  Medications Ordered in UC Medications - No data to display  Initial Impression / Assessment and Plan / UC Course  I have reviewed the triage vital signs and the nursing notes.  Pertinent labs  & imaging results that were available during my care of the patient were reviewed by me and considered in my medical decision making (see chart for details).    MDM; to [facility] for a chest x-ray.  Chest x-ray shows no evidence of pneumonia no acute findings per radiologist.  I spoke with the patient about his results I will treat him with an albuterol inhaler.  Patient is advised to follow-up with his primary care physician for recheck  Final Clinical Impressions(s) / UC Diagnoses   Final diagnoses:  Cough, unspecified type   Discharge Instructions   None    ED Prescriptions     Medication Sig Dispense Auth. Provider   albuterol (VENTOLIN HFA) 108 (90 Base) MCG/ACT inhaler Inhale 2 puffs into the lungs every 6 (six) hours as needed for wheezing or shortness of breath. 8 g Elson Areas, [place]      PDMP not reviewed this encounter. An After Visit Summary was printed and given to the patient.       Elson Areas, [place] 12/16/22 0102

## 2022-12-20 ENCOUNTER — Other Ambulatory Visit (HOSPITAL_COMMUNITY): Payer: Self-pay

## 2022-12-20 DIAGNOSIS — E7800 Pure hypercholesterolemia, unspecified: Secondary | ICD-10-CM | POA: Diagnosis not present

## 2022-12-20 DIAGNOSIS — E119 Type 2 diabetes mellitus without complications: Secondary | ICD-10-CM | POA: Diagnosis not present

## 2022-12-20 DIAGNOSIS — I2510 Atherosclerotic heart disease of native coronary artery without angina pectoris: Secondary | ICD-10-CM | POA: Diagnosis not present

## 2022-12-20 DIAGNOSIS — R748 Abnormal levels of other serum enzymes: Secondary | ICD-10-CM | POA: Diagnosis not present

## 2022-12-20 MED ORDER — METFORMIN HCL 500 MG PO TABS
1000.0000 mg | ORAL_TABLET | Freq: Two times a day (BID) | ORAL | 3 refills | Status: DC
  Filled 2022-12-20: qty 360, 90d supply, fill #0
  Filled 2023-04-26: qty 360, 90d supply, fill #1
  Filled 2023-11-11 – 2023-11-13 (×2): qty 360, 90d supply, fill #3

## 2022-12-20 MED ORDER — DOXYCYCLINE HYCLATE 100 MG PO TABS
100.0000 mg | ORAL_TABLET | Freq: Two times a day (BID) | ORAL | 0 refills | Status: DC
Start: 2022-12-20 — End: 2023-08-10
  Filled 2022-12-20: qty 10, 5d supply, fill #0

## 2022-12-20 MED ORDER — PREDNISONE 10 MG PO TABS
ORAL_TABLET | ORAL | 0 refills | Status: DC
  Filled 2022-12-20: qty 15, 5d supply, fill #0

## 2023-01-09 ENCOUNTER — Other Ambulatory Visit (HOSPITAL_COMMUNITY): Payer: Self-pay

## 2023-01-09 DIAGNOSIS — K080 Exfoliation of teeth due to systemic causes: Secondary | ICD-10-CM | POA: Diagnosis not present

## 2023-01-09 DIAGNOSIS — L570 Actinic keratosis: Secondary | ICD-10-CM | POA: Diagnosis not present

## 2023-01-09 DIAGNOSIS — D692 Other nonthrombocytopenic purpura: Secondary | ICD-10-CM | POA: Diagnosis not present

## 2023-01-09 DIAGNOSIS — Z85820 Personal history of malignant melanoma of skin: Secondary | ICD-10-CM | POA: Diagnosis not present

## 2023-01-09 DIAGNOSIS — Z85828 Personal history of other malignant neoplasm of skin: Secondary | ICD-10-CM | POA: Diagnosis not present

## 2023-01-09 DIAGNOSIS — L821 Other seborrheic keratosis: Secondary | ICD-10-CM | POA: Diagnosis not present

## 2023-01-12 ENCOUNTER — Other Ambulatory Visit (HOSPITAL_COMMUNITY): Payer: Self-pay

## 2023-02-20 ENCOUNTER — Other Ambulatory Visit (HOSPITAL_COMMUNITY): Payer: Self-pay

## 2023-02-20 MED ORDER — AMOXICILLIN 500 MG PO CAPS
1500.0000 mg | ORAL_CAPSULE | ORAL | 0 refills | Status: DC
  Filled 2023-02-20: qty 15, 5d supply, fill #0

## 2023-02-22 ENCOUNTER — Other Ambulatory Visit (HOSPITAL_COMMUNITY): Payer: Self-pay

## 2023-02-22 DIAGNOSIS — S8392XA Sprain of unspecified site of left knee, initial encounter: Secondary | ICD-10-CM | POA: Diagnosis not present

## 2023-02-22 DIAGNOSIS — Z96652 Presence of left artificial knee joint: Secondary | ICD-10-CM | POA: Diagnosis not present

## 2023-02-22 MED ORDER — PREDNISONE 5 MG PO TABS
ORAL_TABLET | ORAL | 0 refills | Status: DC
  Filled 2023-02-22: qty 48, 12d supply, fill #0

## 2023-02-22 MED ORDER — DICLOFENAC SODIUM 75 MG PO TBEC
75.0000 mg | DELAYED_RELEASE_TABLET | Freq: Two times a day (BID) | ORAL | 1 refills | Status: DC
  Filled 2023-02-22: qty 60, 30d supply, fill #0

## 2023-02-28 DIAGNOSIS — F09 Unspecified mental disorder due to known physiological condition: Secondary | ICD-10-CM | POA: Diagnosis not present

## 2023-02-28 DIAGNOSIS — G4733 Obstructive sleep apnea (adult) (pediatric): Secondary | ICD-10-CM | POA: Diagnosis not present

## 2023-02-28 DIAGNOSIS — F320 Major depressive disorder, single episode, mild: Secondary | ICD-10-CM | POA: Diagnosis not present

## 2023-03-02 DIAGNOSIS — K080 Exfoliation of teeth due to systemic causes: Secondary | ICD-10-CM | POA: Diagnosis not present

## 2023-03-14 ENCOUNTER — Other Ambulatory Visit: Payer: Self-pay

## 2023-03-14 ENCOUNTER — Other Ambulatory Visit (HOSPITAL_COMMUNITY): Payer: Self-pay

## 2023-03-14 ENCOUNTER — Other Ambulatory Visit: Payer: Self-pay | Admitting: Cardiology

## 2023-03-14 DIAGNOSIS — E7800 Pure hypercholesterolemia, unspecified: Secondary | ICD-10-CM

## 2023-03-14 MED ORDER — EZETIMIBE 10 MG PO TABS
10.0000 mg | ORAL_TABLET | Freq: Every day | ORAL | 1 refills | Status: DC
Start: 2023-03-14 — End: 2023-09-23
  Filled 2023-03-14: qty 90, 90d supply, fill #0
  Filled 2023-06-29: qty 90, 90d supply, fill #1

## 2023-03-15 ENCOUNTER — Other Ambulatory Visit (HOSPITAL_COMMUNITY): Payer: Self-pay

## 2023-03-29 ENCOUNTER — Other Ambulatory Visit (HOSPITAL_COMMUNITY): Payer: Self-pay

## 2023-03-29 DIAGNOSIS — F32A Depression, unspecified: Secondary | ICD-10-CM | POA: Diagnosis not present

## 2023-03-29 DIAGNOSIS — Z79899 Other long term (current) drug therapy: Secondary | ICD-10-CM | POA: Diagnosis not present

## 2023-03-29 MED ORDER — CLONIDINE HCL 0.1 MG PO TABS
0.1000 mg | ORAL_TABLET | Freq: Every day | ORAL | 0 refills | Status: DC
  Filled 2023-03-29: qty 60, 30d supply, fill #0

## 2023-03-30 ENCOUNTER — Other Ambulatory Visit (HOSPITAL_COMMUNITY): Payer: Self-pay

## 2023-03-30 MED ORDER — ONETOUCH DELICA PLUS LANCET33G MISC
1.0000 | Freq: Three times a day (TID) | 3 refills | Status: DC
  Filled 2023-03-30: qty 300, 90d supply, fill #0

## 2023-04-07 ENCOUNTER — Other Ambulatory Visit (HOSPITAL_COMMUNITY): Payer: Self-pay

## 2023-04-12 ENCOUNTER — Other Ambulatory Visit (HOSPITAL_COMMUNITY): Payer: Self-pay

## 2023-04-12 MED ORDER — AZITHROMYCIN 250 MG PO TABS
ORAL_TABLET | ORAL | 0 refills | Status: AC
  Filled 2023-04-12: qty 6, 5d supply, fill #0

## 2023-04-17 DIAGNOSIS — K080 Exfoliation of teeth due to systemic causes: Secondary | ICD-10-CM | POA: Diagnosis not present

## 2023-04-26 ENCOUNTER — Other Ambulatory Visit (HOSPITAL_COMMUNITY): Payer: Self-pay

## 2023-04-26 ENCOUNTER — Other Ambulatory Visit: Payer: Self-pay

## 2023-04-26 DIAGNOSIS — F411 Generalized anxiety disorder: Secondary | ICD-10-CM | POA: Diagnosis not present

## 2023-04-26 DIAGNOSIS — F32A Depression, unspecified: Secondary | ICD-10-CM | POA: Diagnosis not present

## 2023-04-26 DIAGNOSIS — Z79899 Other long term (current) drug therapy: Secondary | ICD-10-CM | POA: Diagnosis not present

## 2023-04-26 MED ORDER — CLONIDINE HCL 0.2 MG PO TABS
0.2000 mg | ORAL_TABLET | Freq: Every evening | ORAL | 2 refills | Status: DC
  Filled 2023-04-26: qty 30, 30d supply, fill #0
  Filled 2023-05-29: qty 30, 30d supply, fill #1
  Filled 2023-07-03: qty 30, 30d supply, fill #2

## 2023-04-27 ENCOUNTER — Other Ambulatory Visit (HOSPITAL_COMMUNITY): Payer: Self-pay

## 2023-04-27 MED ORDER — QUETIAPINE FUMARATE 25 MG PO TABS
25.0000 mg | ORAL_TABLET | Freq: Every evening | ORAL | 2 refills | Status: DC | PRN
  Filled 2023-04-27: qty 30, 15d supply, fill #0
  Filled 2023-05-17: qty 30, 15d supply, fill #1

## 2023-05-04 ENCOUNTER — Encounter: Payer: Self-pay | Admitting: Internal Medicine

## 2023-05-08 ENCOUNTER — Encounter: Payer: Self-pay | Admitting: Internal Medicine

## 2023-05-08 ENCOUNTER — Other Ambulatory Visit (HOSPITAL_COMMUNITY): Payer: Self-pay

## 2023-05-08 DIAGNOSIS — E1169 Type 2 diabetes mellitus with other specified complication: Secondary | ICD-10-CM | POA: Diagnosis not present

## 2023-05-08 DIAGNOSIS — E7800 Pure hypercholesterolemia, unspecified: Secondary | ICD-10-CM | POA: Diagnosis not present

## 2023-05-08 DIAGNOSIS — Z8546 Personal history of malignant neoplasm of prostate: Secondary | ICD-10-CM | POA: Diagnosis not present

## 2023-05-08 DIAGNOSIS — Z0000 Encounter for general adult medical examination without abnormal findings: Secondary | ICD-10-CM | POA: Diagnosis not present

## 2023-05-08 DIAGNOSIS — Z5181 Encounter for therapeutic drug level monitoring: Secondary | ICD-10-CM | POA: Diagnosis not present

## 2023-05-08 DIAGNOSIS — R748 Abnormal levels of other serum enzymes: Secondary | ICD-10-CM | POA: Diagnosis not present

## 2023-05-08 LAB — HEMOGLOBIN A1C: A1c: 6

## 2023-05-15 ENCOUNTER — Other Ambulatory Visit (HOSPITAL_COMMUNITY): Payer: Self-pay

## 2023-05-15 MED ORDER — ONETOUCH VERIO VI STRP
ORAL_STRIP | 3 refills | Status: DC
  Filled 2023-05-15: qty 300, 90d supply, fill #0
  Filled 2024-03-15: qty 300, 90d supply, fill #1

## 2023-05-17 ENCOUNTER — Other Ambulatory Visit (HOSPITAL_COMMUNITY): Payer: Self-pay

## 2023-05-17 ENCOUNTER — Other Ambulatory Visit: Payer: Self-pay | Admitting: Cardiology

## 2023-05-17 ENCOUNTER — Other Ambulatory Visit: Payer: Self-pay

## 2023-05-17 DIAGNOSIS — H6121 Impacted cerumen, right ear: Secondary | ICD-10-CM | POA: Diagnosis not present

## 2023-05-17 MED ORDER — ROSUVASTATIN CALCIUM 20 MG PO TABS
20.0000 mg | ORAL_TABLET | Freq: Every day | ORAL | 3 refills | Status: DC
  Filled 2023-05-17: qty 90, 90d supply, fill #0
  Filled 2023-08-26: qty 90, 90d supply, fill #1
  Filled 2023-11-21: qty 90, 90d supply, fill #2
  Filled 2024-02-19: qty 90, 90d supply, fill #3

## 2023-05-17 MED ORDER — ROSUVASTATIN CALCIUM 20 MG PO TABS
20.0000 mg | ORAL_TABLET | Freq: Every day | ORAL | 1 refills | Status: DC
  Filled 2023-05-17 – 2023-05-29 (×2): qty 90, 90d supply, fill #0

## 2023-05-18 DIAGNOSIS — F605 Obsessive-compulsive personality disorder: Secondary | ICD-10-CM | POA: Diagnosis not present

## 2023-05-29 ENCOUNTER — Other Ambulatory Visit (HOSPITAL_COMMUNITY): Payer: Self-pay

## 2023-05-29 ENCOUNTER — Other Ambulatory Visit: Payer: Self-pay

## 2023-05-29 DIAGNOSIS — Z79899 Other long term (current) drug therapy: Secondary | ICD-10-CM | POA: Diagnosis not present

## 2023-05-29 DIAGNOSIS — F411 Generalized anxiety disorder: Secondary | ICD-10-CM | POA: Diagnosis not present

## 2023-05-29 DIAGNOSIS — F32A Depression, unspecified: Secondary | ICD-10-CM | POA: Diagnosis not present

## 2023-05-29 MED ORDER — LAMOTRIGINE 100 MG PO TABS
200.0000 mg | ORAL_TABLET | Freq: Every day | ORAL | 3 refills | Status: DC
  Filled 2023-05-29: qty 180, 90d supply, fill #0
  Filled 2023-09-06: qty 180, 90d supply, fill #1
  Filled 2023-12-06: qty 180, 90d supply, fill #2

## 2023-05-30 DIAGNOSIS — F605 Obsessive-compulsive personality disorder: Secondary | ICD-10-CM | POA: Diagnosis not present

## 2023-05-31 ENCOUNTER — Other Ambulatory Visit (HOSPITAL_COMMUNITY): Payer: Self-pay

## 2023-06-07 DIAGNOSIS — F605 Obsessive-compulsive personality disorder: Secondary | ICD-10-CM | POA: Diagnosis not present

## 2023-06-19 ENCOUNTER — Telehealth: Payer: Self-pay | Admitting: *Deleted

## 2023-06-19 ENCOUNTER — Ambulatory Visit: Payer: Medicare Other

## 2023-06-19 ENCOUNTER — Other Ambulatory Visit (HOSPITAL_COMMUNITY): Payer: Self-pay

## 2023-06-19 NOTE — Telephone Encounter (Signed)
Attempt to reach pt for pre-visit. LM with call back #. Will attempt to reach again in 5 min due to no other # listed in profile Attempt to reach pt for pre-visit. LM with call back #.   Second attempt to reach pt for pre-vist unsuccessful. LM with facility # for pt to call back. Instructed pt to call # given by end of the day and reschedule the pre-visit  with RN or the scheduled procedure will be canceled.

## 2023-06-20 ENCOUNTER — Other Ambulatory Visit (HOSPITAL_COMMUNITY): Payer: Self-pay

## 2023-06-20 MED ORDER — TRULICITY 0.75 MG/0.5ML ~~LOC~~ SOAJ
0.7500 mg | SUBCUTANEOUS | 3 refills | Status: DC
  Filled 2023-06-20: qty 6, 84d supply, fill #0
  Filled 2023-09-06: qty 6, 84d supply, fill #1
  Filled 2023-12-13: qty 6, 84d supply, fill #2
  Filled 2024-02-26: qty 6, 84d supply, fill #3

## 2023-07-04 DIAGNOSIS — F605 Obsessive-compulsive personality disorder: Secondary | ICD-10-CM | POA: Diagnosis not present

## 2023-07-05 DIAGNOSIS — F411 Generalized anxiety disorder: Secondary | ICD-10-CM | POA: Diagnosis not present

## 2023-07-05 DIAGNOSIS — F32A Depression, unspecified: Secondary | ICD-10-CM | POA: Diagnosis not present

## 2023-07-05 DIAGNOSIS — Z79899 Other long term (current) drug therapy: Secondary | ICD-10-CM | POA: Diagnosis not present

## 2023-07-10 ENCOUNTER — Other Ambulatory Visit (HOSPITAL_COMMUNITY): Payer: Self-pay

## 2023-07-10 DIAGNOSIS — H524 Presbyopia: Secondary | ICD-10-CM | POA: Diagnosis not present

## 2023-07-10 DIAGNOSIS — S8392XA Sprain of unspecified site of left knee, initial encounter: Secondary | ICD-10-CM | POA: Diagnosis not present

## 2023-07-10 DIAGNOSIS — Z96652 Presence of left artificial knee joint: Secondary | ICD-10-CM | POA: Diagnosis not present

## 2023-07-11 ENCOUNTER — Encounter: Payer: Medicare Other | Admitting: Internal Medicine

## 2023-07-12 DIAGNOSIS — Z85820 Personal history of malignant melanoma of skin: Secondary | ICD-10-CM | POA: Diagnosis not present

## 2023-07-12 DIAGNOSIS — D692 Other nonthrombocytopenic purpura: Secondary | ICD-10-CM | POA: Diagnosis not present

## 2023-07-12 DIAGNOSIS — Z85828 Personal history of other malignant neoplasm of skin: Secondary | ICD-10-CM | POA: Diagnosis not present

## 2023-07-12 DIAGNOSIS — L853 Xerosis cutis: Secondary | ICD-10-CM | POA: Diagnosis not present

## 2023-07-12 DIAGNOSIS — L570 Actinic keratosis: Secondary | ICD-10-CM | POA: Diagnosis not present

## 2023-07-18 ENCOUNTER — Ambulatory Visit (AMBULATORY_SURGERY_CENTER): Payer: Medicare Other | Admitting: *Deleted

## 2023-07-18 VITALS — Ht 73.0 in | Wt 173.0 lb

## 2023-07-18 DIAGNOSIS — Z860100 Personal history of colon polyps, unspecified: Secondary | ICD-10-CM

## 2023-07-18 DIAGNOSIS — F605 Obsessive-compulsive personality disorder: Secondary | ICD-10-CM | POA: Diagnosis not present

## 2023-07-18 DIAGNOSIS — F3289 Other specified depressive episodes: Secondary | ICD-10-CM | POA: Diagnosis not present

## 2023-07-18 MED ORDER — SUFLAVE 178.7 G PO SOLR: 1.0000 | ORAL | 0 refills | Status: DC

## 2023-07-18 NOTE — Progress Notes (Signed)
Pt's name and DOB verified at the beginning of the pre-visit wit 2 identifiers  Pt denies any difficulty with ambulating,sitting, laying down or rolling side to side  Pt has no issues with ambulation   Pt has no issues moving head neck or swallowing  No egg or soy allergy known to patient   No issues known to pt with past sedation with any surgeries or procedures  Pt denies having issues being intubated  No FH of Malignant Hyperthermia  Pt is not on diet pills or shots  Pt is not on home 02   Pt is not on blood thinners   Pt denies issues with constipation   Pt is not on dialysis  Pt denise any abnormal heart rhythms   Pt denies any upcoming cardiac testing  Pt encouraged to use to use Singlecare or Goodrx to reduce cost   Patient's chart reviewed by Cathlyn Parsons CNRA prior to pre-visit and patient appropriate for the [facility].  Pre-visit completed and red dot placed by patient's name on their procedure day (on provider's schedule).  .  Visit by phone  Pt states weight is 173 lb  Instructed pt why it is important to and  to call if they have any changes in health or new medications. Directed them to the # given and on instructions.     Instructions reviewed. Pt given both [facility] main # and MD on call # prior to instructions.  Pt states understanding. Instructed to review again prior to procedure. Pt states they will.   Informed pt that they will receive a call from [facility] regarding there prep med.

## 2023-07-25 ENCOUNTER — Encounter: Payer: Self-pay | Admitting: Internal Medicine

## 2023-07-25 DIAGNOSIS — F3289 Other specified depressive episodes: Secondary | ICD-10-CM | POA: Diagnosis not present

## 2023-07-25 DIAGNOSIS — F605 Obsessive-compulsive personality disorder: Secondary | ICD-10-CM | POA: Diagnosis not present

## 2023-07-28 ENCOUNTER — Other Ambulatory Visit (HOSPITAL_COMMUNITY): Payer: Self-pay

## 2023-07-28 MED ORDER — VIVOTIF PO CPDR
1.0000 | DELAYED_RELEASE_CAPSULE | ORAL | 0 refills | Status: DC
  Filled 2023-07-28: qty 4, 8d supply, fill #0

## 2023-07-28 MED ORDER — ATOVAQUONE-PROGUANIL HCL 250-100 MG PO TABS
1.0000 | ORAL_TABLET | Freq: Every day | ORAL | 0 refills | Status: AC
  Filled 2023-07-28: qty 18, 18d supply, fill #0

## 2023-07-28 MED ORDER — CIPROFLOXACIN HCL 750 MG PO TABS
750.0000 mg | ORAL_TABLET | Freq: Every day | ORAL | 0 refills | Status: DC
  Filled 2023-07-28: qty 3, 3d supply, fill #0

## 2023-07-31 ENCOUNTER — Other Ambulatory Visit (HOSPITAL_COMMUNITY): Payer: Self-pay

## 2023-07-31 ENCOUNTER — Telehealth: Payer: Self-pay | Admitting: Internal Medicine

## 2023-07-31 ENCOUNTER — Encounter: Payer: Medicare Other | Admitting: Internal Medicine

## 2023-07-31 NOTE — Telephone Encounter (Signed)
[name] called me and indicated he had to cancel his colonoscopy due to a family emergency.  He will call back to reschedule.  Do not charge a cancel fee

## 2023-08-07 ENCOUNTER — Telehealth: Payer: Self-pay | Admitting: Cardiology

## 2023-08-07 ENCOUNTER — Other Ambulatory Visit (HOSPITAL_BASED_OUTPATIENT_CLINIC_OR_DEPARTMENT_OTHER): Payer: Self-pay

## 2023-08-07 ENCOUNTER — Other Ambulatory Visit: Payer: Self-pay

## 2023-08-07 ENCOUNTER — Other Ambulatory Visit (HOSPITAL_COMMUNITY): Payer: Self-pay

## 2023-08-07 MED ORDER — CLONIDINE HCL 0.2 MG PO TABS
0.2000 mg | ORAL_TABLET | Freq: Every evening | ORAL | 2 refills | Status: DC
  Filled 2023-08-07: qty 30, 30d supply, fill #0
  Filled 2023-09-06: qty 30, 30d supply, fill #1
  Filled 2023-10-06 – 2023-10-07 (×2): qty 30, 30d supply, fill #2

## 2023-08-07 NOTE — Telephone Encounter (Signed)
   Pt c/o of Chest Pain: STAT if active CP, including tightness, pressure, jaw pain, radiating pain to shoulder/upper arm/back, CP unrelieved by Nitro. Symptoms reported of SOB, nausea, vomiting, sweating.  1. Are you having CP right now? No pain now    2. Are you experiencing any other symptoms (ex. SOB, nausea, vomiting, sweating)? No dyspnea. Have Djd of right shoulder. So may be musculoskeletal   3. Is your CP continuous or coming and going? Comes and goes    4. Have you taken Nitroglycerin ? No ntg use   5. How long have you been experiencing CP? Cp several months.    6. If NO CP at time of call then end call with telling Pt to call back or call 911 if Chest pain returns prior to return call from triage team.   Answers received via pt schedule. Please advise.

## 2023-08-07 NOTE — Telephone Encounter (Signed)
 Patient identification verified by 2 forms. Hilton Lucky, RN    Called and spoke to patient  Patient states:   -she has intermittent chest pain on going for a few months   -Notes it has been occurring more frequently   -pain will occur 1-2x a week   -chest pain sometimes occurs with stress  -does not have chest pain with activity, does not wake him up at night   -woks out daily, post a workout he develops shoulder pain and chest pain   -chest and shoulder pain are relieved with tylenol    -believes left shoulder pain is chronic   -did not mention to PCP, at 04/2023 OV   -mainly wants to have follow up regarding CAD  Patient denies:   -SOB   -lightheaded/chest pain Patient scheduled for OV 2/13 at 2:45pm  Reviewed strict ED warning signs/precautions  Patient verbalized understanding, no questions at this time

## 2023-08-08 DIAGNOSIS — F605 Obsessive-compulsive personality disorder: Secondary | ICD-10-CM | POA: Diagnosis not present

## 2023-08-08 DIAGNOSIS — F3289 Other specified depressive episodes: Secondary | ICD-10-CM | POA: Diagnosis not present

## 2023-08-10 ENCOUNTER — Encounter: Payer: Self-pay | Admitting: Adult Health

## 2023-08-10 ENCOUNTER — Ambulatory Visit: Payer: Medicare Other | Attending: Adult Health | Admitting: Adult Health

## 2023-08-10 VITALS — BP 132/68 | HR 62 | Ht 73.0 in | Wt 178.0 lb

## 2023-08-10 DIAGNOSIS — C61 Malignant neoplasm of prostate: Secondary | ICD-10-CM | POA: Diagnosis not present

## 2023-08-10 DIAGNOSIS — R079 Chest pain, unspecified: Secondary | ICD-10-CM

## 2023-08-10 DIAGNOSIS — E7800 Pure hypercholesterolemia, unspecified: Secondary | ICD-10-CM

## 2023-08-10 DIAGNOSIS — I2510 Atherosclerotic heart disease of native coronary artery without angina pectoris: Secondary | ICD-10-CM | POA: Diagnosis not present

## 2023-08-10 DIAGNOSIS — E118 Type 2 diabetes mellitus with unspecified complications: Secondary | ICD-10-CM

## 2023-08-10 NOTE — Patient Instructions (Signed)
 Medication Instructions:  No Changes *If you need a refill on your cardiac medications before your next appointment, please call your pharmacy*   Lab Work: CBC, BMET If you have labs (blood work) drawn today and your tests are completely normal, you will receive your results only by: MyChart Message (if you have MyChart) OR A paper copy in the mail If you have any lab test that is abnormal or we need to change your treatment, we will call you to review the results.   Testing/Procedures:       Cardiac/Peripheral Catheterization   You are scheduled for a Cardiac Catheterization on Tuesday, February 18 with Dr. Tonny Bollman.  1. Please arrive at the [facility] (Main Entrance A) at [facility]: [address] [place], [place] [address] at 8:30 AM (This time is 2 hour(s) before your procedure to ensure your preparation).   Free valet parking service is available. You will check in at ADMITTING. The support person will be asked to wait in the waiting room.  It is OK to have someone drop you off and come back when you are ready to be discharged.        Special note: Every effort is made to have your procedure done on time. Please understand that emergencies sometimes delay scheduled procedures.  2. Diet: Do not eat solid foods after midnight.  You may have clear liquids until 5 AM the day of the procedure.  3. Labs: You will need to have blood drawn on Thursday, February 13 at [facility] [address], [place]  Open: 8am - 5pm (Lunch 12:30 - 1:30)   Phone: [phone]. You do not need to be fasting.  4. Medication instructions in preparation for your procedure:   Contrast Allergy: No   Current Outpatient Medications (Endocrine & Metabolic):    Dulaglutide (TRULICITY) 0.75 MG/0.5ML SOAJ, Inject 0.75 mg into the skin once a week.   metFORMIN (GLUCOPHAGE) 500 MG tablet, Take 2 tablets (1,000 mg total) by mouth 2 (two) times daily as directed   predniSONE  (DELTASONE) 10 MG tablet, Take 5 tablets by mouth on Day 1, then 4 tablets on Day 2, then 3 tablets on Day 3, then 2 tablets on Day 4 then 1 tablet on Day 5 . (Patient not taking: Reported on 08/10/2023)   predniSONE (DELTASONE) 5 MG tablet, Take as directed for 12 days. Avoid NSAIDS while on this medication. (Patient not taking: Reported on 08/10/2023)   Current Outpatient Medications (Cardiovascular):    cloNIDine (CATAPRES) 0.2 MG tablet, Take 1 tablet (0.2 mg total) by mouth at bedtime.   ezetimibe (ZETIA) 10 MG tablet, Take 1 tablet (10 mg total) by mouth daily.   rosuvastatin (CRESTOR) 20 MG tablet, Take 1 tablet (20 mg total) by mouth daily.   rosuvastatin (CRESTOR) 20 MG tablet, Take 1 tablet (20 mg total) by mouth daily. (Patient not taking: Reported on 08/10/2023)   Current Outpatient Medications (Respiratory):    Azelastine HCl 0.15 % SOLN, Place 1-2 sprays into the nose 2 (two) times daily as needed.   cetirizine (ZYRTEC) 10 MG tablet, Take 10 mg by mouth daily.   albuterol (VENTOLIN HFA) 108 (90 Base) MCG/ACT inhaler, Inhale 2 puffs into the lungs every 6 (six) hours as needed for wheezing or shortness of breath. (Patient not taking: Reported on 08/10/2023)   benzonatate (TESSALON) 200 MG capsule, Take 1 capsule (200 mg total) by mouth 2 (two) times daily as needed for cough. (Patient not taking:  Reported on 08/10/2023)   fluticasone (FLONASE) 50 MCG/ACT nasal spray, Place 2 sprays into both nostrils daily. (Patient not taking: Reported on 08/10/2023)   Current Outpatient Medications (Analgesics):    acetaminophen (TYLENOL) 500 MG tablet, Take 500 mg by mouth every 6 (six) hours as needed.   aspirin EC 81 MG tablet, Take 1 tablet (81 mg total) by mouth daily. Swallow whole.   diclofenac (VOLTAREN) 75 MG EC tablet, Start after finishing prednisone. Take 1 tablet (75 mg total) by mouth 2 (two) times daily. (Patient not taking: Reported on 08/10/2023)     Current Outpatient Medications  (Other):    atovaquone-proguanil (MALARONE) 250-100 MG TABS tablet, Take 1 tablet by mouth daily ,start 1 to 2 days prior to entering a malaria-endemic area, continue throughout the stay and for 7 days after returning   blood glucose meter kit and supplies, Use to check blood sugar 1 to 2 times daily   ciprofloxacin (CIPRO) 750 MG tablet, Take 1 tablet (750 mg total) by mouth as a one time dose for travelers diarrhea.   famotidine (PEPCID) 20 MG tablet, Take 10 mg by mouth daily as needed for heartburn or indigestion.    glucose blood (ONETOUCH VERIO) test strip, Check blood glucose up to 3 times daily   lamoTRIgine (LAMICTAL) 100 MG tablet, Take 2 tablets (200 mg total) by mouth daily.   Lancets (ONETOUCH DELICA PLUS LANCET33G) MISC, Use to test blood sugar 1 to 2 times per day   Multiple Vitamins-Minerals (CENTRUM SILVER 50+MEN) TABS, Take 1 tablet by mouth daily.   PEG 3350-KCl-NaCl-NaSulf-MgSul (SUFLAVE) 178.7 g SOLR, Take 1 kit by mouth as directed.   typhoid (VIVOTIF) DR capsule, Take 1 capsule by mouth every other day on alternate days (day 1, 3, 5, and 7) for a total of 4 doses; all doses should be complete at least 1 week prior to potential exposure   amoxicillin (AMOXIL) 500 MG capsule, Take 3 capsules (1,500 mg total) by mouth before dental procedures (Patient not taking: Reported on 08/10/2023)   doxycycline (VIBRA-TABS) 100 MG tablet, Take 1 tablet (100 mg total) by mouth 2 (two) times daily for 5 days (Patient not taking: Reported on 08/10/2023)   glucose blood test strip, Use to test blood sugar 1 to 2 times per day (Patient not taking: Reported on 08/10/2023)   lamoTRIgine (LAMICTAL) 100 MG tablet, Take 2 tablets (200 mg total) by mouth daily. (Patient not taking: Reported on 08/10/2023)   Lancets (ONETOUCH DELICA PLUS LANCET33G) MISC, Use to check blood sugar levels up to three (3) times daily. (Patient not taking: Reported on 08/10/2023)   Omega-3 Fatty Acids (OMEGA-3 FISH OIL PO), Take  1 capsule by mouth daily. (Patient not taking: Reported on 08/10/2023)   QUEtiapine (SEROQUEL) 25 MG tablet, Take 1 tablet (25 mg total) by mouth nightly. May increase to 2 tablets nightly if needed. (Patient not taking: Reported on 08/10/2023)  Current Facility-Administered Medications (Other):    0.9 %  sodium chloride infusion *For reference purposes while preparing patient instructions.   Delete this med list prior to printing instructions for patient.*    Do not take Diabetes Med Glucophage (Metformin) on the day of the procedure and HOLD 48 HOURS AFTER THE PROCEDURE.  On the morning of your procedure, take Aspirin 81 mg and any morning medicines NOT listed above.  You may use sips of water.  5. Plan to go home the same day, you will only stay overnight if medically necessary. 6. You  MUST have a responsible adult to drive you home. 7. An adult MUST be with you the first 24 hours after you arrive home. 8. Bring a current list of your medications, and the last time and date medication taken. 9. Bring ID and current insurance cards. 10.Please wear clothes that are easy to get on and off and wear slip-on shoes.  Thank you for allowing [place] to care for you!   --  Invasive Cardiovascular services    Follow-Up: At [facility], you and your health needs are our priority.  As part of our continuing mission to provide you with exceptional heart care, we have created designated Provider Care Teams.  These Care Teams include your primary Cardiologist (physician) and Advanced Practice Providers (APPs -  Physician Assistants and Nurse Practitioners) who all work together to provide you with the care you need, when you need it.  We recommend signing up for the patient portal called "MyChart".  Sign up information is provided on this After Visit Summary.  MyChart is used to connect with patients for Virtual Visits (Telemedicine).  Patients are able to view lab/test results,  encounter notes, upcoming appointments, etc.  Non-urgent messages can be sent to your provider as well.   To learn more about what you can do with MyChart, go to [email or web address].    Your next appointment:   2 week(s) Post Cath  Provider:   Olga Millers, MD

## 2023-08-10 NOTE — H&P (View-Only) (Signed)
 Cardiology Office Note:  .   Date:  08/10/2023  ID:  Timothy [name], DOB 1956/06/04, MRN [id number] PCP: Timothy Aspen, MD  [facility] Providers Cardiologist:  Timothy Millers, MD }   History of Present Illness: .   Timothy [name] is a 68 y.o. male with hx of CAD, Coronary CTA September 2023 showed calcium score 315 which was 73rd percentile.  There was mild disease in the proximal and mid LAD, left circumflex and RCA.  FFR without significant stenosis and hyperlipidemia.  Called our office on 08/07/2023 with complaints of chest pain unrelieved with NTG, with complaints of dyspnea, vomiting and sweating. 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  He works out at [employer], runs and does other training there.  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  He has been under a lot of stress with family as well.  He has been noticing left shoulder pain radiating into his left arm along with chest pressure as well.  The symptoms can occur separately from each other or together.  He denies any significant dyspnea, palpitations, dizziness, he did have some low-grade nausea with recent discomfort in his chest.  He has concerns about progressive CAD with history of diabetes and hypercholesterolemia.  ROS: As above otherwise negative  Studies Reviewed: [name] Kitchen   Coronary CTA 03/03/2022  Coronary Arteries: Right dominant with no anomalies   LM: Calcified plaque distal left main with minimal stenosis.   LAD system: Mixed plaque with mild (25-49%) stenosis in the proximal LAD near the ostium of D2. Soft plaque mid LAD with mild (25-49%) stenosis.   Circumflex system: Mixed plaque mid LCx with mild (25-49%) stenosis.   RCA system: Areas of calcified plaque in the proximal, mid, and distal RCA.  Mild (25-49%) stenosis in the proximal RCA. Mild (1-24%) stenosis in the mid and distal RCA.  1. Coronary artery calciium score 315 Agatston units. This places the patient in the 73rd percentile for age and gender, suggesting intermediate risk for future cardiac events.   2. Nonobstructive cororonary disease. FFR was performed, no lesions were hemodynamically significant.    EKG Interpretation Date/Time:  Thursday August 10 2023 14:55:37 EST Ventricular Rate:  62 PR Interval:  158 QRS Duration:  90 QT Interval:  414 QTC Calculation: 420 R Axis:   45  Text Interpretation: Normal sinus rhythm Normal ECG When compared with ECG of 04-May-2015 14:24, No significant change was found Confirmed by Timothy [name] [phone]) on 08/10/2023 4:29:50 PM    Physical Exam:   VS:  BP 132/68 (BP Location: Right Arm, Patient Position: Sitting, Cuff Size: Normal)   Pulse 62   Ht 6\' 1"  (1.854 m)   Wt 178 lb (80.7 kg)   SpO2 97%   BMI 23.48 kg/m    Wt Readings from Last 3 Encounters:  08/10/23 178 lb (80.7 kg)  07/18/23 173 lb (78.5 kg)  09/16/22 183 lb 3.2 oz (83.1 kg)    GEN: Well nourished, well developed in no acute distress NECK: No JVD; No carotid bruits CARDIAC: RRR, no murmurs, rubs, gallops RESPIRATORY:  Clear to auscultation without rales, wheezing or rhonchi  ABDOMEN: Soft, non-tender, non-distended EXTREMITIES:  No edema; No deformity Doppler pulse on the posterior tibial was 2+.  ASSESSMENT AND PLAN: .   Coronary  artery disease: Coronary CTA in 2023 revealed three-vessel disease which was nonobstructive, with mixed plaque in the LAD systemn between 25 to 49% stenosis in the proximal mLAD near the ostium of the diagonal 2, with soft plaque in the mid LAD with mild 25 to 49% stenosis, circumflex system had mixed plaque in the left circumflex with mild 25 to 49% stenosis the RCA system had areas of calcified plaque in the proximal mid and distal RCA between 25 and 49% stenosis in the  proximal RCA and mild 1 to 24% stenosis in the mid and distal RCA.       Due to symptoms of chest discomfort while exerting himself along with left shoulder pain radiating into the left arm which is not necessarily connected to the discomfort in his chest, we have had a discussion about moving forward with further testing.  He prefers to have a cardiac catheterization for definitive evaluation of coronary anatomy.  He is requesting Dr. Tonny [name].  I have spoken with Dr. Excell Seltzer by phone who has agreed to perform cardiac catheterization on February 18 at 10:30 AM.  Jolaine Click instructions have been provided to the patient along with procedure description.  He is aware that he may end up spending the night if intervention is necessary.  Informed Consent   Shared Decision Making/Informed Consent The risks [stroke (1 in 1000), death (1 in 1000), kidney failure [usually temporary] (1 in 500), bleeding (1 in 200), allergic reaction [possibly serious] (1 in 200)], benefits (diagnostic support and management of coronary artery disease) and alternatives of a cardiac catheterization were discussed in detail with Dr. Delford [name] and he is willing to proceed.  I had previously discussed this case with Dr. Jens [name] by phone who is in agreement with proceeding with catheterization based on patient's symptoms and desires to move forward.     2.  Hyperlipidemia: He has had recent labs completed with a total LDL of 36, total cholesterol 99 triglycerides 56 completed on 05/08/2023.  He is on rosuvastatin 20 mg daily and Zetia 10 mg daily.  LFTs were within normal limits.  Continue low-cholesterol diet, weight management, purposeful exercise.  3.  Type 2 diabetes: He is on Trulicity and metformin.  Metformin will be held postcatheterization and 24 hours precath.  Hemoglobin A1c was 6 on 05/08/2023.    Signed, Bettey Mare. Liborio Nixon, ANP, AACC

## 2023-08-10 NOTE — Progress Notes (Addendum)
 Cardiology Office Note:  .   Date:  08/10/2023  ID:  Timothy [name], DOB 1956/06/04, MRN [id number] PCP: Timothy Aspen, MD  [facility] Providers Cardiologist:  Timothy Millers, MD }   History of Present Illness: .   Timothy [name] is a 68 y.o. male with hx of CAD, Coronary CTA September 2023 showed calcium score 315 which was 73rd percentile.  There was mild disease in the proximal and mid LAD, left circumflex and RCA.  FFR without significant stenosis and hyperlipidemia.  Called our office on 08/07/2023 with complaints of chest pain unrelieved with NTG, with complaints of dyspnea, vomiting and sweating. 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  He works out at [employer], runs and does other training there.  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  He has been under a lot of stress with family as well.  He has been noticing left shoulder pain radiating into his left arm along with chest pressure as well.  The symptoms can occur separately from each other or together.  He denies any significant dyspnea, palpitations, dizziness, he did have some low-grade nausea with recent discomfort in his chest.  He has concerns about progressive CAD with history of diabetes and hypercholesterolemia.  ROS: As above otherwise negative  Studies Reviewed: [name] Kitchen   Coronary CTA 03/03/2022  Coronary Arteries: Right dominant with no anomalies   LM: Calcified plaque distal left main with minimal stenosis.   LAD system: Mixed plaque with mild (25-49%) stenosis in the proximal LAD near the ostium of D2. Soft plaque mid LAD with mild (25-49%) stenosis.   Circumflex system: Mixed plaque mid LCx with mild (25-49%) stenosis.   RCA system: Areas of calcified plaque in the proximal, mid, and distal RCA.  Mild (25-49%) stenosis in the proximal RCA. Mild (1-24%) stenosis in the mid and distal RCA.  1. Coronary artery calciium score 315 Agatston units. This places the patient in the 73rd percentile for age and gender, suggesting intermediate risk for future cardiac events.   2. Nonobstructive cororonary disease. FFR was performed, no lesions were hemodynamically significant.    EKG Interpretation Date/Time:  Thursday August 10 2023 14:55:37 EST Ventricular Rate:  62 PR Interval:  158 QRS Duration:  90 QT Interval:  414 QTC Calculation: 420 R Axis:   45  Text Interpretation: Normal sinus rhythm Normal ECG When compared with ECG of 04-May-2015 14:24, No significant change was found Confirmed by Timothy [name] [phone]) on 08/10/2023 4:29:50 PM    Physical Exam:   VS:  BP 132/68 (BP Location: Right Arm, Patient Position: Sitting, Cuff Size: Normal)   Pulse 62   Ht 6\' 1"  (1.854 m)   Wt 178 lb (80.7 kg)   SpO2 97%   BMI 23.48 kg/m    Wt Readings from Last 3 Encounters:  08/10/23 178 lb (80.7 kg)  07/18/23 173 lb (78.5 kg)  09/16/22 183 lb 3.2 oz (83.1 kg)    GEN: Well nourished, well developed in no acute distress NECK: No JVD; No carotid bruits CARDIAC: RRR, no murmurs, rubs, gallops RESPIRATORY:  Clear to auscultation without rales, wheezing or rhonchi  ABDOMEN: Soft, non-tender, non-distended EXTREMITIES:  No edema; No deformity Doppler pulse on the posterior tibial was 2+.  ASSESSMENT AND PLAN: .   Coronary  artery disease: Coronary CTA in 2023 revealed three-vessel disease which was nonobstructive, with mixed plaque in the LAD systemn between 25 to 49% stenosis in the proximal mLAD near the ostium of the diagonal 2, with soft plaque in the mid LAD with mild 25 to 49% stenosis, circumflex system had mixed plaque in the left circumflex with mild 25 to 49% stenosis the RCA system had areas of calcified plaque in the proximal mid and distal RCA between 25 and 49% stenosis in the  proximal RCA and mild 1 to 24% stenosis in the mid and distal RCA.       Due to symptoms of chest discomfort while exerting himself along with left shoulder pain radiating into the left arm which is not necessarily connected to the discomfort in his chest, we have had a discussion about moving forward with further testing.  He prefers to have a cardiac catheterization for definitive evaluation of coronary anatomy.  He is requesting Dr. Tonny [name].  I have spoken with Dr. Excell Seltzer by phone who has agreed to perform cardiac catheterization on February 18 at 10:30 AM.  Jolaine Click instructions have been provided to the patient along with procedure description.  He is aware that he may end up spending the night if intervention is necessary.  Informed Consent   Shared Decision Making/Informed Consent The risks [stroke (1 in 1000), death (1 in 1000), kidney failure [usually temporary] (1 in 500), bleeding (1 in 200), allergic reaction [possibly serious] (1 in 200)], benefits (diagnostic support and management of coronary artery disease) and alternatives of a cardiac catheterization were discussed in detail with Dr. Delford [name] and he is willing to proceed.  I had previously discussed this case with Dr. Jens [name] by phone who is in agreement with proceeding with catheterization based on patient's symptoms and desires to move forward.     2.  Hyperlipidemia: He has had recent labs completed with a total LDL of 36, total cholesterol 99 triglycerides 56 completed on 05/08/2023.  He is on rosuvastatin 20 mg daily and Zetia 10 mg daily.  LFTs were within normal limits.  Continue low-cholesterol diet, weight management, purposeful exercise.  3.  Type 2 diabetes: He is on Trulicity and metformin.  Metformin will be held postcatheterization and 24 hours precath.  Hemoglobin A1c was 6 on 05/08/2023.    Signed, Timothy [name]. Timothy [name], ANP, AACC

## 2023-08-11 LAB — BASIC METABOLIC PANEL
BUN/Creatinine Ratio: 19 (ref 10–24)
BUN: 20 mg/dL (ref 8–27)
CO2: 26 mmol/L (ref 20–29)
Calcium: 10.1 mg/dL (ref 8.6–10.2)
Chloride: 101 mmol/L (ref 96–106)
Creatinine, Ser: 1.07 mg/dL (ref 0.76–1.27)
Glucose: 80 mg/dL (ref 70–99)
Potassium: 5 mmol/L (ref 3.5–5.2)
Sodium: 140 mmol/L (ref 134–144)
eGFR: 76 mL/min/{1.73_m2} (ref >59)

## 2023-08-11 LAB — CBC
Hematocrit: 42.9 % (ref 37.5–51.0)
Hemoglobin: 14.7 g/dL (ref 13.0–17.7)
MCH: 32 pg (ref 26.6–33.0)
MCHC: 34.3 g/dL (ref 31.5–35.7)
MCV: 93 fL (ref 79–97)
Platelets: 147 10*3/uL — ABNORMAL LOW (ref 150–450)
RBC: 4.6 x10E6/uL (ref 4.14–5.80)
RDW: 12.4 % (ref 11.6–15.4)
WBC: 6.9 10*3/uL (ref 3.4–10.8)

## 2023-08-13 MED ORDER — DIAZEPAM 2 MG PO TABS: 10.0000 mg | ORAL_TABLET | Freq: Once | ORAL | Status: DC

## 2023-08-13 NOTE — Addendum Note (Signed)
 Addended by: Jodelle Gross on: 08/13/2023 02:31 PM   Modules accepted: Orders

## 2023-08-14 ENCOUNTER — Telehealth: Payer: Self-pay

## 2023-08-14 MED ORDER — DIAZEPAM 2 MG PO TABS
10.0000 mg | ORAL_TABLET | Freq: Once | ORAL | Status: AC
Start: 2023-08-15 — End: ?

## 2023-08-14 NOTE — Addendum Note (Signed)
 Addended by: Jodelle Gross on: 08/14/2023 07:37 AM   Modules accepted: Orders

## 2023-08-14 NOTE — Telephone Encounter (Addendum)
 Results viewed by patient via Mychart.----- Message from Joni Reining sent at 08/11/2023  9:35 AM EST ----- I have reviewed the labs. He is cleared to proceed with Cath. He does have evidence of thrombocytosis, which should be evaluated further by PCP.  KL

## 2023-08-15 ENCOUNTER — Ambulatory Visit (HOSPITAL_COMMUNITY)
Admission: RE | Admit: 2023-08-15 | Discharge: 2023-08-15 | Disposition: A | Discharge status: Home / Self Care | Payer: Medicare Other | Attending: Cardiovascular Disease | Admitting: Cardiovascular Disease

## 2023-08-15 ENCOUNTER — Other Ambulatory Visit: Payer: Self-pay

## 2023-08-15 ENCOUNTER — Encounter (HOSPITAL_COMMUNITY)
Admission: RE | Disposition: A | Discharge status: Home / Self Care | Payer: Self-pay | Source: Home / Self Care | Attending: Cardiovascular Disease

## 2023-08-15 ENCOUNTER — Encounter (HOSPITAL_COMMUNITY): Payer: Self-pay | Admitting: Cardiovascular Disease

## 2023-08-15 DIAGNOSIS — E7800 Pure hypercholesterolemia, unspecified: Secondary | ICD-10-CM | POA: Diagnosis not present

## 2023-08-15 DIAGNOSIS — Z7985 Long-term (current) use of injectable non-insulin antidiabetic drugs: Secondary | ICD-10-CM | POA: Insufficient documentation

## 2023-08-15 DIAGNOSIS — I25118 Atherosclerotic heart disease of native coronary artery with other forms of angina pectoris: Secondary | ICD-10-CM | POA: Insufficient documentation

## 2023-08-15 DIAGNOSIS — G4733 Obstructive sleep apnea (adult) (pediatric): Secondary | ICD-10-CM | POA: Diagnosis not present

## 2023-08-15 DIAGNOSIS — Z7984 Long term (current) use of oral hypoglycemic drugs: Secondary | ICD-10-CM | POA: Diagnosis not present

## 2023-08-15 DIAGNOSIS — E119 Type 2 diabetes mellitus without complications: Secondary | ICD-10-CM | POA: Insufficient documentation

## 2023-08-15 DIAGNOSIS — I2089 Other forms of angina pectoris: Secondary | ICD-10-CM

## 2023-08-15 DIAGNOSIS — J45909 Unspecified asthma, uncomplicated: Secondary | ICD-10-CM | POA: Insufficient documentation

## 2023-08-15 DIAGNOSIS — R079 Chest pain, unspecified: Secondary | ICD-10-CM

## 2023-08-15 DIAGNOSIS — Z8546 Personal history of malignant neoplasm of prostate: Secondary | ICD-10-CM | POA: Insufficient documentation

## 2023-08-15 DIAGNOSIS — Z79899 Other long term (current) drug therapy: Secondary | ICD-10-CM | POA: Insufficient documentation

## 2023-08-15 HISTORY — PX: LEFT HEART CATH AND CORONARY ANGIOGRAPHY: CATH118249

## 2023-08-15 LAB — GLUCOSE, CAPILLARY
Glucose-Capillary: 88 mg/dL (ref 70–99)
Glucose-Capillary: 85 mg/dL (ref 70–99)

## 2023-08-15 SURGERY — LEFT HEART CATH AND CORONARY ANGIOGRAPHY
Anesthesia: LOCAL

## 2023-08-15 MED ORDER — SODIUM CHLORIDE 0.9 % WEIGHT BASED INFUSION
3.0000 mL/kg/h | INTRAVENOUS | Status: AC
Start: 2023-08-15 — End: 2023-08-15

## 2023-08-15 MED ORDER — HEPARIN SODIUM (PORCINE) 1000 UNIT/ML IJ SOLN
INTRAMUSCULAR | Status: AC
Start: 2023-08-15 — End: ?
  Filled 2023-08-15: qty 10

## 2023-08-15 MED ORDER — VERAPAMIL HCL 2.5 MG/ML IV SOLN
INTRAVENOUS | Status: DC | PRN
  Administered 2023-08-15: 10 mL via INTRA_ARTERIAL

## 2023-08-15 MED ORDER — SODIUM CHLORIDE 0.9 % WEIGHT BASED INFUSION
1.0000 mL/kg/h | INTRAVENOUS | Status: DC
Start: 2023-08-15 — End: 2023-08-15

## 2023-08-15 MED ORDER — ASPIRIN 81 MG PO CHEW: 81.0000 mg | CHEWABLE_TABLET | ORAL | Status: DC

## 2023-08-15 MED ORDER — VERAPAMIL HCL 2.5 MG/ML IV SOLN
INTRAVENOUS | Status: AC
  Filled 2023-08-15: qty 2

## 2023-08-15 MED ORDER — MIDAZOLAM HCL 2 MG/2ML IJ SOLN
INTRAMUSCULAR | Status: DC | PRN
  Administered 2023-08-15: 2 mg via INTRAVENOUS

## 2023-08-15 MED ORDER — FENTANYL CITRATE (PF) 100 MCG/2ML IJ SOLN
INTRAMUSCULAR | Status: AC
  Filled 2023-08-15: qty 2

## 2023-08-15 MED ORDER — MIDAZOLAM HCL 2 MG/2ML IJ SOLN
INTRAMUSCULAR | Status: AC
  Filled 2023-08-15: qty 2

## 2023-08-15 MED ORDER — LIDOCAINE HCL (PF) 1 % IJ SOLN
INTRAMUSCULAR | Status: DC | PRN
  Administered 2023-08-15: 2 mL

## 2023-08-15 MED ORDER — FENTANYL CITRATE (PF) 100 MCG/2ML IJ SOLN
INTRAMUSCULAR | Status: DC | PRN
  Administered 2023-08-15: 25 ug via INTRAVENOUS

## 2023-08-15 MED ORDER — HEPARIN SODIUM (PORCINE) 1000 UNIT/ML IJ SOLN
INTRAMUSCULAR | Status: DC | PRN
  Administered 2023-08-15: 4000 [IU] via INTRAVENOUS

## 2023-08-15 MED ORDER — HEPARIN (PORCINE) IN NACL 1000-0.9 UT/500ML-% IV SOLN
INTRAVENOUS | Status: DC | PRN
  Administered 2023-08-15: 1000 mL

## 2023-08-15 MED ORDER — IOHEXOL 350 MG/ML SOLN
INTRAVENOUS | Status: DC | PRN
  Administered 2023-08-15: 60 mL

## 2023-08-15 MED ORDER — LIDOCAINE HCL (PF) 1 % IJ SOLN
INTRAMUSCULAR | Status: AC
  Filled 2023-08-15: qty 30

## 2023-08-15 SURGICAL SUPPLY — 11 items
CATH 5FR JL3.5 JR4 ANG PIG MP (CATHETERS) IMPLANT
DEVICE RAD COMP TR BAND LRG (VASCULAR PRODUCTS) IMPLANT
GLIDESHEATH SLEND SS 6F .021 (SHEATH) IMPLANT
GUIDEWIRE INQWIRE 1.5J.035X260 (WIRE) IMPLANT
INQWIRE 1.5J .035X260CM (WIRE) ×1 IMPLANT
KIT SINGLE USE MANIFOLD (KITS) IMPLANT
PACK CARDIAC CATHETERIZATION (CUSTOM PROCEDURE TRAY) ×1 IMPLANT
PROTECTION STATION PRESSURIZED (MISCELLANEOUS) ×1 IMPLANT
SET ATX-X65L (MISCELLANEOUS) IMPLANT
STATION PROTECTION PRESSURIZED (MISCELLANEOUS) IMPLANT
WIRE MICROINTRODUCER 60CM (WIRE) IMPLANT

## 2023-08-15 NOTE — Interval H&P Note (Signed)
 History and Physical Interval Note:  08/15/2023 9:59 AM  Storm Frisk  has presented today for surgery, with the diagnosis of chest pain.  The various methods of treatment have been discussed with the patient and family. After consideration of risks, benefits and other options for treatment, the patient has consented to  Procedure(s): LEFT HEART CATH AND CORONARY ANGIOGRAPHY (N/A) as a surgical intervention.  The patient's history has been reviewed, patient examined, no change in status, stable for surgery.  I have reviewed the patient's chart and labs.  Questions were answered to the patient's satisfaction.     Tonny Bollman

## 2023-08-22 DIAGNOSIS — F3289 Other specified depressive episodes: Secondary | ICD-10-CM | POA: Diagnosis not present

## 2023-08-22 DIAGNOSIS — F605 Obsessive-compulsive personality disorder: Secondary | ICD-10-CM | POA: Diagnosis not present

## 2023-08-22 NOTE — Progress Notes (Unsigned)
  Cardiology Office Note   Date:  08/23/2023  ID:  Timothy [name], Code 1956/05/06, MRN [id number] PCP:  Emilio Aspen, MD [facility] Cardiologist: Olga Millers, MD  Reason for visit: post LHC follow-up  History of Present Illness    Timothy [name] is a 68 y.o. male with a hx of CAD, OSA, asthma, diabetes (A1c 6% in November 2024) and prostate cancer.  He called our office on 08/07/2023 with complaints of chest pain unrelieved with NTG, with complaints of dyspnea, vomiting and sweating.  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  He underwent left heart catheterization with Dr. Excell Seltzer on August 15, 2023.  Cath showed widely patent coronary arteries with no significant stenosis, minimal plaquing.  EF 55 to 65%, no wall motion abnormalities.  Today, he states he is doing well.  He thinks his discomfort was coming from his left shoulder.  He got concerned when the left shoulder pain started radiating to his arm and chest.  He is planning to see orthopedics.  He also mentions some family stress.  He continues to exercise at [facility] 3-4 times per week.  Today, He denies chest pain, shortness of breath, syncope, orthopnea, PND or significant pedal edema.  He has no issues with his medications.  He states his lipid therapy is prescribed by his PCP.  No issues with cath access site.   Objective / Physical Exam   Vital signs:  BP (!) 96/50   Pulse 65   Ht 6\' 1"  (1.854 m)   Wt 176 lb (79.8 kg)   SpO2 98%   BMI 23.22 kg/m     GEN: No acute distress NECK: No carotid bruits CARDIAC: RRR, no murmurs RESPIRATORY:  Clear to auscultation without rales, wheezing or rhonchi  EXTREMITIES: No edema  Assessment and Plan   Precordial pain, improved Coronary artery disease -LHC 08/15/2023: widely patent coronary arteries with no significant stenosis, minimal plaquing.  -Continue aspirin 81 mg daily and Crestor 20  mg daily. -Pain likely 2/2 shoulder issues - he is following up with orthopedics.  Hyperlipidemia -Lipids November 2024 with LDL 36 -Continue Crestor 20 mg daily and Zetia 10 mg daily. -Recommend cholesterol lowering diets - Mediterranean diet, DASH diet, vegetarian diet, low-carbohydrate diet and avoidance of trans fats.  Discussed healthier choice substitutes.  Nuts, high-fiber foods, and fiber supplements may also improve lipids.    Disposition - Follow-up in 1 year with Dr. Jens Som.   Signed, Cannon Kettle, PA-C  08/23/2023 [facility]

## 2023-08-23 ENCOUNTER — Ambulatory Visit: Payer: Medicare Other | Attending: Physician Assistant | Admitting: Physician Assistant

## 2023-08-23 VITALS — BP 96/50 | HR 65 | Ht 73.0 in | Wt 176.0 lb

## 2023-08-23 DIAGNOSIS — I2510 Atherosclerotic heart disease of native coronary artery without angina pectoris: Secondary | ICD-10-CM | POA: Diagnosis not present

## 2023-08-23 DIAGNOSIS — E7800 Pure hypercholesterolemia, unspecified: Secondary | ICD-10-CM

## 2023-08-23 DIAGNOSIS — R072 Precordial pain: Secondary | ICD-10-CM

## 2023-08-23 NOTE — Patient Instructions (Signed)
Medication Instructions:  No Changes *If you need a refill on your cardiac medications before your next appointment, please call your pharmacy*   Lab Work: No labs If you have labs (blood work) drawn today and your tests are completely normal, you will receive your results only by: MyChart Message (if you have MyChart) OR A paper copy in the mail If you have any lab test that is abnormal or we need to change your treatment, we will call you to review the results.   Testing/Procedures: No Testing   Follow-Up: At [facility], you and your health needs are our priority.  As part of our continuing mission to provide you with exceptional heart care, we have created designated Provider Care Teams.  These Care Teams include your primary Cardiologist (physician) and Advanced Practice Providers (APPs -  Physician Assistants and Nurse Practitioners) who all work together to provide you with the care you need, when you need it.  We recommend signing up for the patient portal called "MyChart".  Sign up information is provided on this After Visit Summary.  MyChart is used to connect with patients for Virtual Visits (Telemedicine).  Patients are able to view lab/test results, encounter notes, upcoming appointments, etc.  Non-urgent messages can be sent to your provider as well.   To learn more about what you can do with MyChart, go to [email or web address].    Your next appointment:   1 year(s)  Provider:   Olga Millers, MD

## 2023-08-24 ENCOUNTER — Other Ambulatory Visit (HOSPITAL_COMMUNITY): Payer: Self-pay

## 2023-08-24 MED ORDER — AZITHROMYCIN 250 MG PO TABS
ORAL_TABLET | ORAL | 0 refills | Status: AC
  Filled 2023-08-24 (×2): qty 6, 5d supply, fill #0

## 2023-08-24 MED ORDER — BENZONATATE 100 MG PO CAPS
100.0000 mg | ORAL_CAPSULE | Freq: Three times a day (TID) | ORAL | 0 refills | Status: DC | PRN
  Filled 2023-08-24: qty 180, 30d supply, fill #0

## 2023-08-28 ENCOUNTER — Other Ambulatory Visit (HOSPITAL_BASED_OUTPATIENT_CLINIC_OR_DEPARTMENT_OTHER): Payer: Self-pay

## 2023-08-29 DIAGNOSIS — F3289 Other specified depressive episodes: Secondary | ICD-10-CM | POA: Diagnosis not present

## 2023-08-29 DIAGNOSIS — F605 Obsessive-compulsive personality disorder: Secondary | ICD-10-CM | POA: Diagnosis not present

## 2023-08-30 DIAGNOSIS — K080 Exfoliation of teeth due to systemic causes: Secondary | ICD-10-CM | POA: Diagnosis not present

## 2023-09-05 DIAGNOSIS — F3289 Other specified depressive episodes: Secondary | ICD-10-CM | POA: Diagnosis not present

## 2023-09-05 DIAGNOSIS — M7542 Impingement syndrome of left shoulder: Secondary | ICD-10-CM | POA: Diagnosis not present

## 2023-09-05 DIAGNOSIS — F605 Obsessive-compulsive personality disorder: Secondary | ICD-10-CM | POA: Diagnosis not present

## 2023-09-06 ENCOUNTER — Other Ambulatory Visit (HOSPITAL_BASED_OUTPATIENT_CLINIC_OR_DEPARTMENT_OTHER): Payer: Self-pay

## 2023-09-11 ENCOUNTER — Other Ambulatory Visit (HOSPITAL_BASED_OUTPATIENT_CLINIC_OR_DEPARTMENT_OTHER): Payer: Self-pay

## 2023-09-11 DIAGNOSIS — S61451A Open bite of right hand, initial encounter: Secondary | ICD-10-CM | POA: Diagnosis not present

## 2023-09-11 DIAGNOSIS — R600 Localized edema: Secondary | ICD-10-CM | POA: Diagnosis not present

## 2023-09-11 DIAGNOSIS — M79641 Pain in right hand: Secondary | ICD-10-CM | POA: Diagnosis not present

## 2023-09-11 DIAGNOSIS — W540XXA Bitten by dog, initial encounter: Secondary | ICD-10-CM | POA: Diagnosis not present

## 2023-09-11 DIAGNOSIS — Z23 Encounter for immunization: Secondary | ICD-10-CM | POA: Diagnosis not present

## 2023-09-11 MED ORDER — AMOXICILLIN-POT CLAVULANATE 875-125 MG PO TABS
1.0000 | ORAL_TABLET | Freq: Two times a day (BID) | ORAL | 0 refills | Status: AC
  Filled 2023-09-11: qty 20, 10d supply, fill #0

## 2023-09-12 DIAGNOSIS — F605 Obsessive-compulsive personality disorder: Secondary | ICD-10-CM | POA: Diagnosis not present

## 2023-09-12 DIAGNOSIS — F3289 Other specified depressive episodes: Secondary | ICD-10-CM | POA: Diagnosis not present

## 2023-09-13 DIAGNOSIS — F411 Generalized anxiety disorder: Secondary | ICD-10-CM | POA: Diagnosis not present

## 2023-09-13 DIAGNOSIS — F605 Obsessive-compulsive personality disorder: Secondary | ICD-10-CM | POA: Diagnosis not present

## 2023-09-13 DIAGNOSIS — F32A Depression, unspecified: Secondary | ICD-10-CM | POA: Diagnosis not present

## 2023-09-13 DIAGNOSIS — Z79899 Other long term (current) drug therapy: Secondary | ICD-10-CM | POA: Diagnosis not present

## 2023-09-23 ENCOUNTER — Other Ambulatory Visit: Payer: Self-pay | Admitting: Cardiology

## 2023-09-23 DIAGNOSIS — E7800 Pure hypercholesterolemia, unspecified: Secondary | ICD-10-CM

## 2023-09-25 ENCOUNTER — Other Ambulatory Visit (HOSPITAL_BASED_OUTPATIENT_CLINIC_OR_DEPARTMENT_OTHER): Payer: Self-pay

## 2023-09-25 ENCOUNTER — Encounter: Payer: Self-pay | Admitting: Certified Registered Nurse Anesthetist

## 2023-09-25 MED ORDER — EZETIMIBE 10 MG PO TABS
10.0000 mg | ORAL_TABLET | Freq: Every day | ORAL | 3 refills | Status: AC
  Filled 2023-09-25: qty 90, 90d supply, fill #0
  Filled 2023-12-23 (×2): qty 90, 90d supply, fill #1
  Filled 2024-03-15: qty 90, 90d supply, fill #2
  Filled 2024-05-14 – 2024-06-22 (×3): qty 90, 90d supply, fill #3

## 2023-09-26 ENCOUNTER — Telehealth: Payer: Self-pay | Admitting: Internal Medicine

## 2023-09-26 NOTE — Telephone Encounter (Signed)
 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  Patient indicated that he had taken Trulicity on Sunday 09/24/23 by mistake, and last dose should have been on 09/20/23.  Patient was instructed that we hold those for 7 days prior to colonoscopy and was aware but Patient would like to know if we can proceed with colonoscopy scheduled on 09/28/23.  Instructed patient that I would check with Anesthesia but he may need to be rescheduled for the colonoscopy.

## 2023-09-26 NOTE — Telephone Encounter (Signed)
 Spoke with the patient and let him know he was cleared for the procedure on 09/28/23 per anesthesia.

## 2023-09-26 NOTE — Telephone Encounter (Signed)
 PT is requesting a call back to discuss prep instructions. He is not clear on the "day of" prep. Please advise.

## 2023-09-27 NOTE — Progress Notes (Unsigned)
 [facility] History and Physical   Primary Care Physician:  Emilio Aspen, MD   Reason for Procedure:   Hx colon polyps  Plan:    colonoscopy     HPI: Timothy [name] is a 68 y.o. male here for a surveillance colonoscopy. Prior exams w/ sg looping requiring abdominal pressure to pass scope.  2021 colonoscopy w/ 7 adenomas max 6 mm - prior 2018 3 diminutive adenomas  Past Medical History:  Diagnosis Date   Acute meniscal tear of knee    Anxiety    CAD (coronary artery disease)    Cancer (HCC)    Cataract    Environmental allergies    GERD (gastroesophageal reflux disease)    History of basal cell carcinoma (BCC) excision    History of detached retina repair    left s/p laser 2019;  right s/p surgical repair 02/ 2015   History of malignant melanoma of skin    04-26-2019 per pt fall 2019 excision from back, localized, and no recurrence   History of prostate cancer urologist --- dr Laverle Patter   dx 2016-- Stage T1c, Gleason 3+3;  05-11-2015 s/p  radical prostatectomy  (04-26-2019 per pt psa undetectable)   Hx of adenomatous colonic polyps 01/18/2017   Hyperlipidemia    Low platelet count (HCC)    bruising   Mild obstructive sleep apnea    no longer has to use CPAP wt. loss (pt retested 10-30-2014 epic)   OA (osteoarthritis)    Sleep apnea    oral appliance    Thrombocytopenia, unspecified (HCC)    04-26-2019  per pt baseline plt count 150   Type 2 diabetes mellitus (HCC)    followed by pcp  (04-26-2019 check's cbg daily,  fasting cbg-- 110-120;  per pt last A1c 5.8)   Wears glasses     Past Surgical History:  Procedure Laterality Date   CATARACT EXTRACTION W/ INTRAOCULAR LENS  IMPLANT, BILATERAL  01/2018   COLONOSCOPY  last one 01-12-2017   GAS INSERTION Right 08/19/2013   Procedure: INSERTION OF GAS;  Surgeon: Edmon Crape, MD;  Location: [facility] OR;  Service: Ophthalmology;  Laterality: Right;  SF6   KNEE ARTHROSCOPY Left 05/02/2019   Procedure:  ARTHROSCOPY KNEE evaluation under anesthesia debridement partial medial meniscectomy chondroplasty;  Surgeon: Eugenia Mcalpine, MD;  Location: [facility];  Service: Orthopedics;  Laterality: Left;  Anesthesia - Femoral nerve block/knee block/IV sedation   LEFT HEART CATH AND CORONARY ANGIOGRAPHY N/A 08/15/2023   Procedure: LEFT HEART CATH AND CORONARY ANGIOGRAPHY;  Surgeon: Tonny Bollman, MD;  Location: [facility] INVASIVE CV LAB;  Service: Cardiovascular;  Laterality: N/A;   MASS EXCISION Left 07/08/2014   Procedure: EXCISION OF KERATOACANTHOMA LEFT LOWER LEG, AND EXCISION OF BASIL CELL CARCINOMA FROM NECK;  Surgeon: Darnell Level, MD;  Location: [facility];  Service: General;  Laterality: Left;  left lower leg and posterior neck   NASAL SEPTOPLASTY W/ TURBINOPLASTY  12-13-2001  dr byers @[facility]    PROSTATE BIOPSY  11/11/2014   REPLACEMENT TOTAL KNEE     ROBOT ASSISTED LAPAROSCOPIC RADICAL PROSTATECTOMY N/A 05/11/2015   Procedure: ROBOTIC ASSISTED LAPAROSCOPIC RADICAL PROSTATECTOMY LEVEL 1;  Surgeon: Heloise Purpura, MD;  Location: [facility] ORS;  Service: Urology;  Laterality: N/A;   SCLERAL BUCKLE WITH CRYO Right 08/19/2013   Procedure: SCLERAL BUCKLE WITH CRYOPEXY;  Surgeon: Edmon Crape, MD;  Location: [facility] OR;  Service: Ophthalmology;  Laterality: Right;   TONSILLECTOMY AND ADENOIDECTOMY  chiuld  Prior to Admission medications   Medication Sig Start Date End Date Taking? Authorizing Provider  acetaminophen (TYLENOL) 500 MG tablet Take 500 mg by mouth every 6 (six) hours as needed for moderate pain (pain score 4-6).    [provider]  albuterol (VENTOLIN HFA) 108 (90 Base) MCG/ACT inhaler Inhale 2 puffs into the lungs every 6 (six) hours as needed for wheezing or shortness of breath. 12/13/22   Elson Areas, PA-C  aspirin EC 81 MG tablet Take 81 mg by mouth daily. Swallow whole.    [provider]  atovaquone-proguanil (MALARONE) 250-100 MG TABS tablet Take 1  tablet by mouth daily ,start 1 to 2 days prior to entering a malaria-endemic area, continue throughout the stay and for 7 days after returning 07/28/23     Azelastine HCl 0.15 % SOLN Place 1-2 sprays into the nose 2 (two) times daily as needed. 12/21/20     benzonatate (TESSALON) 100 MG capsule Take 1-2 capsules (100-200 mg total) by mouth 3 (three) times daily as needed. 08/24/23     blood glucose meter kit and supplies Use to check blood sugar 1 to 2 times daily 12/15/21     cetirizine (ZYRTEC) 10 MG tablet Take 10 mg by mouth daily.    [provider]  ciprofloxacin (CIPRO) 750 MG tablet Take 1 tablet (750 mg total) by mouth as a one time dose for travelers diarrhea. 07/28/23     cloNIDine (CATAPRES) 0.2 MG tablet Take 1 tablet (0.2 mg total) by mouth at bedtime. Patient taking differently: Take 0.2 mg by mouth at bedtime as needed (sleep). 08/07/23     Dulaglutide (TRULICITY) 0.75 MG/0.5ML SOAJ Inject 0.75 mg into the skin once a week. 06/20/23     ezetimibe (ZETIA) 10 MG tablet Take 1 tablet (10 mg total) by mouth daily. 09/25/23   Lewayne Bunting, MD  famotidine (PEPCID) 10 MG tablet Take 10 mg by mouth daily as needed for heartburn or indigestion.    [provider]  fluticasone (FLONASE) 50 MCG/ACT nasal spray Place 2 sprays into both nostrils daily.    [provider]  glucose blood (ONETOUCH VERIO) test strip Check blood glucose up to 3 times daily 05/15/23     lamoTRIgine (LAMICTAL) 100 MG tablet Take 2 tablets (200 mg total) by mouth daily. 05/29/23     Lancets (ONETOUCH DELICA PLUS LANCET33G) MISC Use to test blood sugar 1 to 2 times per day 12/15/21     metFORMIN (GLUCOPHAGE) 500 MG tablet Take 2 tablets (1,000 mg total) by mouth 2 (two) times daily as directed 12/20/22     Multiple Vitamins-Minerals (CENTRUM SILVER 50+MEN) TABS Take 1 tablet by mouth daily.    [provider]  PEG 3350-KCl-NaCl-NaSulf-MgSul (SUFLAVE) 178.7 g SOLR Take 1 kit by mouth as  directed. 07/18/23   Iva Boop, MD  rosuvastatin (CRESTOR) 20 MG tablet Take 1 tablet (20 mg total) by mouth daily. 05/17/23     typhoid (VIVOTIF) DR capsule Take 1 capsule by mouth every other day on alternate days (day 1, 3, 5, and 7) for a total of 4 doses; all doses should be complete at least 1 week prior to potential exposure 07/28/23       Current Outpatient Medications  Medication Sig Dispense Refill   acetaminophen (TYLENOL) 500 MG tablet Take 500 mg by mouth every 6 (six) hours as needed for moderate pain (pain score 4-6).     albuterol (VENTOLIN HFA) 108 (90 Base)  MCG/ACT inhaler Inhale 2 puffs into the lungs every 6 (six) hours as needed for wheezing or shortness of breath. 8 g 2   aspirin EC 81 MG tablet Take 81 mg by mouth daily. Swallow whole.     atovaquone-proguanil (MALARONE) 250-100 MG TABS tablet Take 1 tablet by mouth daily ,start 1 to 2 days prior to entering a malaria-endemic area, continue throughout the stay and for 7 days after returning 18 tablet 0   Azelastine HCl 0.15 % SOLN Place 1-2 sprays into the nose 2 (two) times daily as needed. 30 mL 11   benzonatate (TESSALON) 100 MG capsule Take 1-2 capsules (100-200 mg total) by mouth 3 (three) times daily as needed. 180 capsule 0   blood glucose meter kit and supplies Use to check blood sugar 1 to 2 times daily 1 each 0   cetirizine (ZYRTEC) 10 MG tablet Take 10 mg by mouth daily.     ciprofloxacin (CIPRO) 750 MG tablet Take 1 tablet (750 mg total) by mouth as a one time dose for travelers diarrhea. 3 tablet 0   cloNIDine (CATAPRES) 0.2 MG tablet Take 1 tablet (0.2 mg total) by mouth at bedtime. (Patient taking differently: Take 0.2 mg by mouth at bedtime as needed (sleep).) 30 tablet 2   Dulaglutide (TRULICITY) 0.75 MG/0.5ML SOAJ Inject 0.75 mg into the skin once a week. 6 mL 3   ezetimibe (ZETIA) 10 MG tablet Take 1 tablet (10 mg total) by mouth daily. 90 tablet 3   famotidine (PEPCID) 10 MG tablet Take 10 mg by mouth  daily as needed for heartburn or indigestion.     fluticasone (FLONASE) 50 MCG/ACT nasal spray Place 2 sprays into both nostrils daily.     glucose blood (ONETOUCH VERIO) test strip Check blood glucose up to 3 times daily 300 strip 3   lamoTRIgine (LAMICTAL) 100 MG tablet Take 2 tablets (200 mg total) by mouth daily. 180 tablet 3   Lancets (ONETOUCH DELICA PLUS LANCET33G) MISC Use to test blood sugar 1 to 2 times per day 200 each 3   metFORMIN (GLUCOPHAGE) 500 MG tablet Take 2 tablets (1,000 mg total) by mouth 2 (two) times daily as directed 360 tablet 3   Multiple Vitamins-Minerals (CENTRUM SILVER 50+MEN) TABS Take 1 tablet by mouth daily.     PEG 3350-KCl-NaCl-NaSulf-MgSul (SUFLAVE) 178.7 g SOLR Take 1 kit by mouth as directed. 1 each 0   rosuvastatin (CRESTOR) 20 MG tablet Take 1 tablet (20 mg total) by mouth daily. 90 tablet 3   typhoid (VIVOTIF) DR capsule Take 1 capsule by mouth every other day on alternate days (day 1, 3, 5, and 7) for a total of 4 doses; all doses should be complete at least 1 week prior to potential exposure 4 capsule 0   Current Facility-Administered Medications  Medication Dose Route Frequency Provider Last Rate Last Admin   0.9 %  sodium chloride infusion  500 mL Intravenous Once Meryl Dare, MD       diazepam (VALIUM) tablet 10 mg  10 mg Oral Once Jodelle Gross, NP        Allergies as of 09/28/2023 - Review Complete 09/25/2023  Allergen Reaction Noted   Sporanox [itraconazole] Rash 08/19/2013   Ace inhibitors Cough 03/20/2007    Family History  Problem Relation Age of Onset   Kidney Stones Mother    Dementia Father    Bowel Disease Father        ischemic bowl disease  Colon cancer Neg Hx    Colon polyps Neg Hx    Esophageal cancer Neg Hx    Rectal cancer Neg Hx    Stomach cancer Neg Hx     Social History   Socioeconomic History   Marital status: Married    Spouse name: Not on file   Number of children: 2   Years of education: Not on  file   Highest education level: Not on file  Occupational History   Occupation: physician  Tobacco Use   Smoking status: Never   Smokeless tobacco: Never  Vaping Use   Vaping status: Never Used  Substance and Sexual Activity   Alcohol use: Not Currently    Comment: 1-2 wine per night   Drug use: Never   Sexual activity: Not on file  Other Topics Concern   Not on file  Social History Narrative   Not on file   Social Drivers of Health   Financial Resource Strain: Not on file  Food Insecurity: Not on file  Transportation Needs: Not on file  Physical Activity: Not on file  Stress: Not on file  Social Connections: Not on file  Intimate Partner Violence: Not on file    Review of Systems: Positive for *** All other review of systems negative except as mentioned in the HPI.  Physical Exam: Vital signs There were no vitals taken for this visit.  General:   Alert,  Well-developed, well-nourished, pleasant and cooperative in NAD Lungs:  Clear throughout to auscultation.   Heart:  Regular rate and rhythm; no murmurs, clicks, rubs,  or gallops. Abdomen:  Soft, nontender and nondistended. Normal bowel sounds.   Neuro/Psych:  Alert and cooperative. Normal mood and affect. A and O x 3   @Keaundre Thelin  Sena Slate, MD, [facility] [phone] (pager) 09/27/2023 8:31 PM@

## 2023-09-28 ENCOUNTER — Ambulatory Visit: Payer: Medicare Other | Admitting: Internal Medicine

## 2023-09-28 ENCOUNTER — Encounter: Payer: Self-pay | Admitting: Internal Medicine

## 2023-09-28 VITALS — BP 113/79 | HR 61 | Temp 97.1°F | Resp 16 | Ht 73.0 in | Wt 173.0 lb

## 2023-09-28 DIAGNOSIS — Z860100 Personal history of colon polyps, unspecified: Secondary | ICD-10-CM

## 2023-09-28 DIAGNOSIS — Z9079 Acquired absence of other genital organ(s): Secondary | ICD-10-CM

## 2023-09-28 DIAGNOSIS — D124 Benign neoplasm of descending colon: Secondary | ICD-10-CM

## 2023-09-28 DIAGNOSIS — D123 Benign neoplasm of transverse colon: Secondary | ICD-10-CM | POA: Diagnosis not present

## 2023-09-28 DIAGNOSIS — Z860101 Personal history of adenomatous and serrated colon polyps: Secondary | ICD-10-CM

## 2023-09-28 DIAGNOSIS — Z1211 Encounter for screening for malignant neoplasm of colon: Secondary | ICD-10-CM | POA: Diagnosis not present

## 2023-09-28 DIAGNOSIS — K6289 Other specified diseases of anus and rectum: Secondary | ICD-10-CM

## 2023-09-28 DIAGNOSIS — K5730 Diverticulosis of large intestine without perforation or abscess without bleeding: Secondary | ICD-10-CM | POA: Diagnosis not present

## 2023-09-28 MED ORDER — SODIUM CHLORIDE 0.9 % IV SOLN: 500.0000 mL | Freq: Once | INTRAVENOUS | Status: DC

## 2023-09-28 NOTE — Patient Instructions (Addendum)
 There were 5 diminutive polyps removed (each < 5 mm). Some diverticulosis.  You have a redundant colon which makes performing the colonoscopy more difficult but we were successful.  I will let you know pathology results and when to have another routine colonoscopy by mail and/or My Chart.  I appreciate the opportunity to care for you. Iva Boop, MD, FACG   YOU HAD AN ENDOSCOPIC PROCEDURE TODAY AT THE [facility]:   Refer to the procedure report that was given to you for any specific questions about what was found during the examination.  If the procedure report does not answer your questions, please call your gastroenterologist to clarify.  If you requested that your care partner not be given the details of your procedure findings, then the procedure report has been included in a sealed envelope for you to review at your convenience later.  YOU SHOULD EXPECT: Some feelings of bloating in the abdomen. Passage of more gas than usual.  Walking can help get rid of the air that was put into your GI tract during the procedure and reduce the bloating. If you had a lower endoscopy (such as a colonoscopy or flexible sigmoidoscopy) you may notice spotting of blood in your stool or on the toilet paper. If you underwent a bowel prep for your procedure, you may not have a normal bowel movement for a few days.  Please Note:  You might notice some irritation and congestion in your nose or some drainage.  This is from the oxygen used during your procedure.  There is no need for concern and it should clear up in a day or so.  SYMPTOMS TO REPORT IMMEDIATELY:  Following lower endoscopy (colonoscopy or flexible sigmoidoscopy):  Excessive amounts of blood in the stool  Significant tenderness or worsening of abdominal pains  Swelling of the abdomen that is new, acute  Fever of 100F or higher   For urgent or emergent issues, a gastroenterologist can be reached at any hour by calling [phone]  [phone]. Do not use MyChart messaging for urgent concerns.    DIET:  We do recommend a small meal at first, but then you may proceed to your regular diet.  Drink plenty of fluids but you should avoid alcoholic beverages for 24 hours.  ACTIVITY:  You should plan to take it easy for the rest of today and you should NOT DRIVE or use heavy machinery until tomorrow (because of the sedation medicines used during the test).    FOLLOW UP: Our staff will call the number listed on your records the next business day following your procedure.  We will call around 7:15- 8:00 am to check on you and address any questions or concerns that you may have regarding the information given to you following your procedure. If we do not reach you, we will leave a message.     If any biopsies were taken you will be contacted by phone or by letter within the next 1-3 weeks.  Please call us at [phone] if you have not heard about the biopsies in 3 weeks.    SIGNATURES/CONFIDENTIALITY: You and/or your care partner have signed paperwork which will be entered into your electronic medical record.  These signatures attest to the fact that that the information above on your After Visit Summary has been reviewed and is understood.  Full responsibility of the confidentiality of this discharge information lies with you and/or your care-partner.  Diverticulosis  Diverticulosis is when small pouches called  diverticula form in the wall of the colon. The colon is where water is absorbed. It is also where poop (stool) is formed. The pouches form when the inside layer of the colon pushes through weak spots in the outer layers of the colon. You may have a few pouches or many of them. In most cases, the pouches do not cause problems. If they become inflamed or infected, you may have a condition called diverticulitis. What are the causes? The cause of this condition is not known. What increases the risk? You are more likely to get  this condition if: You are older than 69 years of age. You do not eat enough fiber or you get constipated a lot. You are overweight. You do not get enough exercise. You smoke. You take over-the-counter pain medicines. You have a family history of the condition. What are the signs or symptoms? In most people, there are no symptoms. If you do have symptoms, they may include: Bloating. Stomach cramps. Constipation or diarrhea. Pain in the lower left side of your abdomen. How is this diagnosed? This condition is often diagnosed during an exam for other colon problems. It may be diagnosed when you have: A colonoscopy. This is when a tube with a camera on the end is used to look at your colon. A barium enema. This is an X-ray exam that uses dye to look at your colon. A CT scan. How is this treated? You may not need treatment. Your health care provider will tell you what you can do at home to help prevent problems. You may need treatment if you have symptoms or if you have had diverticulitis before. You may be told to: Eat a high-fiber diet. Take medicine to relax your colon. Lose weight. Follow these instructions at home: Medicines Take over-the-counter and prescription medicines only as told by your provider. If told, take a fiber supplement or probiotic. Managing constipation Your condition may cause constipation. To prevent or treat constipation, you may need to: Drink enough fluid to keep your pee (urine) pale yellow. Take over-the-counter or prescription medicines. Eat foods that are high in fiber, such as beans, whole grains, and fresh fruits and vegetables. Limit foods that are high in fat and processed sugars, such as fried or sweet foods. Try not to strain when you poop. Contact a health care provider if: Your symptoms get worse all of a sudden. You have pain in your abdomen that gets worse. You have bloating or stomach cramps. You continue to have frequent  constipation. You have a fever or chills. You vomit. Your poop is bloody, black, or tarry. This information is not intended to replace advice given to you by your health care provider. Make sure you discuss any questions you have with your health care provider. Document Revised: 03/10/2022 Document Reviewed: 03/10/2022 Elsevier Patient Education  2024 [employer]   Colon Polyps  Colon polyps are tissue growths inside the colon, which is part of the large intestine. They are one of the types of polyps that can grow in the body. A polyp may be a round bump or a mushroom-shaped growth. You could have one polyp or more than one. Most colon polyps are noncancerous (benign). However, some colon polyps can become cancerous over time. Finding and removing the polyps early can help prevent this. What are the causes? The exact cause of colon polyps is not known. What increases the risk? The following factors may make you more likely to develop this condition: Having  a family history of colorectal cancer or colon polyps. Being older than 69 years of age. Being younger than 69 years of age and having a significant family history of colorectal cancer or colon polyps or a genetic condition that puts you at higher risk of getting colon polyps. Having inflammatory bowel disease, such as ulcerative colitis or Crohn's disease. Having certain conditions passed from parent to child (hereditary conditions), such as: Familial adenomatous polyposis (FAP). Lynch syndrome. Turcot syndrome. Peutz-Jeghers syndrome. MUTYH-associated polyposis (MAP). Being overweight. Certain lifestyle factors. These include smoking cigarettes, drinking too much alcohol, not getting enough exercise, and eating a diet that is high in fat and red meat and low in fiber. Having had childhood cancer that was treated with radiation of the abdomen. What are the signs or symptoms? Many times, there are no symptoms. If you have  symptoms, they may include: Blood coming from the rectum during a bowel movement. Blood in the stool (feces). The blood may be bright red or very dark in color. Pain in the abdomen. A change in bowel habits, such as constipation or diarrhea. How is this diagnosed? This condition is diagnosed with a colonoscopy. This is a procedure in which a lighted, flexible scope is inserted into the opening between the buttocks (anus) and then passed into the colon to examine the area. Polyps are sometimes found when a colonoscopy is done as part of routine cancer screening tests. How is this treated? This condition is treated by removing any polyps that are found. Most polyps can be removed during a colonoscopy. Those polyps will then be tested for cancer. Additional treatment may be needed depending on the results of testing. Follow these instructions at home: Eating and drinking  Eat foods that are high in fiber, such as fruits, vegetables, and whole grains. Eat foods that are high in calcium and vitamin D, such as milk, cheese, yogurt, eggs, liver, fish, and broccoli. Limit foods that are high in fat, such as fried foods and desserts. Limit the amount of red meat, precooked or cured meat, or other processed meat that you eat, such as hot dogs, sausages, bacon, or meat loaves. Limit sugary drinks. Lifestyle Maintain a healthy weight, or lose weight if recommended by your health care provider. Exercise every day or as told by your health care provider. Do not use any products that contain nicotine or tobacco, such as cigarettes, e-cigarettes, and chewing tobacco. If you need help quitting, ask your health care provider. Do not drink alcohol if: Your health care provider tells you not to drink. You are pregnant, may be pregnant, or are planning to become pregnant. If you drink alcohol: Limit how much you use to: 0-1 drink a day for women. 0-2 drinks a day for men. Know how much alcohol is in your  drink. In the [place] one drink equals one 12 oz bottle of beer (355 mL), one 5 oz glass of wine (148 mL), or one 1 oz glass of hard liquor (44 mL). General instructions Take over-the-counter and prescription medicines only as told by your health care provider. Keep all follow-up visits. This is important. This includes having regularly scheduled colonoscopies. Talk to your health care provider about when you need a colonoscopy. Contact a health care provider if: You have new or worsening bleeding during a bowel movement. You have new or increased blood in your stool. You have a change in bowel habits. You lose weight for no known reason. Summary Colon polyps are tissue growths inside  the colon, which is part of the large intestine. They are one type of polyp that can grow in the body. Most colon polyps are noncancerous (benign), but some can become cancerous over time. This condition is diagnosed with a colonoscopy. This condition is treated by removing any polyps that are found. Most polyps can be removed during a colonoscopy. This information is not intended to replace advice given to you by your health care provider. Make sure you discuss any questions you have with your health care provider. Document Revised: 10/02/2019 Document Reviewed: 10/02/2019 Elsevier Patient Education  2024 [employer].

## 2023-09-28 NOTE — Op Note (Signed)
 [facility] Patient Name: Timothy [name] Procedure Date: 09/28/2023 11:18 AM MRN: [id number] Endoscopist: Iva Boop , MD, [id number] Age: 68 Referring MD:  Date of Birth: 09/24/1955 Gender: Male Account #: [id number] Procedure:                Colonoscopy Indications:              Surveillance: Personal history of adenomatous                            polyps on last colonoscopy 3 years ago Medicines:                Monitored Anesthesia Care Procedure:                Pre-Anesthesia Assessment:                           - Prior to the procedure, a History and Physical                            was performed, and patient medications and                            allergies were reviewed. The patient's tolerance of                            previous anesthesia was also reviewed. The risks                            and benefits of the procedure and the sedation                            options and risks were discussed with the patient.                            All questions were answered, and informed consent                            was obtained. Prior Anticoagulants: The patient has                            taken no anticoagulant or antiplatelet agents. ASA                            Grade Assessment: II - A patient with mild systemic                            disease. After reviewing the risks and benefits,                            the patient was deemed in satisfactory condition to                            undergo the procedure.  After obtaining informed consent, the colonoscope                            was passed under direct vision. Throughout the                            procedure, the patient's blood pressure, pulse, and                            oxygen saturations were monitored continuously. The                            Olympus Scope [id number] was introduced through the                            anus and advanced to  the the cecum, identified by                            appendiceal orifice and ileocecal valve. The                            colonoscopy was performed with difficulty due to a                            redundant colon. Successful completion of the                            procedure was aided by applying abdominal pressure                            and using abdominal binder. The patient tolerated                            the procedure well. The quality of the bowel                            preparation was adequate. The bowel preparation                            used was SUFLAVE via split dose instruction. The                            ileocecal valve, appendiceal orifice, and rectum                            were photographed. Scope In: 11:26:31 AM Scope Out: 12:07:31 PM Scope Withdrawal Time: 0 hours 30 minutes 48 seconds  Total Procedure Duration: 0 hours 41 minutes 0 seconds  Findings:                 The digital exam findings include surgically absent                            prostate.  Five sessile polyps were found in the descending                            colon and transverse colon. The polyps were                            diminutive in size. These polyps were removed with                            a cold snare. Resection and retrieval were                            complete. Verification of patient identification                            for the specimen was done. Estimated blood loss was                            minimal.                           Multiple diverticula were found in the left colon.                           Anal papilla(e) were hypertrophied.                           The colon (entire examined portion) was redundant.                            Advancing the scope required applying abdominal                            pressure.                           The exam was otherwise without abnormality on                             direct and retroflexion views. Complications:            No immediate complications. Estimated Blood Loss:     Estimated blood loss was minimal. 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                           - Five diminutive polyps in the descending colon                            and in the transverse colon, removed with a cold  snare. Resected and retrieved.                           - Diverticulosis in the left colon.                           - Anal papilla(e) were hypertrophied.                           - Redundant colon. Abdominal binder and pressure                            utilized to overcome.                           - The examination was otherwise normal on direct                            and retroflexion views except for some mild                            melanosis in left colon.                           - Personal history of colonic polyps. 2021                            colonoscopy w/ 7 adenomas max 6 mm - prior 2018 3                            diminutive adenomas Recommendation:           - Patient has a contact number available for                            emergencies. The signs and symptoms of potential                            delayed complications were discussed with the                            patient. Return to normal activities tomorrow.                            Written discharge instructions were provided to the                            patient.                           - Resume previous diet.                           - Continue present medications.                           - Repeat colonoscopy is  recommended for                            surveillance. The colonoscopy date will be                            determined after pathology results from today's                            exam become available for review.                            - Would use abdominal binder, schedule 2 slots and                            do extra prep Iva Boop, MD 09/28/2023 12:21:23 PM This report has been signed electronically.

## 2023-09-28 NOTE — Progress Notes (Signed)
 Report given to PACU, vss

## 2023-09-29 ENCOUNTER — Telehealth: Payer: Self-pay | Admitting: *Deleted

## 2023-09-29 NOTE — Telephone Encounter (Signed)
  Follow up Call-     09/28/2023   10:43 AM  Call back number  Post procedure Call Back phone  # [phone]  Permission to leave phone message Yes     Patient questions:  Do you have a fever, pain , or abdominal swelling? No. Pain Score  0 *  Have you tolerated food without any problems? Yes.    Have you been able to return to your normal activities? Yes.    Do you have any questions about your discharge instructions: Diet   No. Medications  No. Follow up visit  No.  Do you have questions or concerns about your Care? No.  Actions: * If pain score is 4 or above: No action needed, pain <4.

## 2023-10-02 DIAGNOSIS — M25512 Pain in left shoulder: Secondary | ICD-10-CM | POA: Diagnosis not present

## 2023-10-02 LAB — SURGICAL PATHOLOGY

## 2023-10-03 ENCOUNTER — Encounter: Payer: Self-pay | Admitting: Internal Medicine

## 2023-10-03 DIAGNOSIS — F605 Obsessive-compulsive personality disorder: Secondary | ICD-10-CM | POA: Diagnosis not present

## 2023-10-03 DIAGNOSIS — F3289 Other specified depressive episodes: Secondary | ICD-10-CM | POA: Diagnosis not present

## 2023-10-03 DIAGNOSIS — Z860101 Personal history of adenomatous and serrated colon polyps: Secondary | ICD-10-CM

## 2023-10-05 ENCOUNTER — Other Ambulatory Visit (HOSPITAL_COMMUNITY): Payer: Self-pay | Admitting: Medical

## 2023-10-05 ENCOUNTER — Other Ambulatory Visit (HOSPITAL_BASED_OUTPATIENT_CLINIC_OR_DEPARTMENT_OTHER): Payer: Self-pay

## 2023-10-05 DIAGNOSIS — M25512 Pain in left shoulder: Secondary | ICD-10-CM | POA: Diagnosis not present

## 2023-10-05 MED ORDER — DICLOFENAC SODIUM 75 MG PO TBEC
75.0000 mg | DELAYED_RELEASE_TABLET | Freq: Two times a day (BID) | ORAL | 0 refills | Status: AC
  Filled 2023-10-05: qty 20, 10d supply, fill #0

## 2023-10-07 ENCOUNTER — Other Ambulatory Visit (HOSPITAL_BASED_OUTPATIENT_CLINIC_OR_DEPARTMENT_OTHER): Payer: Self-pay

## 2023-10-07 ENCOUNTER — Ambulatory Visit (HOSPITAL_COMMUNITY)
Admission: RE | Admit: 2023-10-07 | Discharge: 2023-10-07 | Disposition: A | Discharge status: Home / Self Care | Source: Ambulatory Visit | Attending: Medical | Admitting: Medical

## 2023-10-07 DIAGNOSIS — M25512 Pain in left shoulder: Secondary | ICD-10-CM | POA: Insufficient documentation

## 2023-10-07 DIAGNOSIS — S46212A Strain of muscle, fascia and tendon of other parts of biceps, left arm, initial encounter: Secondary | ICD-10-CM | POA: Diagnosis not present

## 2023-10-07 DIAGNOSIS — M19012 Primary osteoarthritis, left shoulder: Secondary | ICD-10-CM | POA: Diagnosis not present

## 2023-10-07 DIAGNOSIS — M75122 Complete rotator cuff tear or rupture of left shoulder, not specified as traumatic: Secondary | ICD-10-CM | POA: Diagnosis not present

## 2023-10-07 DIAGNOSIS — R609 Edema, unspecified: Secondary | ICD-10-CM | POA: Diagnosis not present

## 2023-10-09 ENCOUNTER — Other Ambulatory Visit (HOSPITAL_BASED_OUTPATIENT_CLINIC_OR_DEPARTMENT_OTHER): Payer: Self-pay

## 2023-10-12 DIAGNOSIS — M25512 Pain in left shoulder: Secondary | ICD-10-CM | POA: Diagnosis not present

## 2023-10-17 DIAGNOSIS — F3289 Other specified depressive episodes: Secondary | ICD-10-CM | POA: Diagnosis not present

## 2023-10-17 DIAGNOSIS — F605 Obsessive-compulsive personality disorder: Secondary | ICD-10-CM | POA: Diagnosis not present

## 2023-10-31 DIAGNOSIS — F3289 Other specified depressive episodes: Secondary | ICD-10-CM | POA: Diagnosis not present

## 2023-10-31 DIAGNOSIS — F605 Obsessive-compulsive personality disorder: Secondary | ICD-10-CM | POA: Diagnosis not present

## 2023-11-02 ENCOUNTER — Other Ambulatory Visit (HOSPITAL_BASED_OUTPATIENT_CLINIC_OR_DEPARTMENT_OTHER): Payer: Self-pay

## 2023-11-02 MED ORDER — SERTRALINE HCL 100 MG PO TABS
ORAL_TABLET | ORAL | 2 refills | Status: AC
Start: 2023-11-02 — End: ?
  Filled 2023-11-02 – 2023-11-14 (×2): qty 60, 60d supply, fill #0

## 2023-11-11 ENCOUNTER — Encounter (HOSPITAL_BASED_OUTPATIENT_CLINIC_OR_DEPARTMENT_OTHER): Payer: Self-pay

## 2023-11-11 ENCOUNTER — Other Ambulatory Visit (HOSPITAL_BASED_OUTPATIENT_CLINIC_OR_DEPARTMENT_OTHER): Payer: Self-pay

## 2023-11-13 ENCOUNTER — Other Ambulatory Visit (HOSPITAL_BASED_OUTPATIENT_CLINIC_OR_DEPARTMENT_OTHER): Payer: Self-pay

## 2023-11-14 ENCOUNTER — Other Ambulatory Visit (HOSPITAL_BASED_OUTPATIENT_CLINIC_OR_DEPARTMENT_OTHER): Payer: Self-pay

## 2023-11-24 DIAGNOSIS — R748 Abnormal levels of other serum enzymes: Secondary | ICD-10-CM | POA: Diagnosis not present

## 2023-11-24 DIAGNOSIS — E1169 Type 2 diabetes mellitus with other specified complication: Secondary | ICD-10-CM | POA: Diagnosis not present

## 2023-11-24 DIAGNOSIS — E7800 Pure hypercholesterolemia, unspecified: Secondary | ICD-10-CM | POA: Diagnosis not present

## 2023-11-24 DIAGNOSIS — F411 Generalized anxiety disorder: Secondary | ICD-10-CM | POA: Diagnosis not present

## 2023-11-24 DIAGNOSIS — I2510 Atherosclerotic heart disease of native coronary artery without angina pectoris: Secondary | ICD-10-CM | POA: Diagnosis not present

## 2023-11-28 DIAGNOSIS — F605 Obsessive-compulsive personality disorder: Secondary | ICD-10-CM | POA: Diagnosis not present

## 2023-11-28 DIAGNOSIS — F3289 Other specified depressive episodes: Secondary | ICD-10-CM | POA: Diagnosis not present

## 2023-11-28 DIAGNOSIS — M25512 Pain in left shoulder: Secondary | ICD-10-CM | POA: Diagnosis not present

## 2023-12-07 ENCOUNTER — Other Ambulatory Visit (HOSPITAL_BASED_OUTPATIENT_CLINIC_OR_DEPARTMENT_OTHER): Payer: Self-pay

## 2023-12-13 ENCOUNTER — Other Ambulatory Visit (HOSPITAL_BASED_OUTPATIENT_CLINIC_OR_DEPARTMENT_OTHER): Payer: Self-pay

## 2023-12-13 DIAGNOSIS — K080 Exfoliation of teeth due to systemic causes: Secondary | ICD-10-CM | POA: Diagnosis not present

## 2023-12-13 DIAGNOSIS — F411 Generalized anxiety disorder: Secondary | ICD-10-CM | POA: Diagnosis not present

## 2023-12-13 DIAGNOSIS — F605 Obsessive-compulsive personality disorder: Secondary | ICD-10-CM | POA: Diagnosis not present

## 2023-12-13 MED ORDER — LAMOTRIGINE 100 MG PO TABS
200.0000 mg | ORAL_TABLET | Freq: Every day | ORAL | 3 refills | Status: AC
  Filled 2023-12-13 – 2024-02-25 (×2): qty 180, 90d supply, fill #0
  Filled 2024-05-27: qty 180, 90d supply, fill #1

## 2023-12-13 MED ORDER — SERTRALINE HCL 100 MG PO TABS
150.0000 mg | ORAL_TABLET | Freq: Every day | ORAL | 1 refills | Status: AC
  Filled 2023-12-13 – 2023-12-23 (×2): qty 135, 90d supply, fill #0
  Filled 2024-03-15: qty 135, 90d supply, fill #1

## 2023-12-23 ENCOUNTER — Other Ambulatory Visit (HOSPITAL_BASED_OUTPATIENT_CLINIC_OR_DEPARTMENT_OTHER): Payer: Self-pay

## 2023-12-26 DIAGNOSIS — F605 Obsessive-compulsive personality disorder: Secondary | ICD-10-CM | POA: Diagnosis not present

## 2023-12-26 DIAGNOSIS — F3289 Other specified depressive episodes: Secondary | ICD-10-CM | POA: Diagnosis not present

## 2024-01-10 DIAGNOSIS — M25512 Pain in left shoulder: Secondary | ICD-10-CM | POA: Diagnosis not present

## 2024-01-30 DIAGNOSIS — F3289 Other specified depressive episodes: Secondary | ICD-10-CM | POA: Diagnosis not present

## 2024-01-30 DIAGNOSIS — F605 Obsessive-compulsive personality disorder: Secondary | ICD-10-CM | POA: Diagnosis not present

## 2024-01-31 NOTE — Telephone Encounter (Signed)
 Orders only

## 2024-02-01 DIAGNOSIS — F411 Generalized anxiety disorder: Secondary | ICD-10-CM | POA: Diagnosis not present

## 2024-02-01 DIAGNOSIS — F605 Obsessive-compulsive personality disorder: Secondary | ICD-10-CM | POA: Diagnosis not present

## 2024-02-01 DIAGNOSIS — F3341 Major depressive disorder, recurrent, in partial remission: Secondary | ICD-10-CM | POA: Diagnosis not present

## 2024-02-12 ENCOUNTER — Other Ambulatory Visit (HOSPITAL_BASED_OUTPATIENT_CLINIC_OR_DEPARTMENT_OTHER): Payer: Self-pay

## 2024-02-12 MED ORDER — METFORMIN HCL 500 MG PO TABS
1000.0000 mg | ORAL_TABLET | Freq: Two times a day (BID) | ORAL | 3 refills | Status: AC
  Filled 2024-02-12: qty 360, 90d supply, fill #0
  Filled 2024-05-14: qty 360, 90d supply, fill #1

## 2024-02-12 MED ORDER — METFORMIN HCL 500 MG PO TABS: 1000.0000 mg | ORAL_TABLET | Freq: Two times a day (BID) | ORAL | 3 refills | Status: AC

## 2024-02-13 DIAGNOSIS — H6122 Impacted cerumen, left ear: Secondary | ICD-10-CM | POA: Diagnosis not present

## 2024-02-25 ENCOUNTER — Other Ambulatory Visit (HOSPITAL_BASED_OUTPATIENT_CLINIC_OR_DEPARTMENT_OTHER): Payer: Self-pay

## 2024-02-26 ENCOUNTER — Other Ambulatory Visit (HOSPITAL_BASED_OUTPATIENT_CLINIC_OR_DEPARTMENT_OTHER): Payer: Self-pay

## 2024-02-26 ENCOUNTER — Other Ambulatory Visit: Payer: Self-pay

## 2024-02-26 ENCOUNTER — Telehealth: Admitting: Physician Assistant

## 2024-02-26 DIAGNOSIS — J019 Acute sinusitis, unspecified: Secondary | ICD-10-CM | POA: Diagnosis not present

## 2024-02-26 MED ORDER — AMOXICILLIN-POT CLAVULANATE 875-125 MG PO TABS: 1.0000 | ORAL_TABLET | Freq: Two times a day (BID) | ORAL | 0 refills | Status: AC

## 2024-02-26 NOTE — Patient Instructions (Signed)
 Timothy [name], thank you for joining Lynden GORMAN Snuffer, PA-C for today's virtual visit.  While this provider is not your primary care provider (PCP), if your PCP is located in our provider database this encounter information will be shared with them immediately following your visit.   A [facility] MyChart account gives you access to today's visit and all your visits, tests, and labs performed at [facility]  click here if you don't have a [facility] MyChart account or go to [email or web address]  Consent: (Patient) Timothy [name] provided verbal consent for this virtual visit at the beginning of the encounter.  Current Medications:  Current Outpatient Medications:    acetaminophen  (TYLENOL ) 500 MG tablet, Take 500 mg by mouth every 6 (six) hours as needed for moderate pain (pain score 4-6)., Disp: , Rfl:    albuterol  (VENTOLIN  HFA) 108 (90 Base) MCG/ACT inhaler, Inhale 2 puffs into the lungs every 6 (six) hours as needed for wheezing or shortness of breath., Disp: 8 g, Rfl: 2   aspirin  EC 81 MG tablet, Take 81 mg by mouth daily. Swallow whole., Disp: , Rfl:    atovaquone -proguanil (MALARONE ) 250-100 MG TABS tablet, Take 1 tablet by mouth daily ,start 1 to 2 days prior to entering a malaria-endemic area, continue throughout the stay and for 7 days after returning, Disp: 18 tablet, Rfl: 0   Azelastine  HCl 0.15 % SOLN, Place 1-2 sprays into the nose 2 (two) times daily as needed., Disp: 30 mL, Rfl: 11   blood glucose meter kit and supplies, Use to check blood sugar 1 to 2 times daily, Disp: 1 each, Rfl: 0   cetirizine (ZYRTEC) 10 MG tablet, Take 10 mg by mouth daily., Disp: , Rfl:    diclofenac  (VOLTAREN ) 75 MG EC tablet, Take 1 tablet (75 mg total) by mouth 2 (two) times daily., Disp: 20 tablet, Rfl: 0   Dulaglutide  (TRULICITY ) 0.75 MG/0.5ML SOAJ, Inject 0.75 mg into the skin once a week., Disp: 6 mL, Rfl: 3   ezetimibe  (ZETIA ) 10 MG tablet, Take 1 tablet (10 mg total) by  mouth daily., Disp: 90 tablet, Rfl: 3   famotidine  (PEPCID ) 10 MG tablet, Take 10 mg by mouth daily as needed for heartburn or indigestion., Disp: , Rfl:    fluticasone  (FLONASE ) 50 MCG/ACT nasal spray, Place 2 sprays into both nostrils daily., Disp: , Rfl:    glucose blood (ONETOUCH VERIO) test strip, Check blood glucose up to 3 times daily, Disp: 300 strip, Rfl: 3   lamoTRIgine  (LAMICTAL ) 100 MG tablet, Take 2 tablets (200 mg total) by mouth daily., Disp: 180 tablet, Rfl: 3   Lancets (ONETOUCH DELICA PLUS LANCET33G) MISC, Use to test blood sugar 1 to 2 times per day, Disp: 200 each, Rfl: 3   metFORMIN  (GLUCOPHAGE ) 500 MG tablet, Take 2 tablets (1,000 mg total) by mouth 2 (two) times daily., Disp: 360 tablet, Rfl: 3   metFORMIN  (GLUCOPHAGE ) 500 MG tablet, Take 2 tablets (1,000 mg total) by mouth 2 (two) times daily as directed., Disp: 360 tablet, Rfl: 3   Multiple Vitamins-Minerals (CENTRUM SILVER 50+MEN) TABS, Take 1 tablet by mouth daily., Disp: , Rfl:    rosuvastatin  (CRESTOR ) 20 MG tablet, Take 1 tablet (20 mg total) by mouth daily., Disp: 90 tablet, Rfl: 3   sertraline  (ZOLOFT ) 100 MG tablet, Take 1/2 tablet for 1-2 weeks daily, then increase to 1 tablet daily as tolerable., Disp: 60 tablet, Rfl: 2   sertraline  (ZOLOFT ) 100 MG tablet, Take 1.5 tablets (  150 mg total) by mouth daily., Disp: 135 tablet, Rfl: 1  Current Facility-Administered Medications:    0.9 %  sodium chloride  infusion, 500 mL, Intravenous, Once, Aneita Gwendlyn DASEN, MD   diazepam  (VALIUM ) tablet 10 mg, 10 mg, Oral, Once, Jerilynn Lamarr HERO, NP   Medications ordered in this encounter:  No orders of the defined types were placed in this encounter.    *If you need refills on other medications prior to your next appointment, please contact your pharmacy*  Follow-Up: Call back or seek an in-person evaluation if the symptoms worsen or if the condition fails to improve as anticipated.  [facility] [phone]  Other Instructions Please take the antibiotics as directed  Regarding antihistamine/intranasal steroid use use: uptodate indicates that continuation of antihistamines is sometimes indicated with a history of allergies so this would likely be beneficial to continue in your case. Regarding the intranasal steroids, it does appear there would be some benefit to continuing the meds when used after saline nasal spray and there would be little risk of harm.   Follow up with your regular doctor in 1 week for reassessment and seek care sooner if your symptoms worsen or fail to improve.  If you have been instructed to have an in-person evaluation today at a local Urgent Care facility, please use the link below. It will take you to a list of all of our available [facility], including address, phone number and hours of operation. Please do not delay care.  [facility]  If you or a family member do not have a primary care provider, use the link below to schedule a visit and establish care. When you choose a [facility] primary care physician or advanced practice provider, you gain a long-term partner in health. Find a Primary Care Provider  Learn more about [facility]'s in-office and virtual care options:  - Get Care Now

## 2024-02-26 NOTE — Progress Notes (Signed)
 [name], [name] are scheduled for a virtual visit with your provider today.    Just as we do with appointments in the office, we must obtain your consent to participate.  Your consent will be active for this visit and any virtual visit you may have with one of our providers in the next 365 days.    If you have a MyChart account, I can also send a copy of this consent to you electronically.  All virtual visits are billed to your insurance company just like a traditional visit in the office.  As this is a virtual visit, video technology does not allow for your provider to perform a traditional examination.  This may limit your provider's ability to fully assess your condition.  If your provider identifies any concerns that need to be evaluated in person or the need to arrange testing such as labs, EKG, etc, we will make arrangements to do so.    Although advances in technology are sophisticated, we cannot ensure that it will always work on either your end or our end.  If the connection with a video visit is poor, we may have to switch to a telephone visit.  With either a video or telephone visit, we are not always able to ensure that we have a secure connection.   I need to obtain your verbal consent now.   Are you willing to proceed with your visit today?   Timothy [name] has provided verbal consent on 02/26/2024 for a virtual visit (video or telephone).   Timothy GORMAN Snuffer, PA-C 02/26/2024  11:24 AM   Date:  02/26/2024   ID:  Timothy [name], DOB 11-Aug-1955, MRN [id number]  Patient Location: Home Provider Location: Home Office   Participants: Patient and Provider for Visit and Wrap up  Method of visit: Video  Location of Patient: Home Location of Provider: Home Office Consent was obtain for visit over the video. Services rendered by provider: Visit was performed via video  A video enabled telemedicine application was used and I verified that I am speaking with the correct person using two  identifiers.  PCP:  Timothy [name] LABOR, MD   Chief Complaint:  Timothy [name]  History of Present Illness:    Timothy [name] is a 68 y.o. male with history as stated below. Presents video telehealth for an acute care visit  Pt reports that he has a h/o allergies and about a week ago was having rhinorrhea and congestion. Over the last 2-3 days his symptoms have been worse and he started to have a sore throat, change in the color of his sinus drainage, post nasal drip, mild wheezing, sinus discomfort and increased fatigue. No reports fevers, chest pain or shortness of breath.   Does report sick contacts.  Past Medical, Surgical, Social History, Allergies, and Medications have been Reviewed.  Past Medical History:  Diagnosis Date   Acute meniscal tear of knee    Anxiety    Asthma    CAD (coronary artery disease)    Cancer (HCC)    Cataract    Environmental allergies    GERD (gastroesophageal reflux disease)    History of basal cell carcinoma (BCC) excision    History of detached retina repair    left s/p laser 2019;  right s/p surgical repair 02/ 2015   History of malignant melanoma of skin    04-26-2019 per pt fall 2019 excision from back, localized, and no recurrence   History of prostate cancer urologist ---  dr renda   dx 2016-- Stage T1c, Gleason 3+3;  05-11-2015 s/p  radical prostatectomy  (04-26-2019 per pt psa undetectable)   Hx of adenomatous colonic polyps 01/18/2017   Hyperlipidemia    Low platelet count (HCC)    bruising   Mild obstructive sleep apnea    no longer has to use CPAP wt. loss (pt retested 10-30-2014 epic)   OA (osteoarthritis)    Sleep apnea    oral appliance    Thrombocytopenia, unspecified (HCC)    04-26-2019  per pt baseline plt count 150   Type 2 diabetes mellitus (HCC)    followed by pcp  (04-26-2019 check's cbg daily,  fasting cbg-- 110-120;  per pt last A1c 5.8)   Wears glasses     No outpatient medications have been marked as taking for  the 02/26/24 encounter (Video Visit) with [facility] PROVIDER.   Current Facility-Administered Medications for the 02/26/24 encounter (Video Visit) with [facility] PROVIDER  Medication   0.9 %  sodium chloride  infusion   diazepam  (VALIUM ) tablet 10 mg     Allergies:   Ace inhibitors and Sporanox [itraconazole]   ROS See HPI for history of present illness.  Physical Exam Constitutional:      Appearance: Normal appearance. He is not ill-appearing.  Neurological:     Mental Status: He is alert.               MDM: Pt with sinus [name] ongoing for 1 week with acute worsening of [name] suggestive of bacterial sinusitis. Will tx with augmentin . Discussed continuation of steroid nasal spray and antihistamines.    Tests Ordered: No orders of the defined types were placed in this encounter.   Medication Changes: No orders of the defined types were placed in this encounter.    Disposition:  Follow up  Signed, Timothy GORMAN Snuffer, PA-C  02/26/2024 11:24 AM

## 2024-02-28 ENCOUNTER — Other Ambulatory Visit: Payer: Self-pay | Admitting: Internal Medicine

## 2024-02-28 DIAGNOSIS — Z0000 Encounter for general adult medical examination without abnormal findings: Secondary | ICD-10-CM

## 2024-02-29 ENCOUNTER — Other Ambulatory Visit: Payer: Self-pay | Admitting: Internal Medicine

## 2024-03-15 ENCOUNTER — Other Ambulatory Visit: Payer: Self-pay

## 2024-03-19 DIAGNOSIS — F605 Obsessive-compulsive personality disorder: Secondary | ICD-10-CM | POA: Diagnosis not present

## 2024-03-19 DIAGNOSIS — F3289 Other specified depressive episodes: Secondary | ICD-10-CM | POA: Diagnosis not present

## 2024-04-02 DIAGNOSIS — F3289 Other specified depressive episodes: Secondary | ICD-10-CM | POA: Diagnosis not present

## 2024-04-02 DIAGNOSIS — F605 Obsessive-compulsive personality disorder: Secondary | ICD-10-CM | POA: Diagnosis not present

## 2024-04-11 DIAGNOSIS — F909 Attention-deficit hyperactivity disorder, unspecified type: Secondary | ICD-10-CM | POA: Diagnosis not present

## 2024-04-15 ENCOUNTER — Other Ambulatory Visit (HOSPITAL_BASED_OUTPATIENT_CLINIC_OR_DEPARTMENT_OTHER): Payer: Self-pay

## 2024-04-15 MED ORDER — ATOMOXETINE HCL 40 MG PO CAPS
ORAL_CAPSULE | ORAL | 0 refills | Status: DC
  Filled 2024-04-15: qty 60, 34d supply, fill #0

## 2024-04-18 ENCOUNTER — Telehealth: Payer: Self-pay | Admitting: Pulmonary Disease

## 2024-04-18 DIAGNOSIS — G4752 REM sleep behavior disorder: Secondary | ICD-10-CM

## 2024-04-18 NOTE — Telephone Encounter (Signed)
 Discussed symptoms with dr Brien. He is snoring more, still acting out some dreams per his wife. 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 Will order

## 2024-04-20 ENCOUNTER — Other Ambulatory Visit (HOSPITAL_BASED_OUTPATIENT_CLINIC_OR_DEPARTMENT_OTHER): Payer: Self-pay

## 2024-04-25 DIAGNOSIS — F3289 Other specified depressive episodes: Secondary | ICD-10-CM | POA: Diagnosis not present

## 2024-04-25 DIAGNOSIS — F605 Obsessive-compulsive personality disorder: Secondary | ICD-10-CM | POA: Diagnosis not present

## 2024-04-26 DIAGNOSIS — S8992XA Unspecified injury of left lower leg, initial encounter: Secondary | ICD-10-CM | POA: Diagnosis not present

## 2024-04-26 DIAGNOSIS — Z96652 Presence of left artificial knee joint: Secondary | ICD-10-CM | POA: Diagnosis not present

## 2024-05-02 ENCOUNTER — Other Ambulatory Visit (HOSPITAL_BASED_OUTPATIENT_CLINIC_OR_DEPARTMENT_OTHER): Payer: Self-pay

## 2024-05-02 DIAGNOSIS — F605 Obsessive-compulsive personality disorder: Secondary | ICD-10-CM | POA: Diagnosis not present

## 2024-05-02 DIAGNOSIS — F411 Generalized anxiety disorder: Secondary | ICD-10-CM | POA: Diagnosis not present

## 2024-05-02 MED ORDER — ATOMOXETINE HCL 40 MG PO CAPS
40.0000 mg | ORAL_CAPSULE | Freq: Two times a day (BID) | ORAL | 0 refills | Status: AC
Start: 2024-05-02 — End: ?
  Filled 2024-05-02 – 2024-05-14 (×2): qty 180, 90d supply, fill #0

## 2024-05-05 ENCOUNTER — Telehealth: Admitting: Physician Assistant

## 2024-05-05 DIAGNOSIS — J40 Bronchitis, not specified as acute or chronic: Secondary | ICD-10-CM | POA: Diagnosis not present

## 2024-05-05 MED ORDER — AZITHROMYCIN 250 MG PO TABS: ORAL_TABLET | ORAL | 0 refills | Status: AC

## 2024-05-05 NOTE — Progress Notes (Signed)
 Virtual Visit Consent   Timothy [name], you are scheduled for a virtual visit with a [facility] provider today. Just as with appointments in the office, your consent must be obtained to participate. Your consent will be active for this visit and any virtual visit you may have with one of our providers in the next 365 days. If you have a MyChart account, a copy of this consent can be sent to you electronically.  As this is a virtual visit, video technology does not allow for your provider to perform a traditional examination. This may limit your provider's ability to fully assess your condition. If your provider identifies any concerns that need to be evaluated in person or the need to arrange testing (such as labs, EKG, etc.), we will make arrangements to do so. Although advances in technology are sophisticated, we cannot ensure that it will always work on either your end or our end. If the connection with a video visit is poor, the visit may have to be switched to a telephone visit. With either a video or telephone visit, we are not always able to ensure that we have a secure connection.  By engaging in this virtual visit, you consent to the provision of healthcare and authorize for your insurance to be billed (if applicable) for the services provided during this visit. Depending on your insurance coverage, you may receive a charge related to this service.  I need to obtain your verbal consent now. Are you willing to proceed with your visit today? [name] has provided verbal consent on 05/05/2024 for a virtual visit (video or telephone). Harlene PEDLAR Ward, PA-C  Date: 05/05/2024 10:49 AM   Virtual Visit via Video Note   I, Harlene PEDLAR Ward, connected with  Timothy [name]  ([id number], 11-Aug-1955) on 05/05/24 at 10:45 AM EST by a video-enabled telemedicine application and verified that I am speaking with the correct person using two identifiers.  Location: Patient: Virtual Visit Location  Patient: Home Provider: Virtual Visit Location Provider: Home Office   I discussed the limitations of evaluation and management by telemedicine and the availability of in person appointments. The patient expressed understanding and agreed to proceed.    History of Present Illness: Timothy [name] is a 68 y.o. who identifies as a male who was assigned male at birth, and is being seen today for body aches, cough, coughing up green mucus plugs.  Typically experiencing similar sx during change of seasons.  He has h/o asthma, uses rescue inhaler as needed.  Denies wheezing or shortness of breath at this time. He has tessalon .  No sinus pressure or facial pain.  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   Basal cell carcinoma of neck 04/24/2014   Haglund's deformity of right heel 01/16/2014   FOOT PAIN, BILATERAL 04/12/2010   NEOPLASM OF UNCERTAIN BEHAVIOR OF SKIN 10/28/2009   HYPOGONADISM 10/28/2009   Infective otitis externa 10/28/2009  CERUMEN IMPACTION 10/28/2009   ACHILLES TENDINITIS 02/11/2009   PLANTAR FASCIITIS, RIGHT 02/11/2009   Other specified abnormal findings of blood chemistry 08/15/2008   ABNORMAL LIVER FUNCTION TESTS 08/15/2008   OBSTRUCTIVE SLEEP APNEA 10/30/2007   DIABETES MELLITUS, TYPE II 02/03/2007   DYSLIPIDEMIA 02/03/2007   DISORDERS, 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   Asthma 02/03/2007   MICROALBUMINURIA 02/03/2007    Allergies:  Allergies   Allergen Reactions   Ace Inhibitors Cough   Sporanox [Itraconazole] Rash   Medications:  Current Outpatient Medications:    azithromycin  (ZITHROMAX ) 250 MG tablet, Take 2 tablets on day 1, then 1 tablet daily on days 2 through 5, Disp: 6 tablet, Rfl: 0   acetaminophen  (TYLENOL ) 500 MG tablet, Take 500 mg by mouth every 6 (six) hours as needed for moderate pain (pain score 4-6)., Disp: , Rfl:    albuterol  (VENTOLIN  HFA) 108 (90 Base) MCG/ACT inhaler, Inhale 2 puffs into the lungs every 6 (six) hours as needed for wheezing or shortness of breath., Disp: 8 g, Rfl: 2   aspirin  EC 81 MG tablet, Take 81 mg by mouth daily. Swallow whole., Disp: , Rfl:    atomoxetine (STRATTERA) 40 MG capsule, Take 1 capsule (40 mg total) by mouth 2 (two) times daily. Swallow whole. Do not open., Disp: 180 capsule, Rfl: 0   atovaquone -proguanil (MALARONE ) 250-100 MG TABS tablet, Take 1 tablet by mouth daily ,start 1 to 2 days prior to entering a malaria-endemic area, continue throughout the stay and for 7 days after returning, Disp: 18 tablet, Rfl: 0   Azelastine  HCl 0.15 % SOLN, Place 1-2 sprays into the nose 2 (two) times daily as needed., Disp: 30 mL, Rfl: 11   blood glucose meter kit and supplies, Use to check blood sugar 1 to 2 times daily, Disp: 1 each, Rfl: 0   cetirizine (ZYRTEC) 10 MG tablet, Take 10 mg by mouth daily., Disp: , Rfl:    diclofenac  (VOLTAREN ) 75 MG EC tablet, Take 1 tablet (75 mg total) by mouth 2 (two) times daily., Disp: 20 tablet, Rfl: 0   Dulaglutide  (TRULICITY ) 0.75 MG/0.5ML SOAJ, Inject 0.75 mg into the skin once a week., Disp: 6 mL, Rfl: 3   ezetimibe  (ZETIA ) 10 MG tablet, Take 1 tablet (10 mg total) by mouth daily., Disp: 90 tablet, Rfl: 3   famotidine  (PEPCID ) 10 MG tablet, Take 10 mg by mouth daily as needed for heartburn or indigestion., Disp: , Rfl:    fluticasone  (FLONASE ) 50 MCG/ACT nasal spray, Place 2 sprays into both nostrils daily., Disp: , Rfl:    glucose blood (ONETOUCH  VERIO) test strip, Check blood glucose up to 3 times daily, Disp: 300 strip, Rfl: 3   lamoTRIgine  (LAMICTAL ) 100 MG tablet, Take 2 tablets (200 mg total) by mouth daily., Disp: 180 tablet, Rfl: 3   Lancets (ONETOUCH DELICA PLUS LANCET33G) MISC, Use to test blood sugar 1 to 2 times per day, Disp: 200 each, Rfl: 3   metFORMIN  (GLUCOPHAGE ) 500 MG tablet, Take 2 tablets (1,000 mg total) by mouth 2 (two) times daily., Disp: 360 tablet, Rfl: 3   metFORMIN  (GLUCOPHAGE ) 500 MG tablet, Take 2 tablets (1,000 mg total) by mouth 2 (two) times daily as directed., Disp: 360 tablet, Rfl: 3   Multiple Vitamins-Minerals (CENTRUM SILVER 50+MEN) TABS, Take 1 tablet by mouth daily., Disp: , Rfl:    rosuvastatin  (CRESTOR ) 20 MG tablet, Take 1 tablet (20 mg total) by mouth daily., Disp: 90 tablet, Rfl: 3   sertraline  (  ZOLOFT ) 100 MG tablet, Take 1/2 tablet for 1-2 weeks daily, then increase to 1 tablet daily as tolerable., Disp: 60 tablet, Rfl: 2   sertraline  (ZOLOFT ) 100 MG tablet, Take 1.5 tablets (150 mg total) by mouth daily., Disp: 135 tablet, Rfl: 1  Current Facility-Administered Medications:    0.9 %  sodium chloride  infusion, 500 mL, Intravenous, Once, Aneita Gwendlyn DASEN, MD   diazepam  (VALIUM ) tablet 10 mg, 10 mg, Oral, Once, Jerilynn Lamarr HERO, NP  Observations/Objective: Patient is well-developed, well-nourished in no acute distress.  Resting comfortably at home.  Head is normocephalic, atraumatic.  No labored breathing.  Speech is clear and coherent with logical content.  Patient is alert and oriented at baseline.    Assessment and Plan: 1. Bronchitis (Primary)  Azithromycin  prescribed, this has been helpful with previous bronchitis.  Supportive care discussed.   Follow Up Instructions: I discussed the assessment and treatment plan with the patient. The patient was provided an opportunity to ask questions and all were answered. The patient agreed with the plan and demonstrated an understanding of  the instructions.  A copy of instructions were sent to the patient via MyChart unless otherwise noted below.     The patient was advised to call back or seek an in-person evaluation if the symptoms worsen or if the condition fails to improve as anticipated.    Harlene PEDLAR Ward, PA-C

## 2024-05-05 NOTE — Patient Instructions (Signed)
 Timothy [name], thank you for joining Harlene PEDLAR Ward, PA-C for today's virtual visit.  While this provider is not your primary care provider (PCP), if your PCP is located in our provider database this encounter information will be shared with them immediately following your visit.   A [facility] MyChart account gives you access to today's visit and all your visits, tests, and labs performed at [facility]  click here if you don't have a [facility] MyChart account or go to [email or web address]  Consent: (Patient) Timothy [name] provided verbal consent for this virtual visit at the beginning of the encounter.  Current Medications:  Current Outpatient Medications:    azithromycin  (ZITHROMAX ) 250 MG tablet, Take 2 tablets on day 1, then 1 tablet daily on days 2 through 5, Disp: 6 tablet, Rfl: 0   acetaminophen  (TYLENOL ) 500 MG tablet, Take 500 mg by mouth every 6 (six) hours as needed for moderate pain (pain score 4-6)., Disp: , Rfl:    albuterol  (VENTOLIN  HFA) 108 (90 Base) MCG/ACT inhaler, Inhale 2 puffs into the lungs every 6 (six) hours as needed for wheezing or shortness of breath., Disp: 8 g, Rfl: 2   aspirin  EC 81 MG tablet, Take 81 mg by mouth daily. Swallow whole., Disp: , Rfl:    atomoxetine (STRATTERA) 40 MG capsule, Take 1 capsule (40 mg total) by mouth 2 (two) times daily. Swallow whole. Do not open., Disp: 180 capsule, Rfl: 0   atovaquone -proguanil (MALARONE ) 250-100 MG TABS tablet, Take 1 tablet by mouth daily ,start 1 to 2 days prior to entering a malaria-endemic area, continue throughout the stay and for 7 days after returning, Disp: 18 tablet, Rfl: 0   Azelastine  HCl 0.15 % SOLN, Place 1-2 sprays into the nose 2 (two) times daily as needed., Disp: 30 mL, Rfl: 11   blood glucose meter kit and supplies, Use to check blood sugar 1 to 2 times daily, Disp: 1 each, Rfl: 0   cetirizine (ZYRTEC) 10 MG tablet, Take 10 mg by mouth daily., Disp: , Rfl:    diclofenac   (VOLTAREN ) 75 MG EC tablet, Take 1 tablet (75 mg total) by mouth 2 (two) times daily., Disp: 20 tablet, Rfl: 0   Dulaglutide  (TRULICITY ) 0.75 MG/0.5ML SOAJ, Inject 0.75 mg into the skin once a week., Disp: 6 mL, Rfl: 3   ezetimibe  (ZETIA ) 10 MG tablet, Take 1 tablet (10 mg total) by mouth daily., Disp: 90 tablet, Rfl: 3   famotidine  (PEPCID ) 10 MG tablet, Take 10 mg by mouth daily as needed for heartburn or indigestion., Disp: , Rfl:    fluticasone  (FLONASE ) 50 MCG/ACT nasal spray, Place 2 sprays into both nostrils daily., Disp: , Rfl:    glucose blood (ONETOUCH VERIO) test strip, Check blood glucose up to 3 times daily, Disp: 300 strip, Rfl: 3   lamoTRIgine  (LAMICTAL ) 100 MG tablet, Take 2 tablets (200 mg total) by mouth daily., Disp: 180 tablet, Rfl: 3   Lancets (ONETOUCH DELICA PLUS LANCET33G) MISC, Use to test blood sugar 1 to 2 times per day, Disp: 200 each, Rfl: 3   metFORMIN  (GLUCOPHAGE ) 500 MG tablet, Take 2 tablets (1,000 mg total) by mouth 2 (two) times daily., Disp: 360 tablet, Rfl: 3   metFORMIN  (GLUCOPHAGE ) 500 MG tablet, Take 2 tablets (1,000 mg total) by mouth 2 (two) times daily as directed., Disp: 360 tablet, Rfl: 3   Multiple Vitamins-Minerals (CENTRUM SILVER 50+MEN) TABS, Take 1 tablet by mouth daily., Disp: , Rfl:  rosuvastatin  (CRESTOR ) 20 MG tablet, Take 1 tablet (20 mg total) by mouth daily., Disp: 90 tablet, Rfl: 3   sertraline  (ZOLOFT ) 100 MG tablet, Take 1/2 tablet for 1-2 weeks daily, then increase to 1 tablet daily as tolerable., Disp: 60 tablet, Rfl: 2   sertraline  (ZOLOFT ) 100 MG tablet, Take 1.5 tablets (150 mg total) by mouth daily., Disp: 135 tablet, Rfl: 1  Current Facility-Administered Medications:    0.9 %  sodium chloride  infusion, 500 mL, Intravenous, Once, Aneita Gwendlyn DASEN, MD   diazepam  (VALIUM ) tablet 10 mg, 10 mg, Oral, Once, Jerilynn Lamarr HERO, NP   Medications ordered in this encounter:  Meds ordered this encounter  Medications   azithromycin   (ZITHROMAX ) 250 MG tablet    Sig: Take 2 tablets on day 1, then 1 tablet daily on days 2 through 5    Dispense:  6 tablet    Refill:  0    Supervising Provider:   LAMPTEY, PHILIP O [phone]     *If you need refills on other medications prior to your next appointment, please contact your pharmacy*  Follow-Up: Call back or seek an in-person evaluation if the symptoms worsen or if the condition fails to improve as anticipated.  [facility] [phone]  Other Instructions Take azithromycin  as prescribed.  Can use albuterol  inhaler and tessalon  as needed.  If no improvement or symptoms become worse please reach back out to us  or be seen for in person evaluation.    If you have been instructed to have an in-person evaluation today at a local Urgent Care facility, please use the link below. It will take you to a list of all of our available [facility], including address, phone number and hours of operation. Please do not delay care.  [facility]  If you or a family member do not have a primary care provider, use the link below to schedule a visit and establish care. When you choose a [facility] primary care physician or advanced practice provider, you gain a long-term partner in health. Find a Primary Care Provider  Learn more about [facility]'s in-office and virtual care options:  - Get Care Now

## 2024-05-07 ENCOUNTER — Other Ambulatory Visit (HOSPITAL_COMMUNITY): Payer: Self-pay

## 2024-05-07 DIAGNOSIS — F909 Attention-deficit hyperactivity disorder, unspecified type: Secondary | ICD-10-CM

## 2024-05-10 DIAGNOSIS — R413 Other amnesia: Secondary | ICD-10-CM | POA: Diagnosis not present

## 2024-05-10 DIAGNOSIS — F909 Attention-deficit hyperactivity disorder, unspecified type: Secondary | ICD-10-CM | POA: Diagnosis not present

## 2024-05-13 ENCOUNTER — Ambulatory Visit (HOSPITAL_COMMUNITY)
Admission: RE | Admit: 2024-05-13 | Discharge: 2024-05-13 | Disposition: A | Discharge status: Home / Self Care | Source: Ambulatory Visit

## 2024-05-13 DIAGNOSIS — F988 Other specified behavioral and emotional disorders with onset usually occurring in childhood and adolescence: Secondary | ICD-10-CM | POA: Diagnosis not present

## 2024-05-13 DIAGNOSIS — F909 Attention-deficit hyperactivity disorder, unspecified type: Secondary | ICD-10-CM | POA: Insufficient documentation

## 2024-05-14 ENCOUNTER — Ambulatory Visit: Attending: Internal Medicine | Admitting: Physical Therapy

## 2024-05-14 ENCOUNTER — Other Ambulatory Visit: Payer: Self-pay

## 2024-05-14 ENCOUNTER — Other Ambulatory Visit (HOSPITAL_BASED_OUTPATIENT_CLINIC_OR_DEPARTMENT_OTHER): Payer: Self-pay

## 2024-05-14 DIAGNOSIS — R413 Other amnesia: Secondary | ICD-10-CM | POA: Diagnosis not present

## 2024-05-14 DIAGNOSIS — E559 Vitamin D deficiency, unspecified: Secondary | ICD-10-CM | POA: Diagnosis not present

## 2024-05-14 DIAGNOSIS — E1169 Type 2 diabetes mellitus with other specified complication: Secondary | ICD-10-CM | POA: Diagnosis not present

## 2024-05-14 DIAGNOSIS — Z5181 Encounter for therapeutic drug level monitoring: Secondary | ICD-10-CM | POA: Diagnosis not present

## 2024-05-14 DIAGNOSIS — E7800 Pure hypercholesterolemia, unspecified: Secondary | ICD-10-CM | POA: Diagnosis not present

## 2024-05-14 DIAGNOSIS — R2689 Other abnormalities of gait and mobility: Secondary | ICD-10-CM | POA: Diagnosis not present

## 2024-05-14 DIAGNOSIS — R2681 Unsteadiness on feet: Secondary | ICD-10-CM | POA: Diagnosis not present

## 2024-05-14 DIAGNOSIS — Z8546 Personal history of malignant neoplasm of prostate: Secondary | ICD-10-CM | POA: Diagnosis not present

## 2024-05-14 DIAGNOSIS — I2510 Atherosclerotic heart disease of native coronary artery without angina pectoris: Secondary | ICD-10-CM | POA: Diagnosis not present

## 2024-05-14 DIAGNOSIS — M6281 Muscle weakness (generalized): Secondary | ICD-10-CM | POA: Insufficient documentation

## 2024-05-14 DIAGNOSIS — Z9181 History of falling: Secondary | ICD-10-CM | POA: Insufficient documentation

## 2024-05-14 DIAGNOSIS — Z1331 Encounter for screening for depression: Secondary | ICD-10-CM | POA: Diagnosis not present

## 2024-05-14 DIAGNOSIS — Z0000 Encounter for general adult medical examination without abnormal findings: Secondary | ICD-10-CM | POA: Diagnosis not present

## 2024-05-14 MED ORDER — TRULICITY 0.75 MG/0.5ML ~~LOC~~ SOAJ
0.7500 mg | SUBCUTANEOUS | 3 refills | Status: AC
  Filled 2024-05-14: qty 6, 84d supply, fill #0

## 2024-05-14 MED ORDER — SERTRALINE HCL 100 MG PO TABS
150.0000 mg | ORAL_TABLET | Freq: Every day | ORAL | 3 refills | Status: AC
  Filled 2024-06-21: qty 135, 90d supply, fill #0

## 2024-05-14 MED ORDER — ROSUVASTATIN CALCIUM 20 MG PO TABS
20.0000 mg | ORAL_TABLET | Freq: Every day | ORAL | 3 refills | Status: AC
  Filled 2024-05-14: qty 90, 90d supply, fill #0

## 2024-05-14 MED ORDER — EZETIMIBE 10 MG PO TABS
10.0000 mg | ORAL_TABLET | Freq: Every day | ORAL | 3 refills | Status: AC
  Filled 2024-05-14 (×2): qty 90, 90d supply, fill #0

## 2024-05-14 NOTE — Therapy (Signed)
 OUTPATIENT PHYSICAL THERAPY NEURO EVALUATION   Patient Name: Timothy [name] MRN: [id number] DOB:March 12, 1956, 68 y.o., male Today's Date: 05/15/2024   PCP: Charlott Dorn LABOR, MD REFERRING PROVIDER: Charlott Dorn LABOR, MD  END OF SESSION:  PT End of Session - 05/14/24 1330     Visit Number 1    Number of Visits 13    Date for Recertification  07/23/24   Due to delay in scheduling   Authorization Type BCBS Medicare    PT Start Time 1313    PT Stop Time 1400    PT Time Calculation (min) 47 min    Activity Tolerance Patient tolerated treatment well    Behavior During Therapy [facility] for tasks assessed/performed          Past Medical History:  Diagnosis Date   Acute meniscal tear of knee    Anxiety    Asthma    CAD (coronary artery disease)    Cancer (HCC)    Cataract    Environmental allergies    GERD (gastroesophageal reflux disease)    History of basal cell carcinoma (BCC) excision    History of detached retina repair    left s/p laser 2019;  right s/p surgical repair 02/ 2015   History of malignant melanoma of skin    04-26-2019 per pt fall 2019 excision from back, localized, and no recurrence   History of prostate cancer urologist --- dr renda   dx 2016-- Stage T1c, Gleason 3+3;  05-11-2015 s/p  radical prostatectomy  (04-26-2019 per pt psa undetectable)   Hx of adenomatous colonic polyps 01/18/2017   Hyperlipidemia    Low platelet count    bruising   Mild obstructive sleep apnea    no longer has to use CPAP wt. loss (pt retested 10-30-2014 epic)   OA (osteoarthritis)    Sleep apnea    oral appliance    Thrombocytopenia, unspecified    04-26-2019  per pt baseline plt count 150   Type 2 diabetes mellitus (HCC)    followed by pcp  (04-26-2019 check's cbg daily,  fasting cbg-- 110-120;  per pt last A1c 5.8)   Wears glasses    Past Surgical History:  Procedure Laterality Date   CATARACT EXTRACTION W/ INTRAOCULAR LENS  IMPLANT, BILATERAL  01/2018    COLONOSCOPY  last one 01-12-2017   GAS INSERTION Right 08/19/2013   Procedure: INSERTION OF GAS;  Surgeon: Arley LABOR Ruder, MD;  Location: [facility] OR;  Service: Ophthalmology;  Laterality: Right;  SF6   KNEE ARTHROSCOPY Left 05/02/2019   Procedure: ARTHROSCOPY KNEE evaluation under anesthesia debridement partial medial meniscectomy chondroplasty;  Surgeon: Gerome Charleston, MD;  Location: [facility];  Service: Orthopedics;  Laterality: Left;  Anesthesia - Femoral nerve block/knee block/IV sedation   LEFT HEART CATH AND CORONARY ANGIOGRAPHY N/A 08/15/2023   Procedure: LEFT HEART CATH AND CORONARY ANGIOGRAPHY;  Surgeon: [name], MD;  Location: [facility] INVASIVE CV LAB;  Service: Cardiovascular;  Laterality: N/A;   MASS EXCISION Left 07/08/2014   Procedure: EXCISION OF KERATOACANTHOMA LEFT LOWER LEG, AND EXCISION OF BASIL CELL CARCINOMA FROM NECK;  Surgeon: [name], MD;  Location: [facility];  Service: General;  Laterality: Left;  left lower leg and posterior neck   NASAL SEPTOPLASTY W/ TURBINOPLASTY  12-13-2001  dr byers @[facility]    PROSTATE BIOPSY  11/11/2014   REPLACEMENT TOTAL KNEE     ROBOT ASSISTED LAPAROSCOPIC RADICAL PROSTATECTOMY N/A 05/11/2015   Procedure: ROBOTIC ASSISTED LAPAROSCOPIC RADICAL PROSTATECTOMY LEVEL  1;  Surgeon: [name], MD;  Location: [facility] ORS;  Service: Urology;  Laterality: N/A;   SCLERAL BUCKLE WITH CRYO Right 08/19/2013   Procedure: SCLERAL BUCKLE WITH CRYOPEXY;  Surgeon: [name], MD;  Location: [facility] OR;  Service: Ophthalmology;  Laterality: Right;   TONSILLECTOMY AND ADENOIDECTOMY  chiuld   Patient Active Problem List   Diagnosis Date Noted   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   Dry mouth 11/26/2019   Nasal valve collapse 11/26/2019   Sensorineural hearing loss (SNHL) of left ear 11/26/2019   Acquired trigger finger of right middle finger 05/27/2019   Pain in finger of right hand 05/27/2019   Pain in left knee 04/08/2019   Hx of  adenomatous colonic polyps 01/18/2017   Prostate cancer (HCC) 05/11/2015   Malignant neoplasm of prostate (HCC) 12/18/2014   Cough 09/10/2014   Keratoacanthoma of lower leg, 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   Basal cell carcinoma of neck 04/24/2014   Haglund's deformity of right heel 01/16/2014   FOOT PAIN, BILATERAL 04/12/2010   NEOPLASM OF UNCERTAIN BEHAVIOR OF SKIN 10/28/2009   HYPOGONADISM 10/28/2009   Infective otitis externa 10/28/2009   CERUMEN IMPACTION 10/28/2009   ACHILLES TENDINITIS 02/11/2009   PLANTAR FASCIITIS, RIGHT 02/11/2009   Other specified abnormal findings of blood chemistry 08/15/2008   ABNORMAL LIVER FUNCTION TESTS 08/15/2008   OBSTRUCTIVE SLEEP APNEA 10/30/2007   DIABETES MELLITUS, TYPE II 02/03/2007   DYSLIPIDEMIA 02/03/2007   DISORDERS, 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   Asthma 02/03/2007   MICROALBUMINURIA 02/03/2007    ONSET DATE: 05/06/2024 (referral)   REFERRING DIAG:  R26.89 (ICD-10-CM) - Other abnormalities of gait and mobility    THERAPY DIAG:  Unsteadiness on feet  Other abnormalities of gait and mobility  Muscle weakness (generalized)  History of falling  Rationale for Evaluation and Treatment: Rehabilitation  SUBJECTIVE:                                                                                                                                                                                             SUBJECTIVE STATEMENT: Pt presents alone, not using AD. Pt reports his issues started in 2020 when he started a community clinic and then COVID hit. Was diagnosed w/GAD at that time and was under a lot of stress. Underwent cognitive testing for memory deficits in 2021 by Dr. Genevie at [facility] and was told there was nothing on his brain imaging or tests to suggest a neurocognitive disorder.   Over the past year, has noted worsening distractibility and dual-tasking ability. Pt easily distracted while driving and is forgetting things at home. Has  noted anomia as  well.    Has noted worsening balance recently. Had a L TKA in October 2023 and received PT at [facility]. Also works out at [employer]. Used to be a runner, ran marathons. Now cannot run on TM without tripping, so is walking instead. Uses the bike and the elliptical.   Trips often and has had a few falls. Usually trips over his LLE, but will trip over RLE. Most recently had a mishap in which he lost his balance and landed poorly on his LLE, had to have his knee aspirated after. Frequently trips on steps, almost fell at the [facility].     Pt accompanied by: self  PERTINENT HISTORY: Adjustment disorder depressed type, OCPD, GAD, anxiety, CAD, GERD  From Dr. 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 His neurological examination is grossly nonfocal. He had some mild apraxia with demonstrating how to ride a bicycle, though no other overt ideomotor apraxia with things like demonstrating how to use tools or other in transit and gestures. The only other gesture that he stumbled with was showing the [facility] greeting from [employer], which he said he has always had a problem demonstrating. His cognitive testing was within cognitively normal range, losing points for missing 2/4 calculations, having an impaired figure cube drawing, and missing 1/4 of the recall items. We were all surprised that he struggled with missing half of the calculations, since math has historically been a strong suit of his. His cognitive screen is within that of a cognitively normal range, though  given the changes that they describe, he meets criteria for a mild cognitive impairment. He has been diagnosed with medical comorbidities that could be contributing to impaired attention and focus, such as attention deficit hyperactivity disorder and obstructive sleep apnea. He is currently not being treated for ADHD and had an overstimulating response when put on Ritalin  previously. He has an oral appliance for OSA, since his OSA is not bad enough to necessitate using CPAP, so this is being addressed. There is still concern that he could have a early neurodegenerative dementing process, though his FDG PET scan performed in 2021 that was read as normal would argue against that. He has treatment-behavior, which could be consistent with REM disorder that can be an early sign of cynically neuropathy, something like dementia with Lewy bodies. 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 I am less worried about something like Alzheimer disease.  I recommended the following work-up, including imaging of the brain and comprehensive neuropsychological evaluation. I also recommended he be re-evaluated by sleep medicine, specifically if this is pseudo-REM disorder secondary to obstructive sleep apnea (OSA) versus REM disorder. I prescribed atomoxetine (Strattera) with titration up to 40 mg twice daily to address ADHD. I also recommended he seek out physical therapy locally to assist with things like balance and gait. I encourage cognitive and physical exercise, social engagement, and the Mediterranean/MIND diets for cognitive health.   From   PAIN:  Are you having pain?  No  PRECAUTIONS: Fall  RED FLAGS: None   WEIGHT BEARING RESTRICTIONS: No  FALLS: Has patient fallen in last 6 months? Yes. Number of falls  1  LIVING ENVIRONMENT: Lives with: lives with their spouse Lives in: House/apartment Stairs: Yes: External: 6 in front, 3 in utility room and 2 in garage  steps; bilateral but cannot reach both Has following equipment at home: None  PLOF: Independent  PATIENT GOALS: I wanna feel safer walking. I want to find out what are my true limitations   OBJECTIVE:  Note: Objective measures were completed at Evaluation unless otherwise noted.  DIAGNOSTIC FINDINGS: MRI of brain completed on 05/13/24, not interpreted at time of eval   COGNITION: Overall cognitive status: Within functional limits for tasks assessed and noted difficulty w/alternating attention. Pt reports anomia and distractibility as well    SENSATION: Pt denies numbness/tingling in BUEs   COORDINATION: Impaired fine motor coordination of RUE   MUSCLE TONE: RLE: Muscle atrophy noted of R quad    POSTURE: No Significant postural limitations  LOWER EXTREMITY ROM:     Active  Right Eval Left Eval  Hip flexion    Hip extension    Hip abduction    Hip adduction    Hip internal rotation    Hip external rotation    Knee flexion    Knee extension    Ankle dorsiflexion    Ankle plantarflexion    Ankle inversion    Ankle eversion     (Blank rows = not tested)  LOWER EXTREMITY MMT:  Tested in seated position   MMT Right Eval Left Eval  Hip flexion 4- 4  Hip extension    Hip abduction 4   Hip adduction    Hip internal rotation    Hip external rotation    Knee flexion    Knee extension    Ankle dorsiflexion    Ankle plantarflexion    Ankle inversion    Ankle eversion    (Blank rows = not tested)  BED MOBILITY:  Not tested Pt reports he has to be careful due to size and height on bed.   TRANSFERS: Sit to stand: Modified independence  Assistive device utilized: None     Stand to sit: Modified independence  Assistive device utilized: None      RAMP:  Not tested  CURB:  Not tested  STAIRS: Not  tested GAIT: Gait pattern: WFL Distance walked: Various clinic distances  Assistive device utilized: None Level of assistance: Modified independence Comments: To be further assessed in future session    FUNCTIONAL TESTS:  MCTSIB, 5x STS and FGA to be assessed                                                                                                                               TREATMENT:   Self-care/home management  Provided therapeutic listening as pt provided subjective history, recent testing and concerns for neurocognitive disorder  Let pt review most recent brain MRI  Discussed plan for PT and 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    PATIENT EDUCATION: Education details: PT POC, eval findings  Person educated: Patient Education method: Medical Illustrator Education comprehension: verbalized understanding  HOME EXERCISE PROGRAM: To be established   GOALS: Goals reviewed with patient? Yes  SHORT TERM GOALS: Target date: 06/11/2024   Pt will be independent with initial HEP for improved strength, balance, transfers and gait.  Baseline: Goal status: INITIAL  2.  MCTSIB to be assessed and STG/LTG updated  Baseline:  Goal status: INITIAL  3.  5x STS to be assessed and STG/LTG updated  Baseline:  Goal status: INITIAL  4.  FGA to be assessed and STG/LTG updated  Baseline:  Goal status: INITIAL    LONG TERM GOALS: Target date: 07/09/2024   Pt will be independent with final HEP for improved strength, balance, transfers and gait.  Baseline:  Goal status: INITIAL  2.  MCTSIB goal  Baseline:  Goal status: INITIAL  3.  FGA goal Baseline:  Goal status: INITIAL  4.  5x STS goal  Baseline:  Goal status: INITIAL   ASSESSMENT:  CLINICAL IMPRESSION: Objective portion of evaluation limited as majority of time spent reviewing pt's subjective report and  recent imaging. Patient is a 68 year old male referred to Neuro OPPT for abnormalities of gait and mobility. Pt's PMH is significant for: Adjustment disorder depressed type, OCPD, GAD, anxiety, CAD, GERD. The following deficits were present during the exam: muscle atrophy of RLE, decreased functional strength, impaired alternating attention and history of falling. Based on pt's report of recent falls, pt is an incr risk for falls. Balance to be further assessed in future sessions. Pt would benefit from skilled PT to address these impairments and functional limitations to maximize functional mobility independence.    OBJECTIVE IMPAIRMENTS: decreased activity tolerance, decreased balance, decreased cognition, decreased coordination, decreased knowledge of condition, decreased knowledge of use of DME, difficulty walking, decreased strength, impaired perceived functional ability, impaired tone, and improper body mechanics  ACTIVITY LIMITATIONS: stairs, transfers, bed mobility, locomotion level, and caring for others  PARTICIPATION LIMITATIONS: interpersonal relationship, driving, shopping, community activity, occupation, and yard work  PERSONAL FACTORS: Behavior pattern and 1-2 comorbidities: GAD, OCPD and Adjustment disorder are also affecting patient's functional outcome.   REHAB POTENTIAL: Good  CLINICAL DECISION MAKING: Evolving/moderate complexity  EVALUATION COMPLEXITY: Moderate  PLAN:  PT FREQUENCY: 2x/week  PT DURATION: 6 weeks (POC written for 10 weeks due to delay in scheduling over holidays)  PLANNED INTERVENTIONS: 02835- PT Re-evaluation, 97750- Physical Performance Testing, 97110-Therapeutic exercises, 97530- Therapeutic activity, [id number]- Neuromuscular re-education, 97535- Self Care, 02859- Manual therapy, [id number]- Gait training, [phone]- Orthotic Initial, [phone]- Canalith repositioning, [id number]- Aquatic Therapy, [phone]- Electrical stimulation (manual), [phone] (1-2 muscles), [phone] (3+ muscles)-  Dry Needling, Patient/Family education, Balance training, Stair training, Joint mobilization, Spinal mobilization, Vestibular training, and DME instructions  PLAN FOR NEXT SESSION: Monitor vitals. Assess MCTSIB, FGA and 5x STS and update goals. Assess gait on TM    Tiah Heckel E Shimon Trowbridge, PT, DPT 05/15/2024, 8:15 AM

## 2024-05-15 DIAGNOSIS — F3289 Other specified depressive episodes: Secondary | ICD-10-CM | POA: Diagnosis not present

## 2024-05-15 DIAGNOSIS — F605 Obsessive-compulsive personality disorder: Secondary | ICD-10-CM | POA: Diagnosis not present

## 2024-05-28 ENCOUNTER — Other Ambulatory Visit (HOSPITAL_BASED_OUTPATIENT_CLINIC_OR_DEPARTMENT_OTHER): Payer: Self-pay

## 2024-06-03 ENCOUNTER — Ambulatory Visit: Admitting: Physical Therapy

## 2024-06-03 DIAGNOSIS — Z9181 History of falling: Secondary | ICD-10-CM | POA: Diagnosis present

## 2024-06-03 DIAGNOSIS — M6281 Muscle weakness (generalized): Secondary | ICD-10-CM | POA: Diagnosis present

## 2024-06-03 DIAGNOSIS — R2689 Other abnormalities of gait and mobility: Secondary | ICD-10-CM | POA: Insufficient documentation

## 2024-06-03 DIAGNOSIS — R2681 Unsteadiness on feet: Secondary | ICD-10-CM | POA: Diagnosis present

## 2024-06-03 NOTE — Therapy (Signed)
 OUTPATIENT PHYSICAL THERAPY NEURO TREATMENT   Patient Name: Timothy [name] MRN: [id number] DOB:02-25-1956, 68 y.o., male Today's Date: 06/03/2024   PCP: Charlott Dorn LABOR, MD REFERRING PROVIDER: Charlott Dorn LABOR, MD  END OF SESSION:  PT End of Session - 06/03/24 1316     Visit Number 2    Number of Visits 13    Date for Recertification  07/23/24   Due to delay in scheduling   Authorization Type BCBS Medicare    PT Start Time 1315    PT Stop Time 1403    PT Time Calculation (min) 48 min    Activity Tolerance Patient tolerated treatment well    Behavior During Therapy [facility] for tasks assessed/performed          Past Medical History:  Diagnosis Date   Acute meniscal tear of knee    Anxiety    Asthma    CAD (coronary artery disease)    Cancer (HCC)    Cataract    Environmental allergies    GERD (gastroesophageal reflux disease)    History of basal cell carcinoma (BCC) excision    History of detached retina repair    left s/p laser 2019;  right s/p surgical repair 02/ 2015   History of malignant melanoma of skin    04-26-2019 per pt fall 2019 excision from back, localized, and no recurrence   History of prostate cancer urologist --- dr renda   dx 2016-- Stage T1c, Gleason 3+3;  05-11-2015 s/p  radical prostatectomy  (04-26-2019 per pt psa undetectable)   Hx of adenomatous colonic polyps 01/18/2017   Hyperlipidemia    Low platelet count    bruising   Mild obstructive sleep apnea    no longer has to use CPAP wt. loss (pt retested 10-30-2014 epic)   OA (osteoarthritis)    Sleep apnea    oral appliance    Thrombocytopenia, unspecified    04-26-2019  per pt baseline plt count 150   Type 2 diabetes mellitus (HCC)    followed by pcp  (04-26-2019 check's cbg daily,  fasting cbg-- 110-120;  per pt last A1c 5.8)   Wears glasses    Past Surgical History:  Procedure Laterality Date   CATARACT EXTRACTION W/ INTRAOCULAR LENS  IMPLANT, BILATERAL  01/2018    COLONOSCOPY  last one 01-12-2017   GAS INSERTION Right 08/19/2013   Procedure: INSERTION OF GAS;  Surgeon: Arley LABOR Ruder, MD;  Location: [facility] OR;  Service: Ophthalmology;  Laterality: Right;  SF6   KNEE ARTHROSCOPY Left 05/02/2019   Procedure: ARTHROSCOPY KNEE evaluation under anesthesia debridement partial medial meniscectomy chondroplasty;  Surgeon: Gerome Charleston, MD;  Location: [facility];  Service: Orthopedics;  Laterality: Left;  Anesthesia - Femoral nerve block/knee block/IV sedation   LEFT HEART CATH AND CORONARY ANGIOGRAPHY N/A 08/15/2023   Procedure: LEFT HEART CATH AND CORONARY ANGIOGRAPHY;  Surgeon: [name], MD;  Location: [facility] INVASIVE CV LAB;  Service: Cardiovascular;  Laterality: N/A;   MASS EXCISION Left 07/08/2014   Procedure: EXCISION OF KERATOACANTHOMA LEFT LOWER LEG, AND EXCISION OF BASIL CELL CARCINOMA FROM NECK;  Surgeon: [name], MD;  Location: [facility];  Service: General;  Laterality: Left;  left lower leg and posterior neck   NASAL SEPTOPLASTY W/ TURBINOPLASTY  12-13-2001  dr byers @[facility]    PROSTATE BIOPSY  11/11/2014   REPLACEMENT TOTAL KNEE     ROBOT ASSISTED LAPAROSCOPIC RADICAL PROSTATECTOMY N/A 05/11/2015   Procedure: ROBOTIC ASSISTED LAPAROSCOPIC RADICAL PROSTATECTOMY LEVEL  1;  Surgeon: [name], MD;  Location: [facility] ORS;  Service: Urology;  Laterality: N/A;   SCLERAL BUCKLE WITH CRYO Right 08/19/2013   Procedure: SCLERAL BUCKLE WITH CRYOPEXY;  Surgeon: [name], MD;  Location: [facility] OR;  Service: Ophthalmology;  Laterality: Right;   TONSILLECTOMY AND ADENOIDECTOMY  chiuld   Patient Active Problem List   Diagnosis Date Noted   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   Dry mouth 11/26/2019   Nasal valve collapse 11/26/2019   Sensorineural hearing loss (SNHL) of left ear 11/26/2019   Acquired trigger finger of right middle finger 05/27/2019   Pain in finger of right hand 05/27/2019   Pain in left knee 04/08/2019   Hx of  adenomatous colonic polyps 01/18/2017   Prostate cancer (HCC) 05/11/2015   Malignant neoplasm of prostate (HCC) 12/18/2014   Cough 09/10/2014   Keratoacanthoma of lower leg, 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   Basal cell carcinoma of neck 04/24/2014   Haglund's deformity of right heel 01/16/2014   FOOT PAIN, BILATERAL 04/12/2010   NEOPLASM OF UNCERTAIN BEHAVIOR OF SKIN 10/28/2009   HYPOGONADISM 10/28/2009   Infective otitis externa 10/28/2009   CERUMEN IMPACTION 10/28/2009   ACHILLES TENDINITIS 02/11/2009   PLANTAR FASCIITIS, RIGHT 02/11/2009   Other specified abnormal findings of blood chemistry 08/15/2008   ABNORMAL LIVER FUNCTION TESTS 08/15/2008   OBSTRUCTIVE SLEEP APNEA 10/30/2007   DIABETES MELLITUS, TYPE II 02/03/2007   DYSLIPIDEMIA 02/03/2007   DISORDERS, 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   Asthma 02/03/2007   MICROALBUMINURIA 02/03/2007    ONSET DATE: 05/06/2024 (referral)   REFERRING DIAG:  R26.89 (ICD-10-CM) - Other abnormalities of gait and mobility    THERAPY DIAG:  Unsteadiness on feet  Muscle weakness (generalized)  Other abnormalities of gait and mobility  History of falling  Rationale for Evaluation and Treatment: Rehabilitation  SUBJECTIVE:                                                                                                                                                                                             SUBJECTIVE STATEMENT: Pt reports he received good news from neurologist at [facility]. His MRI is unchanged from 2021 and his cognitive testing is also unchanged, suggesting he does not have a progressive neurocognitive disease. Pt reports one fall since last session, was carrying a heavy load of laundry down the steps and tripped but was able to catch himself without injury. Tripped over his LLE.  Denies pain today.     Pt accompanied by: self  PERTINENT HISTORY: Adjustment disorder depressed type, OCPD, GAD, anxiety, CAD, GERD  From Dr.  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 His neurological examination is grossly nonfocal. He had some mild apraxia with demonstrating how to ride a bicycle, though no other overt ideomotor apraxia with things like demonstrating how to use tools or other in transit and gestures. The only other gesture that he stumbled with was showing the [facility] greeting from [employer], which he said he has always had a problem demonstrating. His cognitive testing was within cognitively normal range, losing points for missing 2/4 calculations, having an impaired figure cube drawing, and missing 1/4 of the recall items. We were all surprised that he struggled with missing half of the calculations, since math has historically been a strong suit of his. His cognitive screen is within that of a cognitively normal range, though given the changes that they describe, he meets criteria for a mild cognitive impairment. He has been diagnosed with medical comorbidities that could be contributing to impaired attention and focus, such as attention deficit hyperactivity disorder and obstructive sleep apnea. He is currently not being treated for ADHD and had an overstimulating response when put on Ritalin  previously. He has an oral appliance for OSA, since his OSA is not bad enough to necessitate using CPAP, so this is being addressed. There is still concern that he could have a early neurodegenerative dementing process, though his FDG PET scan performed in 2021 that was read as normal would argue against that. He has  treatment-behavior, which could be consistent with REM disorder that can be an early sign of cynically neuropathy, something like dementia with Lewy bodies. 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 I am less worried about something like Alzheimer disease.  I recommended the following work-up, including imaging of the brain and comprehensive neuropsychological evaluation. I also recommended he be re-evaluated by sleep medicine, specifically if this is pseudo-REM disorder secondary to obstructive sleep apnea (OSA) versus REM disorder. I prescribed atomoxetine  (Strattera ) with titration up to 40 mg twice daily to address ADHD. I also recommended he seek out physical therapy locally to assist with things like balance and gait. I encourage cognitive and physical exercise, social engagement, and the Mediterranean/MIND diets for cognitive health.   From   PAIN:  Are you having pain? No  PRECAUTIONS: Fall  RED FLAGS: None   WEIGHT BEARING RESTRICTIONS: No  FALLS: Has patient fallen in last 6 months? Yes. Number of falls 1  LIVING ENVIRONMENT: Lives with: lives with their spouse Lives in: House/apartment Stairs: Yes: External: 6 in front, 3 in utility room and 2 in garage  steps; bilateral but cannot reach both Has following equipment at home: None  PLOF: Independent  PATIENT GOALS: I wanna feel safer walking. I want to find out what are my true limitations   OBJECTIVE:  Note: Objective measures were completed at Evaluation unless otherwise noted.  DIAGNOSTIC FINDINGS: MRI of brain completed on 05/13/24, not interpreted at time of eval   COGNITION: Overall cognitive status: Within functional limits for tasks assessed and noted difficulty w/alternating  attention. Pt reports anomia  and distractibility as well    SENSATION: Pt denies numbness/tingling in BUEs   COORDINATION: Impaired fine motor coordination of RUE   MUSCLE TONE: RLE: Muscle atrophy noted of R quad    POSTURE: No Significant postural limitations  LOWER EXTREMITY ROM:     Active  Right Eval Left Eval  Hip flexion    Hip extension    Hip abduction    Hip adduction    Hip internal rotation    Hip external rotation    Knee flexion    Knee extension    Ankle dorsiflexion    Ankle plantarflexion    Ankle inversion    Ankle eversion     (Blank rows = not tested)  LOWER EXTREMITY MMT:  Tested in seated position   MMT Right Eval Left Eval  Hip flexion 4- 4  Hip extension    Hip abduction 4   Hip adduction    Hip internal rotation    Hip external rotation    Knee flexion    Knee extension    Ankle dorsiflexion    Ankle plantarflexion    Ankle inversion    Ankle eversion    (Blank rows = not tested)  BED MOBILITY:  Not tested Pt reports he has to be careful due to size and height on bed.   TRANSFERS: Sit to stand: Modified independence  Assistive device utilized: None     Stand to sit: Modified independence  Assistive device utilized: None      RAMP:  Not tested  CURB:  Not tested  STAIRS: Not tested GAIT: Gait pattern: WFL Distance walked: Various clinic distances  Assistive device utilized: None Level of assistance: Modified independence Comments: To be further assessed in future session    FUNCTIONAL TESTS:  MCTSIB, 5x STS and FGA to be assessed                                                                                                                               TREATMENT:   Physical Performance/Ther Act   [facility] PT Assessment - 06/03/24 1320       Transfers   Five time sit to stand comments  11.62s   No UE support, RLE posterior, minor retropulsion     Balance   Balance Assessed Yes      Standardized Balance Assessment   Standardized  Balance Assessment Mini-BESTest      Mini-BESTest   Sit To Stand Normal: Comes to stand without use of hands and stabilizes independently.    Rise to Toes Normal: Stable for 3 s with maximum height.    Stand on one leg (left) Moderate: < 20 s   3.6s   Stand on one leg (right) Normal: 20 s.    Stand on one leg - lowest score 1    Stance - Feet together, eyes open, firm surface  Normal: 30s    Stance -  Feet together, eyes closed, foam surface  Normal: 30s    Incline - Eyes Closed Normal: Stands independently 30s and aligns with gravity    Change in Gait Speed Normal: Significantly changes walkling speed without imbalance    Walk with head turns - Horizontal Normal: performs head turns with no change in gait speed and good balance    Walk with pivot turns Normal: Turns with feet close FAST (< 3 steps) with good balance.    Step over obstacles Normal: Able to step over box with minimal change of gait speed and with good balance.         MCTSIB: Condition 1: Avg of 3 trials: 30 sec, Condition 2: Avg of 3 trials: 30 sec (min A/P sway), Condition 3: Avg of 3 trials: 30 sec, Condition 4: Avg of 3 trials: 30 sec (min A/P sway, more in posterior direction), and Total Score: 120/120  During MiniBest assessment, noted inferior tilt of R pelvis w/L truncal lean compensation to maintain midline orientation. Had pt stand on rockerboard in L/R orientation and pt demonstrated lateral LOB to R side due to decreased weightbearing on LLE. Discussed potential for leg length discrepancy in pt, which could be anatomical or functional following L TKA. Assessed leg length in supine (from ASIS to lateral malleoli) and measured 98cm on RLE and ~100cm on LLE. Provided photo of pt's standing posture and supine posture to provide visual biofeedback on body position. Lengthy discussion regarding impact that a functional leg length discrepancy could have, as pt has poor eccentric control on RLE and tends to trip over LLE.  Discussed plan to work on eccentric control on LLE and posterior chain strength in PT to improve hip stability and improve safety w/declines.    STAIRS:  Level of Assistance: Modified independence  Stair Negotiation Technique: Alternating Pattern  with No Rails  Number of Stairs: 12   Height of Stairs: 6  Comments: No difficulty w/ascending, but noted poor eccentric control when descending, especially on LLE.    PATIENT EDUCATION: Education details: OM results, potential for leg length discrepancy, plan for PT  Person educated: Patient Education method: Explanation, Demonstration, and Verbal cues Education comprehension: verbalized understanding, returned demonstration, and verbal cues required  HOME EXERCISE PROGRAM: To be established   GOALS: Goals reviewed with patient? Yes  SHORT TERM GOALS: Target date: 06/11/2024   Pt will be independent with initial HEP for improved strength, balance, transfers and gait.  Baseline: Goal status: INITIAL  2.  MCTSIB to be assessed and STG/LTG updated  Baseline: 120/120 Goal status: DC DUE TO HIGH BASELINE SCORE   3.  5x STS to be assessed and LTG updated  Baseline:  Goal status: MET  4.  Minibest to be assessed and STG/LTG updated  Baseline:  Goal status: INITIAL    LONG TERM GOALS: Target date: 07/09/2024   Pt will be independent with final HEP for improved strength, balance, transfers and gait.  Baseline:  Goal status: INITIAL  2.  MCTSIB goal  Baseline:  Goal status: DC DUE TO HIGH BASELINE SCORE   3.  MiniBest goal Baseline:  Goal status: INITIAL  4. Pt will improve 5 x STS to less than or equal to 10 seconds w/equal foot placement and no retropulsion to demonstrate improved functional strength and transfer efficiency.   Baseline: 11.62s w/RLE posterior and minor retropulsion  Goal status: INITIAL   ASSESSMENT:  CLINICAL IMPRESSION: Emphasis of skilled PT session on assessing balance and functional BLE  strength  via 5x STS, MCTSIB and MiniBest. During 5x STS, noted pt placed RLE posterior to L and had minor retropulsion. Pt achieved a perfect score on MCTSIB, indicative of functional vestibular and somatosensory systems. During assessment of Minibest, noted inferior R pelvic tilt w/L truncal lean compensation. Upon further assessment, noted ~2cm leg length discrepancy, w/RLE being shorter than L. Pt demonstrates poor eccentric control of L quads, resulting in forceful IC on RLE. Pt also frequently trips over LLE, which could be due to leg length difference. Will continue to focus on eccentric control of quads and glute med strength for improved stability on declines, unlevel surfaces and transfers for reduced fall frequency. Continue POC.   OBJECTIVE IMPAIRMENTS: decreased activity tolerance, decreased balance, decreased cognition, decreased coordination, decreased knowledge of condition, decreased knowledge of use of DME, difficulty walking, decreased strength, impaired perceived functional ability, impaired tone, and improper body mechanics  ACTIVITY LIMITATIONS: stairs, transfers, bed mobility, locomotion level, and caring for others  PARTICIPATION LIMITATIONS: interpersonal relationship, driving, shopping, community activity, occupation, and yard work  PERSONAL FACTORS: Behavior pattern and 1-2 comorbidities: GAD, OCPD and Adjustment disorder are also affecting patient's functional outcome.   REHAB POTENTIAL: Good  CLINICAL DECISION MAKING: Evolving/moderate complexity  EVALUATION COMPLEXITY: Moderate  PLAN:  PT FREQUENCY: 2x/week  PT DURATION: 6 weeks (POC written for 10 weeks due to delay in scheduling over holidays)  PLANNED INTERVENTIONS: 02835- PT Re-evaluation, 97750- Physical Performance Testing, 97110-Therapeutic exercises, 97530- Therapeutic activity, [id number]- Neuromuscular re-education, 97535- Self Care, 02859- Manual therapy, [id number]- Gait training, [phone]- Orthotic Initial, [phone]-  Canalith repositioning, [id number]- Aquatic Therapy, [phone]- Electrical stimulation (manual), [phone] (1-2 muscles), [phone] (3+ muscles)- Dry Needling, Patient/Family education, Balance training, Stair training, Joint mobilization, Spinal mobilization, Vestibular training, and DME instructions  PLAN FOR NEXT SESSION: Monitor vitals. Gait on TM. Work on eccentric control of quads - heel taps, single leg RDLs, single leg bridges, bird dogs, multifidi rotations, squats, rockerboard    Desmon Hitchner E Margaretann Abate, PT, DPT 06/03/2024, 2:57 PM

## 2024-06-13 ENCOUNTER — Ambulatory Visit: Admitting: Physical Therapy

## 2024-06-13 DIAGNOSIS — R2681 Unsteadiness on feet: Secondary | ICD-10-CM | POA: Diagnosis not present

## 2024-06-13 DIAGNOSIS — R2689 Other abnormalities of gait and mobility: Secondary | ICD-10-CM

## 2024-06-13 DIAGNOSIS — M6281 Muscle weakness (generalized): Secondary | ICD-10-CM

## 2024-06-13 NOTE — Therapy (Signed)
 OUTPATIENT PHYSICAL THERAPY NEURO TREATMENT   Patient Name: Timothy [name] MRN: [id number] DOB:[birth year], 68 y.o., male Today's Date: 06/13/2024   PCP: Charlott Dorn LABOR, MD REFERRING PROVIDER: Charlott Dorn LABOR, MD  END OF SESSION:  PT End of Session - 06/13/24 1523     Visit Number 3    Number of Visits 13    Date for Recertification  07/23/24   Due to delay in scheduling   Authorization Type BCBS Medicare    PT Start Time 1522    PT Stop Time 1611    PT Time Calculation (min) 49 min    Activity Tolerance Patient tolerated treatment well    Behavior During Therapy [facility] for tasks assessed/performed          Past Medical History:  Diagnosis Date   Acute meniscal tear of knee    Anxiety    Asthma    CAD (coronary artery disease)    Cancer (HCC)    Cataract    Environmental allergies    GERD (gastroesophageal reflux disease)    History of basal cell carcinoma (BCC) excision    History of detached retina repair    left s/p laser 2019;  right s/p surgical repair 02/ 2015   History of malignant melanoma of skin    04-26-2019 per pt fall 2019 excision from back, localized, and no recurrence   History of prostate cancer urologist --- dr renda   dx 2016-- Stage T1c, Gleason 3+3;  05-11-2015 s/p  radical prostatectomy  (04-26-2019 per pt psa undetectable)   Hx of adenomatous colonic polyps 01/18/2017   Hyperlipidemia    Low platelet count    bruising   Mild obstructive sleep apnea    no longer has to use CPAP wt. loss (pt retested 10-30-2014 epic)   OA (osteoarthritis)    Sleep apnea    oral appliance    Thrombocytopenia, unspecified    04-26-2019  per pt baseline plt count 150   Type 2 diabetes mellitus (HCC)    followed by pcp  (04-26-2019 check's cbg daily,  fasting cbg-- 110-120;  per pt last A1c 5.8)   Wears glasses    Past Surgical History:  Procedure Laterality Date   CATARACT EXTRACTION W/ INTRAOCULAR LENS  IMPLANT, BILATERAL  01/2018    COLONOSCOPY  last one 01-12-2017   GAS INSERTION Right 08/19/2013   Procedure: INSERTION OF GAS;  Surgeon: Arley LABOR Ruder, MD;  Location: [facility] OR;  Service: Ophthalmology;  Laterality: Right;  SF6   KNEE ARTHROSCOPY Left 05/02/2019   Procedure: ARTHROSCOPY KNEE evaluation under anesthesia debridement partial medial meniscectomy chondroplasty;  Surgeon: Gerome Charleston, MD;  Location: [facility];  Service: Orthopedics;  Laterality: Left;  Anesthesia - Femoral nerve block/knee block/IV sedation   LEFT HEART CATH AND CORONARY ANGIOGRAPHY N/A 08/15/2023   Procedure: LEFT HEART CATH AND CORONARY ANGIOGRAPHY;  Surgeon: [name], MD;  Location: [facility] INVASIVE CV LAB;  Service: Cardiovascular;  Laterality: N/A;   MASS EXCISION Left 07/08/2014   Procedure: EXCISION OF KERATOACANTHOMA LEFT LOWER LEG, AND EXCISION OF BASIL CELL CARCINOMA FROM NECK;  Surgeon: [name], MD;  Location: [facility];  Service: General;  Laterality: Left;  left lower leg and posterior neck   NASAL SEPTOPLASTY W/ TURBINOPLASTY  12-13-2001  dr byers @[facility]    PROSTATE BIOPSY  11/11/2014   REPLACEMENT TOTAL KNEE     ROBOT ASSISTED LAPAROSCOPIC RADICAL PROSTATECTOMY N/A 05/11/2015   Procedure: ROBOTIC ASSISTED LAPAROSCOPIC RADICAL PROSTATECTOMY LEVEL  1;  Surgeon: [name], MD;  Location: [facility] ORS;  Service: Urology;  Laterality: N/A;   SCLERAL BUCKLE WITH CRYO Right 08/19/2013   Procedure: SCLERAL BUCKLE WITH CRYOPEXY;  Surgeon: [name], MD;  Location: [facility] OR;  Service: Ophthalmology;  Laterality: Right;   TONSILLECTOMY AND ADENOIDECTOMY  chiuld   Patient Active Problem List   Diagnosis Date Noted   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   Dry mouth 11/26/2019   Nasal valve collapse 11/26/2019   Sensorineural hearing loss (SNHL) of left ear 11/26/2019   Acquired trigger finger of right middle finger 05/27/2019   Pain in finger of right hand 05/27/2019   Pain in left knee 04/08/2019   Hx of  adenomatous colonic polyps 01/18/2017   Prostate cancer (HCC) 05/11/2015   Malignant neoplasm of prostate (HCC) 12/18/2014   Cough 09/10/2014   Keratoacanthoma of lower leg, 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   Basal cell carcinoma of neck 04/24/2014   Haglund's deformity of right heel 01/16/2014   FOOT PAIN, BILATERAL 04/12/2010   NEOPLASM OF UNCERTAIN BEHAVIOR OF SKIN 10/28/2009   HYPOGONADISM 10/28/2009   Infective otitis externa 10/28/2009   CERUMEN IMPACTION 10/28/2009   ACHILLES TENDINITIS 02/11/2009   PLANTAR FASCIITIS, RIGHT 02/11/2009   Other specified abnormal findings of blood chemistry 08/15/2008   ABNORMAL LIVER FUNCTION TESTS 08/15/2008   OBSTRUCTIVE SLEEP APNEA 10/30/2007   DIABETES MELLITUS, TYPE II 02/03/2007   DYSLIPIDEMIA 02/03/2007   DISORDERS, 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   Asthma 02/03/2007   MICROALBUMINURIA 02/03/2007    ONSET DATE: 05/06/2024 (referral)   REFERRING DIAG:  R26.89 (ICD-10-CM) - Other abnormalities of gait and mobility    THERAPY DIAG:  Unsteadiness on feet  Muscle weakness (generalized)  Other abnormalities of gait and mobility  Rationale for Evaluation and Treatment: Rehabilitation  SUBJECTIVE:                                                                                                                                                                                             SUBJECTIVE STATEMENT: Pt reports doing well. Denies falls or acute changes.   Pt accompanied by: self  PERTINENT HISTORY: Adjustment disorder depressed type, OCPD, GAD, anxiety, CAD, GERD  From Dr. 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 His neurological  examination is grossly nonfocal. He had some mild apraxia with demonstrating how to ride a bicycle, though no other overt ideomotor apraxia with things like demonstrating how to use tools or other in transit and gestures. The only other gesture that he stumbled with was showing the [facility] greeting from [employer], which he said he has always had a problem demonstrating. His cognitive testing was within cognitively normal range, losing points for missing 2/4 calculations, having an impaired figure cube drawing, and missing 1/4 of the recall items. We were all surprised that he struggled with missing half of the calculations, since math has historically been a strong suit of his. His cognitive screen is within that of a cognitively normal range, though given the changes that they describe, he meets criteria for a mild cognitive impairment. He has been diagnosed with medical comorbidities that could be contributing to impaired attention and focus, such as attention deficit hyperactivity disorder and obstructive sleep apnea. He is currently not being treated for ADHD and had an overstimulating response when put on Ritalin  previously. He has an oral appliance for OSA, since his OSA is not bad enough to necessitate using CPAP, so this is being addressed. There is still concern that he could have a early neurodegenerative dementing process, though his FDG PET scan performed in 2021 that was read as normal would argue against that. He has treatment-behavior, which could be consistent with REM disorder that can be an early sign of cynically neuropathy, something like dementia with Lewy bodies. Because of the described apraxia and difficulty with balance and coordination, he could have something like corticobasal syndrome, though he is not demonstrating alien limb phenomenon or parkinsonism that might otherwise  be seen with this disorder. I am less worried about something like Alzheimer disease.  I recommended the following work-up, including imaging of the brain and comprehensive neuropsychological evaluation. I also recommended he be re-evaluated by sleep medicine, specifically if this is pseudo-REM disorder secondary to obstructive sleep apnea (OSA) versus REM disorder. I prescribed atomoxetine  (Strattera ) with titration up to 40 mg twice daily to address ADHD. I also recommended he seek out physical therapy locally to assist with things like balance and gait. I encourage cognitive and physical exercise, social engagement, and the Mediterranean/MIND diets for cognitive health.   From   PAIN:  Are you having pain? No  PRECAUTIONS: Fall  RED FLAGS: None   WEIGHT BEARING RESTRICTIONS: No  FALLS: Has patient fallen in last 6 months? Yes. Number of falls 1  LIVING ENVIRONMENT: Lives with: lives with their spouse Lives in: House/apartment Stairs: Yes: External: 6 in front, 3 in utility room and 2 in garage  steps; bilateral but cannot reach both Has following equipment at home: None  PLOF: Independent  PATIENT GOALS: I wanna feel safer walking. I want to find out what are my true limitations   OBJECTIVE:  Note: Objective measures were completed at Evaluation unless otherwise noted.  DIAGNOSTIC FINDINGS: MRI of brain completed on 05/13/24, not interpreted at time of eval   COGNITION: Overall cognitive status: Within functional limits for tasks assessed and noted difficulty w/alternating attention. Pt reports anomia and distractibility as well    SENSATION: Pt denies numbness/tingling in BUEs   COORDINATION: Impaired fine motor coordination of RUE   MUSCLE TONE: RLE: Muscle atrophy noted of R quad    POSTURE: No Significant postural limitations  LOWER EXTREMITY ROM:     Active  Right Eval Left Eval  Hip flexion  Hip extension    Hip abduction    Hip adduction    Hip  internal rotation    Hip external rotation    Knee flexion    Knee extension    Ankle dorsiflexion    Ankle plantarflexion    Ankle inversion    Ankle eversion     (Blank rows = not tested)  LOWER EXTREMITY MMT:  Tested in seated position   MMT Right Eval Left Eval  Hip flexion 4- 4  Hip extension    Hip abduction 4   Hip adduction    Hip internal rotation    Hip external rotation    Knee flexion    Knee extension    Ankle dorsiflexion    Ankle plantarflexion    Ankle inversion    Ankle eversion    (Blank rows = not tested)  BED MOBILITY:  Not tested Pt reports he has to be careful due to size and height on bed.   TRANSFERS: Sit to stand: Modified independence  Assistive device utilized: None     Stand to sit: Modified independence  Assistive device utilized: None      RAMP:  Not tested  CURB:  Not tested  STAIRS: Not tested GAIT: Gait pattern: WFL Distance walked: Various clinic distances  Assistive device utilized: None Level of assistance: Modified independence Comments: To be further assessed in future session    FUNCTIONAL TESTS:  MCTSIB, 5x STS and FGA to be assessed                                                                                                                               TREATMENT:   Ther Act/Ex  The following treadmill training was completed for aerobic/neural priming, endurance, and gait speed.  - Warmup: 3:00 up to 2.7 mph - 5:00 up to 3.8 mph and 3% grade   Eccentric heel taps - 8 and 6 in fwd and lateral direction. Increased difficulty on LLE  Step ups w/20# KB hold and contralateral march, min A  Suitcase carries, 3x115' per side  Spanish squats w/blue theraband  Bird dogs w/resistance       PATIENT EDUCATION: Education details: OM results, potential for leg length discrepancy, plan for PT  Person educated: Patient Education method: Explanation, Demonstration, and Verbal cues Education comprehension:  verbalized understanding, returned demonstration, and verbal cues required  HOME EXERCISE PROGRAM: Access Code: [email or web address] URL: [email or web address] Date: 06/13/2024 Prepared by: Marlon Kolbi Altadonna  Exercises - Bird Dog with Resistance  - 1 x daily - 7 x weekly - 3 sets - 6-8 reps - 2 second hold - Spanish Squat With Resistance  - 1 x daily - 7 x weekly - 3 sets - 10-12 reps - 2 seconds hold - Forward Step Down  - 1 x daily - 7 x weekly - 3 sets - 10-15 reps  GOALS: Goals reviewed with patient? Yes  SHORT TERM GOALS: Target date: 06/11/2024  Pt will be independent with initial HEP for improved strength, balance, transfers and gait.  Baseline: initiated on 06/13/24 Goal status: IN PROGRESS  2.  MCTSIB to be assessed and STG/LTG updated  Baseline: 120/120 Goal status: DC DUE TO HIGH BASELINE SCORE   3.  5x STS to be assessed and LTG updated  Baseline:  Goal status: MET  4.  Minibest to be assessed and STG/LTG updated  Baseline:  Goal status: INITIAL    LONG TERM GOALS: Target date: 07/09/2024   Pt will be independent with final HEP for improved strength, balance, transfers and gait.  Baseline:  Goal status: INITIAL  2.  MCTSIB goal  Baseline:  Goal status: DC DUE TO HIGH BASELINE SCORE   3.  MiniBest goal Baseline:  Goal status: INITIAL  4. Pt will improve 5 x STS to less than or equal to 10 seconds w/equal foot placement and no retropulsion to demonstrate improved functional strength and transfer efficiency.   Baseline: 11.62s w/RLE posterior and minor retropulsion  Goal status: INITIAL   ASSESSMENT:  CLINICAL IMPRESSION: Emphasis of skilled PT session on assessing balance and functional BLE strength via 5x STS, MCTSIB and MiniBest. During 5x STS, noted pt placed RLE posterior to L and had minor retropulsion. Pt achieved a perfect score on MCTSIB, indicative of functional vestibular and somatosensory systems. During assessment of Minibest, noted  inferior R pelvic tilt w/L truncal lean compensation. Upon further assessment, noted ~2cm leg length discrepancy, w/RLE being shorter than L. Pt demonstrates poor eccentric control of L quads, resulting in forceful IC on RLE. Pt also frequently trips over LLE, which could be due to leg length difference. Will continue to focus on eccentric control of quads and glute med strength for improved stability on declines, unlevel surfaces and transfers for reduced fall frequency. Continue POC.   OBJECTIVE IMPAIRMENTS: decreased activity tolerance, decreased balance, decreased cognition, decreased coordination, decreased knowledge of condition, decreased knowledge of use of DME, difficulty walking, decreased strength, impaired perceived functional ability, impaired tone, and improper body mechanics  ACTIVITY LIMITATIONS: stairs, transfers, bed mobility, locomotion level, and caring for others  PARTICIPATION LIMITATIONS: interpersonal relationship, driving, shopping, community activity, occupation, and yard work  PERSONAL FACTORS: Behavior pattern and 1-2 comorbidities: GAD, OCPD and Adjustment disorder are also affecting patient's functional outcome.   REHAB POTENTIAL: Good  CLINICAL DECISION MAKING: Evolving/moderate complexity  EVALUATION COMPLEXITY: Moderate  PLAN:  PT FREQUENCY: 2x/week  PT DURATION: 6 weeks (POC written for 10 weeks due to delay in scheduling over holidays)  PLANNED INTERVENTIONS: 02835- PT Re-evaluation, 97750- Physical Performance Testing, 97110-Therapeutic exercises, 97530- Therapeutic activity, [id number]- Neuromuscular re-education, 97535- Self Care, 02859- Manual therapy, [id number]- Gait training, [phone]- Orthotic Initial, [phone]- Canalith repositioning, [id number]- Aquatic Therapy, [phone]- Electrical stimulation (manual), [phone] (1-2 muscles), [id number] (3+ muscles)- Dry Needling, Patient/Family education, Balance training, Stair training, Joint mobilization, Spinal mobilization, Vestibular  training, and DME instructions  PLAN FOR NEXT SESSION: Monitor vitals. Gait on TM. Work on eccentric control of quads - heel taps, single leg RDLs, single leg bridges, bird dogs, multifidi rotations, squats, rockerboard    Nehemie Casserly E Anela Bensman, PT, DPT 06/13/2024, 4:15 PM

## 2024-06-16 ENCOUNTER — Ambulatory Visit (HOSPITAL_BASED_OUTPATIENT_CLINIC_OR_DEPARTMENT_OTHER): Attending: Pulmonary Disease | Admitting: Pulmonary Disease

## 2024-06-16 DIAGNOSIS — F518 Other sleep disorders not due to a substance or known physiological condition: Secondary | ICD-10-CM | POA: Diagnosis present

## 2024-06-16 DIAGNOSIS — R0683 Snoring: Secondary | ICD-10-CM | POA: Insufficient documentation

## 2024-06-16 DIAGNOSIS — G4752 REM sleep behavior disorder: Secondary | ICD-10-CM | POA: Diagnosis present

## 2024-06-16 DIAGNOSIS — G4761 Periodic limb movement disorder: Secondary | ICD-10-CM | POA: Insufficient documentation

## 2024-06-17 ENCOUNTER — Ambulatory Visit: Admitting: Physical Therapy

## 2024-06-17 DIAGNOSIS — M6281 Muscle weakness (generalized): Secondary | ICD-10-CM

## 2024-06-17 DIAGNOSIS — R2689 Other abnormalities of gait and mobility: Secondary | ICD-10-CM

## 2024-06-17 DIAGNOSIS — R2681 Unsteadiness on feet: Secondary | ICD-10-CM

## 2024-06-17 NOTE — Therapy (Signed)
 " OUTPATIENT PHYSICAL THERAPY NEURO TREATMENT   Patient Name: Timothy [name] MRN: [id number] DOB:04/30/1956, 68 y.o., male Today's Date: 06/17/2024   PCP: Charlott Dorn LABOR, MD REFERRING PROVIDER: Charlott Dorn LABOR, MD  END OF SESSION:  PT End of Session - 06/17/24 1536     Visit Number 4    Number of Visits 13    Date for Recertification  07/23/24   Due to delay in scheduling   Authorization Type BCBS Medicare    PT Start Time 1535    PT Stop Time 1618    PT Time Calculation (min) 43 min    Activity Tolerance Patient tolerated treatment well    Behavior During Therapy [facility] for tasks assessed/performed          Past Medical History:  Diagnosis Date   Acute meniscal tear of knee    Anxiety    Asthma    CAD (coronary artery disease)    Cancer (HCC)    Cataract    Environmental allergies    GERD (gastroesophageal reflux disease)    History of basal cell carcinoma (BCC) excision    History of detached retina repair    left s/p laser 2019;  right s/p surgical repair 02/ 2015   History of malignant melanoma of skin    04-26-2019 per pt fall 2019 excision from back, localized, and no recurrence   History of prostate cancer urologist --- dr renda   dx 2016-- Stage T1c, Gleason 3+3;  05-11-2015 s/p  radical prostatectomy  (04-26-2019 per pt psa undetectable)   Hx of adenomatous colonic polyps 01/18/2017   Hyperlipidemia    Low platelet count    bruising   Mild obstructive sleep apnea    no longer has to use CPAP wt. loss (pt retested 10-30-2014 epic)   OA (osteoarthritis)    Sleep apnea    oral appliance    Thrombocytopenia, unspecified    04-26-2019  per pt baseline plt count 150   Type 2 diabetes mellitus (HCC)    followed by pcp  (04-26-2019 check's cbg daily,  fasting cbg-- 110-120;  per pt last A1c 5.8)   Wears glasses    Past Surgical History:  Procedure Laterality Date   CATARACT EXTRACTION W/ INTRAOCULAR LENS  IMPLANT, BILATERAL  01/2018    COLONOSCOPY  last one 01-12-2017   GAS INSERTION Right 08/19/2013   Procedure: INSERTION OF GAS;  Surgeon: Arley LABOR Ruder, MD;  Location: [facility] OR;  Service: Ophthalmology;  Laterality: Right;  SF6   KNEE ARTHROSCOPY Left 05/02/2019   Procedure: ARTHROSCOPY KNEE evaluation under anesthesia debridement partial medial meniscectomy chondroplasty;  Surgeon: Gerome Charleston, MD;  Location: [facility];  Service: Orthopedics;  Laterality: Left;  Anesthesia - Femoral nerve block/knee block/IV sedation   LEFT HEART CATH AND CORONARY ANGIOGRAPHY N/A 08/15/2023   Procedure: LEFT HEART CATH AND CORONARY ANGIOGRAPHY;  Surgeon: [name], MD;  Location: [facility] INVASIVE CV LAB;  Service: Cardiovascular;  Laterality: N/A;   MASS EXCISION Left 07/08/2014   Procedure: EXCISION OF KERATOACANTHOMA LEFT LOWER LEG, AND EXCISION OF BASIL CELL CARCINOMA FROM NECK;  Surgeon: [name], MD;  Location: [facility];  Service: General;  Laterality: Left;  left lower leg and posterior neck   NASAL SEPTOPLASTY W/ TURBINOPLASTY  12-13-2001  dr byers @[facility]    PROSTATE BIOPSY  11/11/2014   REPLACEMENT TOTAL KNEE     ROBOT ASSISTED LAPAROSCOPIC RADICAL PROSTATECTOMY N/A 05/11/2015   Procedure: ROBOTIC ASSISTED LAPAROSCOPIC RADICAL PROSTATECTOMY  LEVEL 1;  Surgeon: [name], MD;  Location: [facility] ORS;  Service: Urology;  Laterality: N/A;   SCLERAL BUCKLE WITH CRYO Right 08/19/2013   Procedure: SCLERAL BUCKLE WITH CRYOPEXY;  Surgeon: [name], MD;  Location: [facility] OR;  Service: Ophthalmology;  Laterality: Right;   TONSILLECTOMY AND ADENOIDECTOMY  chiuld   Patient Active Problem List   Diagnosis Date Noted   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   Dry mouth 11/26/2019   Nasal valve collapse 11/26/2019   Sensorineural hearing loss (SNHL) of left ear 11/26/2019   Acquired trigger finger of right middle finger 05/27/2019   Pain in finger of right hand 05/27/2019   Pain in left knee 04/08/2019   Hx of  adenomatous colonic polyps 01/18/2017   Prostate cancer (HCC) 05/11/2015   Malignant neoplasm of prostate (HCC) 12/18/2014   Cough 09/10/2014   Keratoacanthoma of lower leg, 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   Basal cell carcinoma of neck 04/24/2014   Haglund's deformity of right heel 01/16/2014   FOOT PAIN, BILATERAL 04/12/2010   NEOPLASM OF UNCERTAIN BEHAVIOR OF SKIN 10/28/2009   HYPOGONADISM 10/28/2009   Infective otitis externa 10/28/2009   CERUMEN IMPACTION 10/28/2009   ACHILLES TENDINITIS 02/11/2009   PLANTAR FASCIITIS, RIGHT 02/11/2009   Other specified abnormal findings of blood chemistry 08/15/2008   ABNORMAL LIVER FUNCTION TESTS 08/15/2008   OBSTRUCTIVE SLEEP APNEA 10/30/2007   DIABETES MELLITUS, TYPE II 02/03/2007   DYSLIPIDEMIA 02/03/2007   DISORDERS, 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   Asthma 02/03/2007   MICROALBUMINURIA 02/03/2007    ONSET DATE: 05/06/2024 (referral)   REFERRING DIAG:  R26.89 (ICD-10-CM) - Other abnormalities of gait and mobility    THERAPY DIAG:  Unsteadiness on feet  Muscle weakness (generalized)  Other abnormalities of gait and mobility  Rationale for Evaluation and Treatment: Rehabilitation  SUBJECTIVE:                                                                                                                                                                                             SUBJECTIVE STATEMENT: Pt reports doing well. No falls. Eccentric heel taps from his HEP was irritating his knee so he has stopped them per therapist recommendation.   Pt accompanied by: self  PERTINENT HISTORY: Adjustment disorder depressed type, OCPD, GAD, anxiety, CAD, GERD  From Dr. 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 His neurological examination is grossly nonfocal. He had some mild apraxia with demonstrating how to ride a bicycle, though no other overt ideomotor apraxia with things like demonstrating how to use tools or other in transit and gestures. The only other gesture that he stumbled with was showing the [facility] greeting from [employer], which he said he has always had a problem demonstrating. His cognitive testing was within cognitively normal range, losing points for missing 2/4 calculations, having an impaired figure cube drawing, and missing 1/4 of the recall items. We were all surprised that he struggled with missing half of the calculations, since math has historically been a strong suit of his. His cognitive screen is within that of a cognitively normal range, though given the changes that they describe, he meets criteria for a mild cognitive impairment. He has been diagnosed with medical comorbidities that could be contributing to impaired attention and focus, such as attention deficit hyperactivity disorder and obstructive sleep apnea. He is currently not being treated for ADHD and had an overstimulating response when put on Ritalin  previously. He has an oral appliance for OSA, since his OSA is not bad enough to necessitate using CPAP, so this is being addressed. There is still concern that he could have a early neurodegenerative dementing process, though his FDG PET scan performed in 2021 that was read as normal would argue against that. He has treatment-behavior, which could be consistent with REM disorder that can be an early sign of cynically neuropathy, something like dementia with Lewy bodies. Because of the described apraxia and difficulty with balance and coordination, he could have something like corticobasal  syndrome, though he is not demonstrating alien limb phenomenon or parkinsonism that might otherwise be seen with this disorder. I am less worried about something like Alzheimer disease.  I recommended the following work-up, including imaging of the brain and comprehensive neuropsychological evaluation. I also recommended he be re-evaluated by sleep medicine, specifically if this is pseudo-REM disorder secondary to obstructive sleep apnea (OSA) versus REM disorder. I prescribed atomoxetine  (Strattera ) with titration up to 40 mg twice daily to address ADHD. I also recommended he seek out physical therapy locally to assist with things like balance and gait. I encourage cognitive and physical exercise, social engagement, and the Mediterranean/MIND diets for cognitive health.   From   PAIN:  Are you having pain? No  PRECAUTIONS: Fall  RED FLAGS: None   WEIGHT BEARING RESTRICTIONS: No  FALLS: Has patient fallen in last 6 months? Yes. Number of falls 1  LIVING ENVIRONMENT: Lives with: lives with their spouse Lives in: House/apartment Stairs: Yes: External: 6 in front, 3 in utility room and 2 in garage  steps; bilateral but cannot reach both Has following equipment at home: None  PLOF: Independent  PATIENT GOALS: I wanna feel safer walking. I want to find out what are my true limitations   OBJECTIVE:  Note: Objective measures were completed at Evaluation unless otherwise noted.  DIAGNOSTIC FINDINGS: MRI of brain completed on 05/13/24, not interpreted at time of eval   COGNITION: Overall cognitive status: Within functional limits for tasks assessed and noted difficulty w/alternating attention. Pt reports anomia and distractibility as well    SENSATION: Pt denies numbness/tingling in BUEs   COORDINATION: Impaired fine motor coordination of RUE   MUSCLE TONE: RLE: Muscle atrophy noted of R quad    POSTURE: No Significant postural limitations  LOWER EXTREMITY  ROM:     Active   Right Eval Left Eval  Hip flexion    Hip extension    Hip abduction    Hip adduction    Hip internal rotation    Hip external rotation    Knee flexion    Knee extension    Ankle dorsiflexion    Ankle plantarflexion    Ankle inversion    Ankle eversion     (Blank rows = not tested)  LOWER EXTREMITY MMT:  Tested in seated position   MMT Right Eval Left Eval  Hip flexion 4- 4  Hip extension    Hip abduction 4   Hip adduction    Hip internal rotation    Hip external rotation    Knee flexion    Knee extension    Ankle dorsiflexion    Ankle plantarflexion    Ankle inversion    Ankle eversion    (Blank rows = not tested)  BED MOBILITY:  Not tested Pt reports he has to be careful due to size and height on bed.   TRANSFERS: Sit to stand: Modified independence  Assistive device utilized: None     Stand to sit: Modified independence  Assistive device utilized: None      RAMP:  Not tested  CURB:  Not tested  STAIRS: Not tested GAIT: Gait pattern: WFL Distance walked: Various clinic distances  Assistive device utilized: None Level of assistance: Modified independence Comments: Pt demonstrates inferior tilt of R pelvis and L lateral lean compensation.    FUNCTIONAL TESTS:  MCTSIB, 5x STS and FGA to be assessed                                                                                                                               TREATMENT:   Ther Act  Elliptical level 1 for 8 min (4 fwd and 4 retro) for cardiovascular warmup, posterior chain strength and assessment of pelvic mobility. Increased difficulty in retro direction due to fatigue. Noted decreased pelvic rotation on L side vs R as well as increased lateral lean to L  For improved posterior chain strength, single leg stability and pelvic stability: Supine glute bridge w/HS curl w/feet on large blue theraball, x14 reps. Min A initially to stabilize ball due to drift to L side, which pt able to  correct w/repetition and cues. Min cues to full extend hips prior to extending knees. Added to HEP (see bolded below)  Single leg bridge on large blue theraball, x10 reps per side. Increased difficulty on LLE, but pt able to maintain balance without assistance from therapist. Added to HEP (see bolded below)  Modified side plank on forearm w/clamshell, x12 reps per side. Mod multimodal cues for proper pelvic positioning and to reduce pelvic rotation w/movement. Added to HEP (see bolded below)  6 Blaze pods on random reach setting for improved reactive stepping, LE coordination and single leg stability.  Performed on 2.5  minute intervals with SBA-CGA guarding. Round 1:  anchored black resistance band on // bar and placed around pt's pelvis to provide posterolateral perturbations. Placed pods from 9-3 o'clock around pt.  54 hits. Notable errors/deficits:  increased instability when stepping to L side. Occasional scissoring noted but pt able to stabilize independently. Pt w/increased coordination moving quickly compared to slowly.    PATIENT EDUCATION: Education details: Updates to HEP, perform bird dogs at home/[place] without resistance  Person educated: Patient Education method: Explanation, Demonstration, Tactile cues, Verbal cues, and Handouts Education comprehension: verbalized understanding, returned demonstration, verbal cues required, and needs further education  HOME EXERCISE PROGRAM: Access Code: [email or web address] URL: [email or web address] Date: 06/13/2024 Prepared by: Marlon Pailyn [place]  Exercises - Bird Dog with Resistance  - 1 x daily - 7 x weekly - 3 sets - 6-8 reps - 2 second hold - Spanish Squat With Resistance  - 1 x daily - 7 x weekly - 3 sets - 10-12 reps - 2 seconds hold - Forward Step Down  - 1 x daily - 7 x weekly - 3 sets - 10-15 reps - Bridge with Hamstring Curl on Swiss Ball  - 1 x daily - 7 x weekly - 3 sets - 10 reps - Single Leg Bridge on [employer]  - 1 x daily -  7 x weekly - 3 sets - 10 reps - Side Plank with Clam  - 1 x daily - 7 x weekly - 3 sets - 10 reps  GOALS: Goals reviewed with patient? Yes  SHORT TERM GOALS: Target date: 06/11/2024   Pt will be independent with initial HEP for improved strength, balance, transfers and gait.  Baseline: initiated on 06/13/24 Goal status: IN PROGRESS  2.  MCTSIB to be assessed and STG/LTG updated  Baseline: 120/120 Goal status: DC DUE TO HIGH BASELINE SCORE   3.  5x STS to be assessed and LTG updated  Baseline:  Goal status: MET  4.  Minibest to be assessed and STG/LTG updated  Baseline:  Goal status: DC    LONG TERM GOALS: Target date: 07/09/2024   Pt will be independent with final HEP for improved strength, balance, transfers and gait.  Baseline:  Goal status: INITIAL  2.  MCTSIB goal  Baseline:  Goal status: DC DUE TO HIGH BASELINE SCORE   3.  MiniBest goal Baseline:  Goal status: DC  4. Pt will improve 5 x STS to less than or equal to 10 seconds w/equal foot placement and no retropulsion to demonstrate improved functional strength and transfer efficiency.   Baseline: 11.62s w/RLE posterior and minor retropulsion  Goal status: INITIAL   ASSESSMENT:  CLINICAL IMPRESSION: Emphasis of skilled PT session on single leg stability, posterior chain strength, pelvic stability and stepping strategy. Pt w/increased knee pain during eccentric heel taps, so advised against performing at home at this time. Pt continues to demonstrate decreased stability on LLE, relying on lateral lean to stabilize in single leg stance. Added posterior chain strength and single leg stability tasks to HEP for improved stability and knee support. Pt demonstrates improved stability when moving quickly, so will continue to address stability w/reduced speed for reduced fall risk. Discontinued MiniBest goal at this time as it is not applicable to pt's performance. Continue POC.   OBJECTIVE IMPAIRMENTS: decreased  activity tolerance, decreased balance, decreased cognition, decreased coordination, decreased knowledge of condition, decreased knowledge of use of DME, difficulty walking, decreased strength, impaired perceived functional ability, impaired tone, and improper body mechanics  ACTIVITY  LIMITATIONS: stairs, transfers, bed mobility, locomotion level, and caring for others  PARTICIPATION LIMITATIONS: interpersonal relationship, driving, shopping, community activity, occupation, and yard work  PERSONAL FACTORS: Behavior pattern and 1-2 comorbidities: GAD, OCPD and Adjustment disorder are also affecting patient's functional outcome.   REHAB POTENTIAL: Good  CLINICAL DECISION MAKING: Evolving/moderate complexity  EVALUATION COMPLEXITY: Moderate  PLAN:  PT FREQUENCY: 2x/week  PT DURATION: 6 weeks (POC written for 10 weeks due to delay in scheduling over holidays)  PLANNED INTERVENTIONS: 02835- PT Re-evaluation, 97750- Physical Performance Testing, 97110-Therapeutic exercises, 97530- Therapeutic activity, [id number]- Neuromuscular re-education, 97535- Self Care, 02859- Manual therapy, [id number]- Gait training, [id number]- Orthotic Initial, [phone]- Canalith repositioning, [id number]- Aquatic Therapy, [id number]- Electrical stimulation (manual), [phone] (1-2 muscles), 20561 (3+ muscles)- Dry Needling, Patient/Family education, Balance training, Stair training, Joint mobilization, Spinal mobilization, Vestibular training, and DME instructions  PLAN FOR NEXT SESSION:  Monitor vitals. Gait on TM. Work on eccentric control of quads - heel taps, single leg RDLs, single leg bridges, bird dogs, multifidi rotations, squats, rockerboard, resisted blaze pods, half kneel    Deshawnda Acrey E Desmon Hitchner, PT, DPT 06/17/2024, 4:21 PM        "

## 2024-06-22 ENCOUNTER — Other Ambulatory Visit (HOSPITAL_BASED_OUTPATIENT_CLINIC_OR_DEPARTMENT_OTHER): Payer: Self-pay

## 2024-06-24 ENCOUNTER — Other Ambulatory Visit (HOSPITAL_BASED_OUTPATIENT_CLINIC_OR_DEPARTMENT_OTHER): Payer: Self-pay

## 2024-06-24 MED ORDER — ONETOUCH VERIO VI STRP
ORAL_STRIP | 3 refills | Status: AC
  Filled 2024-06-24: qty 300, 90d supply, fill #0

## 2024-06-25 ENCOUNTER — Ambulatory Visit: Payer: Self-pay | Admitting: Pulmonary Disease

## 2024-06-25 DIAGNOSIS — G4752 REM sleep behavior disorder: Secondary | ICD-10-CM

## 2024-06-25 NOTE — Procedures (Signed)
 [facility] [address] [place], [place] [address] Tel: [phone]   Fax: [phone]  Polysomnography Interpretation  Patient Name:  ISEAH, PLOUFF Date:  06/16/2024 Referring Physician:  HARDEN Cyruss Arata [phone]) %%startinterp%% Indications for Polysomnography The patient is a 68 year-old Male who is 6' 1 and weighs 176.0 lbs. His BMI equals 23.3.  A full night polysomnogram was performed to evaluate for snoring, acting out dreams.  Polysomnogram Data A full night polysomnogram recorded the standard physiologic parameters including EEG, EOG, EMG, EKG, nasal and oral airflow.  Respiratory parameters of chest and abdominal movements were recorded with Respiratory Inductance Plethysmography belts.  Oxygen saturation was recorded by pulse oximetry.   Sleep Architecture The total recording time of the polysomnogram was 429.3 minutes.  The total sleep time was 360.0 minutes.  The patient spent 11.5% of total sleep time in Stage N1, 61.5% in Stage N2, 14.3% in Stages N3, and 12.6% in REM.  Sleep latency was 6.0 minutes.  REM latency was 132.0 minutes.  Sleep Efficiency was 83.9%.  Wake after Sleep Onset time was 63.5 minutes.  Respiratory Events The polysomnogram revealed a presence of - obstructive, 1 central, and - mixed apneas resulting in an Apnea index of 0.2 events per hour.  There were 56 hypopneas (>=3% desaturation and/or arousal) resulting in an Apnea\Hypopnea Index (AHI >=3% desaturation and/or arousal) of 9.5 events per hour.  There were 26 hypopneas (>=4% desaturation) resulting in an Apnea\Hypopnea Index (AHI >=4% desaturation) of 4.5 events per hour.  There were 1 Respiratory Effort Related Arousals resulting in a RERA index of 0.2 events per hour. The Respiratory Disturbance Index is 9.7 events per hour.  The snore index was 14.7 events per hour.  Mean oxygen saturation was 91.8%.  The lowest oxygen saturation during sleep was 85.0%.  Time  spent <=88% oxygen saturation was 1.6 minutes (0.4%).  Limb Activity There were 233 total limb movements recorded, of this total, 222 were classified as PLMs.  PLM index was 37.0 per hour and PLM associated with Arousals index was 1.7 per hour.  Cardiac Summary The average pulse rate was 60.7 bpm.  The minimum pulse rate was 57.0 bpm while the maximum pulse rate was 105.0 bpm.  Cardiac rhythm was normal/abnormal.  Comments: . For the RBD study, L and R arm leads were applied and added to the montage. The patient wore his oral appliance for the study.   Snore was soft and intermittent with occasional moderate snorts. Snore and PLMAs were occasional. Movement was observed in REM sleep. Most notable episode was at epoch 710. The patient was observed to move his left arm as if he were emphasizing a phrase. His mouth was moving but no sound was audible. Other areas were scratching or touching the side of his face. Mainly the left side.    Diagnosis: No evidence of significant sleep disordered breathing on this study using dental appliance Significant PLMs were noted Movement during REM sleep is suggestive of REM behavior disorder  Recommendations: Assess for symptoms of RBD. Consider treatment with low dose benzodiazepine if very symptomatic Co-relate with clinical history of restless legs syndrome Continue use of dental appliance during sleep   This study was personally reviewed and electronically signed by: Harden Staff, MD Accredited Board Certified in Sleep Medicine  06/25/24

## 2024-06-26 ENCOUNTER — Ambulatory Visit: Admitting: Physical Therapy

## 2024-06-26 DIAGNOSIS — M6281 Muscle weakness (generalized): Secondary | ICD-10-CM

## 2024-06-26 DIAGNOSIS — R2689 Other abnormalities of gait and mobility: Secondary | ICD-10-CM

## 2024-06-26 DIAGNOSIS — R2681 Unsteadiness on feet: Secondary | ICD-10-CM | POA: Diagnosis not present

## 2024-06-26 NOTE — Therapy (Signed)
 " OUTPATIENT PHYSICAL THERAPY NEURO TREATMENT   Patient Name: Timothy [name] MRN: [id number] DOB:1956-01-12, 68 y.o., male Today's Date: 06/26/2024   PCP: Charlott Dorn LABOR, MD REFERRING PROVIDER: Charlott Dorn LABOR, MD  END OF SESSION:  PT End of Session - 06/26/24 1532     Visit Number 5    Number of Visits 13    Date for Recertification  07/23/24   Due to delay in scheduling   Authorization Type BCBS Medicare    PT Start Time 1531    PT Stop Time 1613    PT Time Calculation (min) 42 min    Activity Tolerance Patient tolerated treatment well    Behavior During Therapy [facility] for tasks assessed/performed           Past Medical History:  Diagnosis Date   Acute meniscal tear of knee    Anxiety    Asthma    CAD (coronary artery disease)    Cancer (HCC)    Cataract    Environmental allergies    GERD (gastroesophageal reflux disease)    History of basal cell carcinoma (BCC) excision    History of detached retina repair    left s/p laser 2019;  right s/p surgical repair 02/ 2015   History of malignant melanoma of skin    04-26-2019 per pt fall 2019 excision from back, localized, and no recurrence   History of prostate cancer urologist --- dr Timothy [name]   dx 2016-- Stage T1c, Gleason 3+3;  05-11-2015 s/p  radical prostatectomy  (04-26-2019 per pt psa undetectable)   Hx of adenomatous colonic polyps 01/18/2017   Hyperlipidemia    Low platelet count    bruising   Mild obstructive sleep apnea    no longer has to use CPAP wt. loss (pt retested 10-30-2014 epic)   OA (osteoarthritis)    Sleep apnea    oral appliance    Thrombocytopenia, unspecified    04-26-2019  per pt baseline plt count 150   Type 2 diabetes mellitus (HCC)    followed by pcp  (04-26-2019 check's cbg daily,  fasting cbg-- 110-120;  per pt last A1c 5.8)   Wears glasses    Past Surgical History:  Procedure Laterality Date   CATARACT EXTRACTION W/ INTRAOCULAR LENS  IMPLANT, BILATERAL  01/2018    COLONOSCOPY  last one 01-12-2017   GAS INSERTION Right 08/19/2013   Procedure: INSERTION OF GAS;  Surgeon: Arley LABOR Ruder, MD;  Location: [facility] OR;  Service: Ophthalmology;  Laterality: Right;  SF6   KNEE ARTHROSCOPY Left 05/02/2019   Procedure: ARTHROSCOPY KNEE evaluation under anesthesia debridement partial medial meniscectomy chondroplasty;  Surgeon: Gerome Charleston, MD;  Location: [facility];  Service: Orthopedics;  Laterality: Left;  Anesthesia - Femoral nerve block/knee block/IV sedation   LEFT HEART CATH AND CORONARY ANGIOGRAPHY N/A 08/15/2023   Procedure: LEFT HEART CATH AND CORONARY ANGIOGRAPHY;  Surgeon: [name], MD;  Location: [facility] INVASIVE CV LAB;  Service: Cardiovascular;  Laterality: N/A;   MASS EXCISION Left 07/08/2014   Procedure: EXCISION OF KERATOACANTHOMA LEFT LOWER LEG, AND EXCISION OF BASIL CELL CARCINOMA FROM NECK;  Surgeon: [name], MD;  Location: [facility];  Service: General;  Laterality: Left;  left lower leg and posterior neck   NASAL SEPTOPLASTY W/ TURBINOPLASTY  12-13-2001  dr byers @[facility]    PROSTATE BIOPSY  11/11/2014   REPLACEMENT TOTAL KNEE     ROBOT ASSISTED LAPAROSCOPIC RADICAL PROSTATECTOMY N/A 05/11/2015   Procedure: ROBOTIC ASSISTED LAPAROSCOPIC RADICAL  PROSTATECTOMY LEVEL 1;  Surgeon: [name], MD;  Location: [facility] ORS;  Service: Urology;  Laterality: N/A;   SCLERAL BUCKLE WITH CRYO Right 08/19/2013   Procedure: SCLERAL BUCKLE WITH CRYOPEXY;  Surgeon: [name], MD;  Location: [facility] OR;  Service: Ophthalmology;  Laterality: Right;   TONSILLECTOMY AND ADENOIDECTOMY  Timothy [name]   Patient Active Problem List   Diagnosis Date Noted   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   Dry mouth 11/26/2019   Nasal valve collapse 11/26/2019   Sensorineural hearing loss (SNHL) of left ear 11/26/2019   Acquired trigger finger of right middle finger 05/27/2019   Pain in finger of right hand 05/27/2019   Pain in left knee 04/08/2019   Hx of  adenomatous colonic polyps 01/18/2017   Prostate cancer (HCC) 05/11/2015   Malignant neoplasm of prostate (HCC) 12/18/2014   Cough 09/10/2014   Keratoacanthoma of lower leg, 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   Basal cell carcinoma of neck 04/24/2014   Haglund's deformity of right heel 01/16/2014   FOOT PAIN, BILATERAL 04/12/2010   NEOPLASM OF UNCERTAIN BEHAVIOR OF SKIN 10/28/2009   HYPOGONADISM 10/28/2009   Infective otitis externa 10/28/2009   CERUMEN IMPACTION 10/28/2009   ACHILLES TENDINITIS 02/11/2009   PLANTAR FASCIITIS, RIGHT 02/11/2009   Other specified abnormal findings of blood chemistry 08/15/2008   ABNORMAL LIVER FUNCTION TESTS 08/15/2008   OBSTRUCTIVE SLEEP APNEA 10/30/2007   DIABETES MELLITUS, TYPE II 02/03/2007   DYSLIPIDEMIA 02/03/2007   DISORDERS, 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   Asthma 02/03/2007   MICROALBUMINURIA 02/03/2007    ONSET DATE: 05/06/2024 (referral)   REFERRING DIAG:  R26.89 (ICD-10-CM) - Other abnormalities of gait and mobility    THERAPY DIAG:  Unsteadiness on feet  Muscle weakness (generalized)  Other abnormalities of gait and mobility  Rationale for Evaluation and Treatment: Rehabilitation  SUBJECTIVE:                                                                                                                                                                                             SUBJECTIVE STATEMENT: Pt reports being down today, received a phone call w/results of his sleep study that was not ideal. Went to [place] a few times since last session and worked on his exercises, but did not have a lot of time due to the holidays. R knee is still bothersome, hurts the most when going up steps. No fall.   Pt accompanied by: self  PERTINENT HISTORY: Adjustment disorder depressed type, OCPD, GAD, anxiety, CAD, GERD  From Dr. 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 His neurological examination is grossly nonfocal. He had some mild apraxia with demonstrating how to ride a bicycle, though no other overt ideomotor apraxia with things like demonstrating how to use tools or other in transit and gestures. The only other gesture that he stumbled with was showing the [facility] greeting from [employer], which he said he has always had a problem demonstrating. His cognitive testing was within cognitively normal range, losing points for missing 2/4 calculations, having an impaired figure cube drawing, and missing 1/4 of the recall items. We were all surprised that he struggled with missing half of the calculations, since math has historically been a strong suit of his. His cognitive screen is within that of a cognitively normal range, though given the changes that they describe, he meets criteria for a mild cognitive impairment. He has been diagnosed with medical comorbidities that could be contributing to impaired attention and focus, such as attention deficit hyperactivity disorder and obstructive sleep apnea. He is currently not being treated for ADHD and had an overstimulating response when put on Ritalin  previously. He has an oral appliance for OSA, since his OSA is not bad enough to necessitate using CPAP, so this is being addressed. There is still concern that he could have a early neurodegenerative dementing process, though his FDG PET scan performed in 2021 that was read as normal would argue against that. He has treatment-behavior, which could be consistent with REM disorder that can be an early sign of cynically neuropathy, something  like dementia with Lewy bodies. 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 I am less worried about something like Alzheimer disease.  I recommended the following work-up, including imaging of the brain and comprehensive neuropsychological evaluation. I also recommended he be re-evaluated by sleep medicine, specifically if this is pseudo-REM disorder secondary to obstructive sleep apnea (OSA) versus REM disorder. I prescribed atomoxetine  (Strattera ) with titration up to 40 mg twice daily to address ADHD. I also recommended he seek out physical therapy locally to assist with things like balance and gait. I encourage cognitive and physical exercise, social engagement, and the Mediterranean/MIND diets for cognitive health.   From   PAIN:  Are you having pain? No  PRECAUTIONS: Fall  RED FLAGS: None   WEIGHT BEARING RESTRICTIONS: No  FALLS: Has patient fallen in last 6 months? Yes. Number of falls 1  LIVING ENVIRONMENT: Lives with: lives with their spouse Lives in: House/apartment Stairs: Yes: External: 6 in front, 3 in utility room and 2 in garage  steps; bilateral but cannot reach both Has following equipment at home: None  PLOF: Independent  PATIENT GOALS: I wanna feel safer walking. I want to find out what are my true limitations   OBJECTIVE:  Note: Objective measures were completed at Evaluation unless otherwise noted.  DIAGNOSTIC FINDINGS: MRI of brain completed on 05/13/24, not interpreted at time of eval   COGNITION: Overall cognitive status: Within functional limits for tasks assessed and noted difficulty w/alternating attention. Pt reports anomia and distractibility as well    SENSATION: Pt  denies numbness/tingling in BUEs   COORDINATION: Impaired fine motor  coordination of RUE   MUSCLE TONE: RLE: Muscle atrophy noted of R quad    POSTURE: No Significant postural limitations  LOWER EXTREMITY ROM:     Active  Right Eval Left Eval  Hip flexion    Hip extension    Hip abduction    Hip adduction    Hip internal rotation    Hip external rotation    Knee flexion    Knee extension    Ankle dorsiflexion    Ankle plantarflexion    Ankle inversion    Ankle eversion     (Blank rows = not tested)  LOWER EXTREMITY MMT:  Tested in seated position   MMT Right Eval Left Eval  Hip flexion 4- 4  Hip extension    Hip abduction 4   Hip adduction    Hip internal rotation    Hip external rotation    Knee flexion    Knee extension    Ankle dorsiflexion    Ankle plantarflexion    Ankle inversion    Ankle eversion    (Blank rows = not tested)  BED MOBILITY:  Not tested Pt reports he has to be careful due to size and height on bed.   TRANSFERS: Sit to stand: Modified independence  Assistive device utilized: None     Stand to sit: Modified independence  Assistive device utilized: None      RAMP:  Not tested  CURB:  Not tested  STAIRS: Not tested GAIT: Gait pattern: WFL Distance walked: Various clinic distances  Assistive device utilized: None Level of assistance: Modified independence Comments: Pt demonstrates inferior tilt of R pelvis and L lateral lean compensation.    FUNCTIONAL TESTS:  MCTSIB, 5x STS and FGA to be assessed                                                                                                                               TREATMENT:   Ther Act/NMR  The following treadmill training was completed for aerobic/neural priming, single leg stability, symmetry of step length and gait speed.  - Warmup: 3:00 up to 3.0 mph - HIIT: 6:00 30sec ON/OFF alternating large blue theraball kicks and normal gait at 1.7-2.0 mph. Noted decreased SLS time on LLE > RLE - Cool down: 1:30 at 3.0 mph  W/foot propped  on wall, performed single leg RDL w/rotation using 15# KB, x8 reps on RLE and x3 reps on LLE for improved single leg stability and glute med strength. Pt unable to sabilize well w/leg on wall, so regressed to B-stance RDLs, x10 reps on L side. Added to HEP (see bolded below)  6 Blaze pods on random reach setting for improved cog-motor dual-tasking and proximal stability.  Performed on 2 minute intervals with 2 minute rest periods.  Pt requires CGA-min A guarding. Round 1:  placed pt in tall kneel on bosu (blue  side) on mat table and arranged pods from 9-3 o'clock around him on mat.  60 hits. Round 2:  same setup but added cog task (naming foods if pod is red and animals if blue).  33 hits. Notable errors/deficits:  Cued pt to slow down and focus on stability rather than # of pods. Increased difficulty w/addition of dual-task due to frustration and rushing.   PATIENT EDUCATION: Education details: Updates to HEP Person educated: Patient Education method: Explanation, Demonstration, Actor cues, and Verbal cues Education comprehension: verbalized understanding, returned demonstration, verbal cues required, and needs further education  HOME EXERCISE PROGRAM: Access Code: [email or web address] URL: [email or web address] Date: 06/13/2024 Prepared by: Marlon Samantha Olivera  Exercises - Bird Dog with Resistance  - 1 x daily - 7 x weekly - 3 sets - 6-8 reps - 2 second hold - Spanish Squat With Resistance  - 1 x daily - 7 x weekly - 3 sets - 10-12 reps - 2 seconds hold - Forward Step Down  - 1 x daily - 7 x weekly - 3 sets - 10-15 reps - Bridge with Hamstring Curl on Swiss Ball  - 1 x daily - 7 x weekly - 3 sets - 10 reps - Single Leg Bridge on [employer]  - 1 x daily - 7 x weekly - 3 sets - 10 reps - Side Plank with Clam  - 1 x daily - 7 x weekly - 3 sets - 10 reps - Single leg deadlift   - 1 x daily - 7 x weekly - 3 sets - 10 reps  GOALS: Goals reviewed with patient? Yes  SHORT TERM GOALS: Target  date: 06/11/2024   Pt will be independent with initial HEP for improved strength, balance, transfers and gait.  Baseline: initiated on 06/13/24 Goal status: IN PROGRESS  2.  MCTSIB to be assessed and STG/LTG updated  Baseline: 120/120 Goal status: DC DUE TO HIGH BASELINE SCORE   3.  5x STS to be assessed and LTG updated  Baseline:  Goal status: MET  4.  Minibest to be assessed and STG/LTG updated  Baseline:  Goal status: DC    LONG TERM GOALS: Target date: 07/09/2024   Pt will be independent with final HEP for improved strength, balance, transfers and gait.  Baseline:  Goal status: INITIAL  2.  MCTSIB goal  Baseline:  Goal status: DC DUE TO HIGH BASELINE SCORE   3.  MiniBest goal Baseline:  Goal status: DC  4. Pt will improve 5 x STS to less than or equal to 10 seconds w/equal foot placement and no retropulsion to demonstrate improved functional strength and transfer efficiency.   Baseline: 11.62s w/RLE posterior and minor retropulsion  Goal status: INITIAL   ASSESSMENT:  CLINICAL IMPRESSION: Emphasis of skilled PT session on single leg stability, posterior chain strength, proximal stability and cog dual-tasking. 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 Pt continues to be limited by LLE weakness and noted decreased stance time on LLE during TM activity. Pt also reporting increased R knee pain w/hip/knee flexion. Continue to suspect leg length discrepancy contributing to knee pain and LLE instability. Pt  challenged by cog dual task today but has tendency to rush through activity, so required cues to slow down and focus on task. Will continue to address this in PT. Continue POC.   OBJECTIVE IMPAIRMENTS: decreased activity tolerance, decreased balance, decreased cognition, decreased coordination, decreased knowledge of condition, decreased knowledge of use of DME, difficulty walking, decreased  strength, impaired perceived  functional ability, impaired tone, and improper body mechanics  ACTIVITY LIMITATIONS: stairs, transfers, bed mobility, locomotion level, and caring for others  PARTICIPATION LIMITATIONS: interpersonal relationship, driving, shopping, community activity, occupation, and yard work  PERSONAL FACTORS: Behavior pattern and 1-2 comorbidities: GAD, OCPD and Adjustment disorder are also affecting patient's functional outcome.   REHAB POTENTIAL: Good  CLINICAL DECISION MAKING: Evolving/moderate complexity  EVALUATION COMPLEXITY: Moderate  PLAN:  PT FREQUENCY: 2x/week  PT DURATION: 6 weeks (POC written for 10 weeks due to delay in scheduling over holidays)  PLANNED INTERVENTIONS: 02835- PT Re-evaluation, 97750- Physical Performance Testing, 97110-Therapeutic exercises, 97530- Therapeutic activity, [id number]- Neuromuscular re-education, 97535- Self Care, 02859- Manual therapy, [id number]- Gait training, [phone]- Orthotic Initial, [phone]- Canalith repositioning, [id number]- Aquatic Therapy, [phone]- Electrical stimulation (manual), [phone] (1-2 muscles), [id number] (3+ muscles)- Dry Needling, Patient/Family education, Balance training, Stair training, Joint mobilization, Spinal mobilization, Vestibular training, and DME instructions  PLAN FOR NEXT SESSION:  Monitor vitals. Gait on TM. Work on eccentric control of quads - heel taps, single leg RDLs, single leg bridges, bird dogs, multifidi rotations, squats, rockerboard, resisted blaze pods, half kneel    Saylor Murry E Rosana Farnell, PT, DPT 06/26/2024, 4:14 PM        "

## 2024-07-01 ENCOUNTER — Ambulatory Visit: Attending: Internal Medicine | Admitting: Physical Therapy

## 2024-07-01 DIAGNOSIS — M6281 Muscle weakness (generalized): Secondary | ICD-10-CM | POA: Diagnosis present

## 2024-07-01 DIAGNOSIS — R2689 Other abnormalities of gait and mobility: Secondary | ICD-10-CM | POA: Diagnosis present

## 2024-07-01 DIAGNOSIS — R2681 Unsteadiness on feet: Secondary | ICD-10-CM | POA: Diagnosis present

## 2024-07-01 NOTE — Therapy (Signed)
 " OUTPATIENT PHYSICAL THERAPY NEURO TREATMENT   Patient Name: Timothy [name] MRN: [id number] DOB:06-15-1956, 69 y.o., male Today's Date: 07/01/2024   PCP: Charlott Dorn LABOR, MD REFERRING PROVIDER: Charlott Dorn LABOR, MD  END OF SESSION:  PT End of Session - 07/01/24 1534     Visit Number 6    Number of Visits 13    Date for Recertification  07/23/24   Due to delay in scheduling   Authorization Type BCBS Medicare    PT Start Time 1533    PT Stop Time 1617    PT Time Calculation (min) 44 min    Activity Tolerance Patient tolerated treatment well    Behavior During Therapy [facility] for tasks assessed/performed           Past Medical History:  Diagnosis Date   Acute meniscal tear of knee    Anxiety    Asthma    CAD (coronary artery disease)    Cancer (HCC)    Cataract    Environmental allergies    GERD (gastroesophageal reflux disease)    History of basal cell carcinoma (BCC) excision    History of detached retina repair    left s/p laser 2019;  right s/p surgical repair 02/ 2015   History of malignant melanoma of skin    04-26-2019 per pt fall 2019 excision from back, localized, and no recurrence   History of prostate cancer urologist --- dr renda   dx 2016-- Stage T1c, Gleason 3+3;  05-11-2015 s/p  radical prostatectomy  (04-26-2019 per pt psa undetectable)   Hx of adenomatous colonic polyps 01/18/2017   Hyperlipidemia    Low platelet count    bruising   Mild obstructive sleep apnea    no longer has to use CPAP wt. loss (pt retested 10-30-2014 epic)   OA (osteoarthritis)    Sleep apnea    oral appliance    Thrombocytopenia, unspecified    04-26-2019  per pt baseline plt count 150   Type 2 diabetes mellitus (HCC)    followed by pcp  (04-26-2019 check's cbg daily,  fasting cbg-- 110-120;  per pt last A1c 5.8)   Wears glasses    Past Surgical History:  Procedure Laterality Date   CATARACT EXTRACTION W/ INTRAOCULAR LENS  IMPLANT, BILATERAL  01/2018    COLONOSCOPY  last one 01-12-2017   GAS INSERTION Right 08/19/2013   Procedure: INSERTION OF GAS;  Surgeon: Arley LABOR Ruder, MD;  [facility] OR;  Service: Ophthalmology;  Laterality: Right;  SF6   KNEE ARTHROSCOPY Left 05/02/2019   Procedure: ARTHROSCOPY KNEE evaluation under anesthesia debridement partial medial meniscectomy chondroplasty;  Surgeon: Gerome Charleston, MD;  Location: [facility];  Service: Orthopedics;  Laterality: Left;  Anesthesia - Femoral nerve block/knee block/IV sedation   LEFT HEART CATH AND CORONARY ANGIOGRAPHY N/A 08/15/2023   Procedure: LEFT HEART CATH AND CORONARY ANGIOGRAPHY;  Surgeon: [name], MD;  Location: [facility] INVASIVE CV LAB;  Service: Cardiovascular;  Laterality: N/A;   MASS EXCISION Left 07/08/2014   Procedure: EXCISION OF KERATOACANTHOMA LEFT LOWER LEG, AND EXCISION OF BASIL CELL CARCINOMA FROM NECK;  Surgeon: [name], MD;  Location: [facility];  Service: General;  Laterality: Left;  left lower leg and posterior neck   NASAL SEPTOPLASTY W/ TURBINOPLASTY  12-13-2001  dr byers @[facility]    PROSTATE BIOPSY  11/11/2014   REPLACEMENT TOTAL KNEE     ROBOT ASSISTED LAPAROSCOPIC RADICAL PROSTATECTOMY N/A 05/11/2015   Procedure: ROBOTIC ASSISTED LAPAROSCOPIC RADICAL  PROSTATECTOMY LEVEL 1;  Surgeon: [name], MD;  Location: [facility] ORS;  Service: Urology;  Laterality: N/A;   SCLERAL BUCKLE WITH CRYO Right 08/19/2013   Procedure: SCLERAL BUCKLE WITH CRYOPEXY;  Surgeon: [name], MD;  Location: [facility] OR;  Service: Ophthalmology;  Laterality: Right;   TONSILLECTOMY AND ADENOIDECTOMY  chiuld   Patient Active Problem List   Diagnosis Date Noted   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   Dry mouth 11/26/2019   Nasal valve collapse 11/26/2019   Sensorineural hearing loss (SNHL) of left ear 11/26/2019   Acquired trigger finger of right middle finger 05/27/2019   Pain in finger of right hand 05/27/2019   Pain in left knee 04/08/2019   Hx of  adenomatous colonic polyps 01/18/2017   Prostate cancer (HCC) 05/11/2015   Malignant neoplasm of prostate (HCC) 12/18/2014   Cough 09/10/2014   Keratoacanthoma of lower leg, 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   Basal cell carcinoma of neck 04/24/2014   Haglund's deformity of right heel 01/16/2014   FOOT PAIN, BILATERAL 04/12/2010   NEOPLASM OF UNCERTAIN BEHAVIOR OF SKIN 10/28/2009   HYPOGONADISM 10/28/2009   Infective otitis externa 10/28/2009   CERUMEN IMPACTION 10/28/2009   ACHILLES TENDINITIS 02/11/2009   PLANTAR FASCIITIS, RIGHT 02/11/2009   Other specified abnormal findings of blood chemistry 08/15/2008   ABNORMAL LIVER FUNCTION TESTS 08/15/2008   OBSTRUCTIVE SLEEP APNEA 10/30/2007   DIABETES MELLITUS, TYPE II 02/03/2007   DYSLIPIDEMIA 02/03/2007   DISORDERS, 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   Asthma 02/03/2007   MICROALBUMINURIA 02/03/2007    ONSET DATE: 05/06/2024 (referral)   REFERRING DIAG:  R26.89 (ICD-10-CM) - Other abnormalities of gait and mobility    THERAPY DIAG:  Unsteadiness on feet  Muscle weakness (generalized)  Other abnormalities of gait and mobility  Rationale for Evaluation and Treatment: Rehabilitation  SUBJECTIVE:                                                                                                                                                                                             SUBJECTIVE STATEMENT: Pt reports he has worked out twice since last session. Tried eccentric heel taps again and did not hurt his knee this time, is performing from a shorter box. Had a near miss in his garage the other day, tripped on his LLE while descending stairs and caught himself on his wife's car. Otherwise no changes.   Pt accompanied by: self  PERTINENT HISTORY: Adjustment disorder depressed type, OCPD, GAD, anxiety, CAD, GERD  From Dr. 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 His neurological examination is grossly nonfocal. He had some mild apraxia with demonstrating how to ride a bicycle, though no other overt ideomotor apraxia with things like demonstrating how to use tools or other in transit and gestures. The only other gesture that he stumbled with was showing the [facility] greeting from [employer], which he said he has always had a problem demonstrating. His cognitive testing was within cognitively normal range, losing points for missing 2/4 calculations, having an impaired figure cube drawing, and missing 1/4 of the recall items. We were all surprised that he struggled with missing half of the calculations, since math has historically been a strong suit of his. His cognitive screen is within that of a cognitively normal range, though given the changes that they describe, he meets criteria for a mild cognitive impairment. He has been diagnosed with medical comorbidities that could be contributing to impaired attention and focus, such as attention deficit hyperactivity disorder and obstructive sleep apnea. He is currently not being treated for ADHD and had an overstimulating response when put on Ritalin  previously. He has an oral appliance for OSA, since his OSA is not bad enough to necessitate using CPAP, so this is being addressed. There is still concern that he could have a early neurodegenerative dementing process, though his FDG PET scan performed in 2021 that was read as normal would argue against that. He has treatment-behavior, which could be consistent with REM disorder that can be an early sign of cynically neuropathy,  something like dementia with Lewy bodies. 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 I am less worried about something like Alzheimer disease.  I recommended the following work-up, including imaging of the brain and comprehensive neuropsychological evaluation. I also recommended he be re-evaluated by sleep medicine, specifically if this is pseudo-REM disorder secondary to obstructive sleep apnea (OSA) versus REM disorder. I prescribed atomoxetine  (Strattera ) with titration up to 40 mg twice daily to address ADHD. I also recommended he seek out physical therapy locally to assist with things like balance and gait. I encourage cognitive and physical exercise, social engagement, and the Mediterranean/MIND diets for cognitive health.   From   PAIN:  Are you having pain? No  PRECAUTIONS: Fall  RED FLAGS: None   WEIGHT BEARING RESTRICTIONS: No  FALLS: Has patient fallen in last 6 months? Yes. Number of falls 1  LIVING ENVIRONMENT: Lives with: lives with their spouse Lives in: House/apartment Stairs: Yes: External: 6 in front, 3 in utility room and 2 in garage  steps; bilateral but cannot reach both Has following equipment at home: None  PLOF: Independent  PATIENT GOALS: I wanna feel safer walking. I want to find out what are my true limitations   OBJECTIVE:  Note: Objective measures were completed at Evaluation unless otherwise noted.  DIAGNOSTIC FINDINGS: MRI of brain completed on 05/13/24, not interpreted at time of eval   COGNITION: Overall cognitive status: Within functional limits for tasks assessed and noted difficulty w/alternating attention. Pt reports anomia and distractibility as well    SENSATION: Pt denies numbness/tingling  in BUEs   COORDINATION: Impaired fine  motor coordination of RUE   MUSCLE TONE: RLE: Muscle atrophy noted of R quad    POSTURE: No Significant postural limitations  LOWER EXTREMITY ROM:     Active  Right Eval Left Eval  Hip flexion    Hip extension    Hip abduction    Hip adduction    Hip internal rotation    Hip external rotation    Knee flexion    Knee extension    Ankle dorsiflexion    Ankle plantarflexion    Ankle inversion    Ankle eversion     (Blank rows = not tested)  LOWER EXTREMITY MMT:  Tested in seated position   MMT Right Eval Left Eval  Hip flexion 4- 4  Hip extension    Hip abduction 4   Hip adduction    Hip internal rotation    Hip external rotation    Knee flexion    Knee extension    Ankle dorsiflexion    Ankle plantarflexion    Ankle inversion    Ankle eversion    (Blank rows = not tested)  BED MOBILITY:  Not tested Pt reports he has to be careful due to size and height on bed.   TRANSFERS: Sit to stand: Modified independence  Assistive device utilized: None     Stand to sit: Modified independence  Assistive device utilized: None      RAMP:  Not tested  CURB:  Not tested  STAIRS: Not tested GAIT: Gait pattern: WFL Distance walked: Various clinic distances  Assistive device utilized: None Level of assistance: Modified independence Comments: Pt demonstrates inferior tilt of R pelvis and L lateral lean compensation.    FUNCTIONAL TESTS:  MCTSIB, 5x STS and FGA to be assessed                                                                                                                               TREATMENT:   Ther Act/NMR Elliptical level 1.5 for 8 min (4 min fwd and 4 retro) for cardiovascular warmup, pelvic mobility and reciprocal coordination.   6 Blaze pods on random reach setting for improved set switching, dual tasking, agility and LE coordination.  Performed on 2 minute intervals with 2 minute standing rest periods.  Pt requires SBA guarding. Round 1:   placed 6 pods in horizontal line over 20' and had pt perform side shuffle to pod and hit pod w/hand.  34 hits. Round 2:  same pod setup, but added dual-task (top pod w/hand if blue and w/foot if red.  18 hits. Had to end at 1:15 due to knee pain.  Notable errors/deficits:  Pt able to identify errors made during round 2 without cues from therapist. Occasional cross-over stepping noted when stepping to R side  Single leg presses using 60# for improved eccentric control, single leg stability and quad strength:  First set: x15 reps  per side w/90 degrees of hip flexion and > 90 degrees of knee flexion.  Second set: x12 reps per side w/forefoot on foot plate only. Pt unable to perform full ROM on L side due to limitations in hip flexion, so educated pt on modifications (Using RLE to start movement to get L foot on/off foot plate)  At wall, hip airplanes w/foam roller, x8 reps per side w/HHA, for improved hip stability, pelvic mobility and single leg stability. Pt w/increased difficulty performing on LLE. Cued pt to perform w/HHA on rack at gym on same side as stance leg.     PATIENT EDUCATION: Education details: Updates to HEP, recommendation to try Vin to Yin yoga and incorporate standing balance poses.  Person educated: Patient Education method: Explanation, Demonstration, Tactile cues, and Verbal cues Education comprehension: verbalized understanding, returned demonstration, verbal cues required, and needs further education  HOME EXERCISE PROGRAM: Access Code: [email or web address] URL: [email or web address] Date: 06/13/2024 Prepared by: Marlon Riana Tessmer  Exercises - Bird Dog with Resistance  - 1 x daily - 7 x weekly - 3 sets - 6-8 reps - 2 second hold - Spanish Squat With Resistance  - 1 x daily - 7 x weekly - 3 sets - 10-12 reps - 2 seconds hold - Forward Step Down  - 1 x daily - 7 x weekly - 3 sets - 10-15 reps - Bridge with Hamstring Curl on Swiss Ball  - 1 x daily - 7 x weekly - 3 sets - 10  reps - Single Leg Bridge on [employer]  - 1 x daily - 7 x weekly - 3 sets - 10 reps - Side Plank with Clam  - 1 x daily - 7 x weekly - 3 sets - 10 reps - Single leg deadlift   - 1 x daily - 7 x weekly - 3 sets - 10 reps - Standing Single-Leg Romanian Deadlift With Hip Airplane  - 1 x daily - 7 x weekly - 3 sets - 10 reps  GOALS: Goals reviewed with patient? Yes  SHORT TERM GOALS: Target date: 06/11/2024   Pt will be independent with initial HEP for improved strength, balance, transfers and gait.  Baseline: initiated on 06/13/24 Goal status: IN PROGRESS  2.  MCTSIB to be assessed and STG/LTG updated  Baseline: 120/120 Goal status: DC DUE TO HIGH BASELINE SCORE   3.  5x STS to be assessed and LTG updated  Baseline:  Goal status: MET  4.  Minibest to be assessed and STG/LTG updated  Baseline:  Goal status: DC    LONG TERM GOALS: Target date: 07/09/2024   Pt will be independent with final HEP for improved strength, balance, transfers and gait.  Baseline:  Goal status: INITIAL  2.  MCTSIB goal  Baseline:  Goal status: DC DUE TO HIGH BASELINE SCORE   3.  MiniBest goal Baseline:  Goal status: DC  4. Pt will improve 5 x STS to less than or equal to 10 seconds w/equal foot placement and no retropulsion to demonstrate improved functional strength and transfer efficiency.   Baseline: 11.62s w/RLE posterior and minor retropulsion  Goal status: INITIAL   ASSESSMENT:  CLINICAL IMPRESSION: Emphasis of skilled PT session on single leg stability, LE coordination, eccentric control of quads and agility. Pt able to maintain balance well during blaze pod activity and could self-correct errors made during dual-task today. Comparatively, pt not aware of errors made during cog dual-tasking last week. Due to R knee pain, DC blaze pod activity  and educated pt on modifications to use leg press machine to improve eccentric control of quads w/reduced pain. Pt continues to be limited by  poor stance control on LLE and did have near miss since last session due to tripping on LLE down steps, but reports he was distracted so will continue to work on dual tasking in PT. Continue POC.   OBJECTIVE IMPAIRMENTS: decreased activity tolerance, decreased balance, decreased cognition, decreased coordination, decreased knowledge of condition, decreased knowledge of use of DME, difficulty walking, decreased strength, impaired perceived functional ability, impaired tone, and improper body mechanics  ACTIVITY LIMITATIONS: stairs, transfers, bed mobility, locomotion level, and caring for others  PARTICIPATION LIMITATIONS: interpersonal relationship, driving, shopping, community activity, occupation, and yard work  PERSONAL FACTORS: Behavior pattern and 1-2 comorbidities: GAD, OCPD and Adjustment disorder are also affecting patient's functional outcome.   REHAB POTENTIAL: Good  CLINICAL DECISION MAKING: Evolving/moderate complexity  EVALUATION COMPLEXITY: Moderate  PLAN:  PT FREQUENCY: 2x/week  PT DURATION: 6 weeks (POC written for 10 weeks due to delay in scheduling over holidays)  PLANNED INTERVENTIONS: 02835- PT Re-evaluation, 97750- Physical Performance Testing, 97110-Therapeutic exercises, 97530- Therapeutic activity, [id number]- Neuromuscular re-education, 97535- Self Care, 02859- Manual therapy, [id number]- Gait training, [phone]- Orthotic Initial, [phone]- Canalith repositioning, [id number]- Aquatic Therapy, [phone]- Electrical stimulation (manual), [phone] (1-2 muscles), [phone] (3+ muscles)- Dry Needling, Patient/Family education, Balance training, Stair training, Joint mobilization, Spinal mobilization, Vestibular training, and DME instructions  PLAN FOR NEXT SESSION:  Monitor vitals. Gait on TM. Work on eccentric control of quads - heel taps, single leg RDLs, single leg bridges, bird dogs, multifidi rotations, squats, rockerboard, resisted blaze pods, half kneel, rebounder, resisted step ups    Marlon BRAVO Aymara Sassi, PT, DPT 07/01/2024, 4:24 PM        "

## 2024-07-04 ENCOUNTER — Ambulatory Visit: Admitting: Physical Therapy

## 2024-07-04 DIAGNOSIS — R2689 Other abnormalities of gait and mobility: Secondary | ICD-10-CM

## 2024-07-04 DIAGNOSIS — R2681 Unsteadiness on feet: Secondary | ICD-10-CM | POA: Diagnosis not present

## 2024-07-04 DIAGNOSIS — M6281 Muscle weakness (generalized): Secondary | ICD-10-CM

## 2024-07-04 NOTE — Therapy (Signed)
 " OUTPATIENT PHYSICAL THERAPY NEURO TREATMENT   Patient Name: Timothy [name] MRN: [id number] DOB:Jun 13, 1956, 69 y.o., male Today's Date: 07/04/2024   PCP: Charlott Dorn LABOR, MD REFERRING PROVIDER: Charlott Dorn LABOR, MD  END OF SESSION:  PT End of Session - 07/04/24 1533     Visit Number 7    Number of Visits 13    Date for Recertification  07/23/24   Due to delay in scheduling   Authorization Type BCBS Medicare    PT Start Time 1532    PT Stop Time 1616    PT Time Calculation (min) 44 min    Activity Tolerance Patient tolerated treatment well    Behavior During Therapy [facility] for tasks assessed/performed           Past Medical History:  Diagnosis Date   Acute meniscal tear of knee    Anxiety    Asthma    CAD (coronary artery disease)    Cancer (HCC)    Cataract    Environmental allergies    GERD (gastroesophageal reflux disease)    History of basal cell carcinoma (BCC) excision    History of detached retina repair    left s/p laser 2019;  right s/p surgical repair 02/ 2015   History of malignant melanoma of skin    04-26-2019 per pt fall 2019 excision from back, localized, and no recurrence   History of prostate cancer urologist --- dr renda   dx 2016-- Stage T1c, Gleason 3+3;  05-11-2015 s/p  radical prostatectomy  (04-26-2019 per pt psa undetectable)   Hx of adenomatous colonic polyps 01/18/2017   Hyperlipidemia    Low platelet count    bruising   Mild obstructive sleep apnea    no longer has to use CPAP wt. loss (pt retested 10-30-2014 epic)   OA (osteoarthritis)    Sleep apnea    oral appliance    Thrombocytopenia, unspecified    04-26-2019  per pt baseline plt count 150   Type 2 diabetes mellitus (HCC)    followed by pcp  (04-26-2019 check's cbg daily,  fasting cbg-- 110-120;  per pt last A1c 5.8)   Wears glasses    Past Surgical History:  Procedure Laterality Date   CATARACT EXTRACTION W/ INTRAOCULAR LENS  IMPLANT, BILATERAL  01/2018    COLONOSCOPY  last one 01-12-2017   GAS INSERTION Right 08/19/2013   Procedure: INSERTION OF GAS;  Surgeon: Arley LABOR Ruder, MD;  Location: [facility] OR;  Service: Ophthalmology;  Laterality: Right;  SF6   KNEE ARTHROSCOPY Left 05/02/2019   Procedure: ARTHROSCOPY KNEE evaluation under anesthesia debridement partial medial meniscectomy chondroplasty;  Surgeon: Gerome Charleston, MD;  Location: [facility];  Service: Orthopedics;  Laterality: Left;  Anesthesia - Femoral nerve block/knee block/IV sedation   LEFT HEART CATH AND CORONARY ANGIOGRAPHY N/A 08/15/2023   Procedure: LEFT HEART CATH AND CORONARY ANGIOGRAPHY;  Surgeon: [name], MD;  Location: [facility] INVASIVE CV LAB;  Service: Cardiovascular;  Laterality: N/A;   MASS EXCISION Left 07/08/2014   Procedure: EXCISION OF KERATOACANTHOMA LEFT LOWER LEG, AND EXCISION OF BASIL CELL CARCINOMA FROM NECK;  Surgeon: [name], MD;  Location: [facility];  Service: General;  Laterality: Left;  left lower leg and posterior neck   NASAL SEPTOPLASTY W/ TURBINOPLASTY  12-13-2001  dr byers @[facility]    PROSTATE BIOPSY  11/11/2014   REPLACEMENT TOTAL KNEE     ROBOT ASSISTED LAPAROSCOPIC RADICAL PROSTATECTOMY N/A 05/11/2015   Procedure: ROBOTIC ASSISTED LAPAROSCOPIC RADICAL  PROSTATECTOMY LEVEL 1;  Surgeon: [name], MD;  Location: [facility] ORS;  Service: Urology;  Laterality: N/A;   SCLERAL BUCKLE WITH CRYO Right 08/19/2013   Procedure: SCLERAL BUCKLE WITH CRYOPEXY;  Surgeon: [name], MD;  Location: [facility] OR;  Service: Ophthalmology;  Laterality: Right;   TONSILLECTOMY AND ADENOIDECTOMY  chiuld   Patient Active Problem List   Diagnosis Date Noted   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   Dry mouth 11/26/2019   Nasal valve collapse 11/26/2019   Sensorineural hearing loss (SNHL) of left ear 11/26/2019   Acquired trigger finger of right middle finger 05/27/2019   Pain in finger of right hand 05/27/2019   Pain in left knee 04/08/2019   Hx of  adenomatous colonic polyps 01/18/2017   Prostate cancer (HCC) 05/11/2015   Malignant neoplasm of prostate (HCC) 12/18/2014   Cough 09/10/2014   Keratoacanthoma of lower leg, 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   Basal cell carcinoma of neck 04/24/2014   Haglund's deformity of right heel 01/16/2014   FOOT PAIN, BILATERAL 04/12/2010   NEOPLASM OF UNCERTAIN BEHAVIOR OF SKIN 10/28/2009   HYPOGONADISM 10/28/2009   Infective otitis externa 10/28/2009   CERUMEN IMPACTION 10/28/2009   ACHILLES TENDINITIS 02/11/2009   PLANTAR FASCIITIS, RIGHT 02/11/2009   Other specified abnormal findings of blood chemistry 08/15/2008   ABNORMAL LIVER FUNCTION TESTS 08/15/2008   OBSTRUCTIVE SLEEP APNEA 10/30/2007   DIABETES MELLITUS, TYPE II 02/03/2007   DYSLIPIDEMIA 02/03/2007   DISORDERS, 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   Asthma 02/03/2007   MICROALBUMINURIA 02/03/2007    ONSET DATE: 05/06/2024 (referral)   REFERRING DIAG:  R26.89 (ICD-10-CM) - Other abnormalities of gait and mobility    THERAPY DIAG:  Unsteadiness on feet  Muscle weakness (generalized)  Other abnormalities of gait and mobility  Rationale for Evaluation and Treatment: Rehabilitation  SUBJECTIVE:                                                                                                                                                                                             SUBJECTIVE STATEMENT: Pt reports he did he exercises at [facility] today. Having difficulty w/hip airplanes w/a long foam roller, did better w/a shorter one. Has to use UE support on his RDLs and airplanes but does not hurt his knee. Feels like he can DC next week. No falls.   Pt accompanied by: self  PERTINENT HISTORY: Adjustment disorder depressed type, OCPD, GAD, anxiety, CAD, GERD  From Dr. 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 His neurological examination is grossly nonfocal. He had some mild apraxia with demonstrating how to ride a bicycle, though no other overt ideomotor apraxia with things like demonstrating how to use tools or other in transit and gestures. The only other gesture that he stumbled with was showing the [facility] greeting from [employer], which he said he has always had a problem demonstrating. His cognitive testing was within cognitively normal range, losing points for missing 2/4 calculations, having an impaired figure cube drawing, and missing 1/4 of the recall items. We were all surprised that he struggled with missing half of the calculations, since math has historically been a strong suit of his. His cognitive screen is within that of a cognitively normal range, though given the changes that they describe, he meets criteria for a mild cognitive impairment. He has been diagnosed with medical comorbidities that could be contributing to impaired attention and focus, such as attention deficit hyperactivity disorder and obstructive sleep apnea. He is currently not being treated for ADHD and had an overstimulating response when put on Ritalin  previously. He has an oral appliance for OSA, since his OSA is not bad enough to necessitate using CPAP, so this is being addressed. There is still concern that he could have a early neurodegenerative dementing process, though his FDG PET scan performed in 2021 that was read as normal would argue against that. He has treatment-behavior, which could be consistent with REM disorder that can be an early sign of cynically neuropathy, something like dementia with Lewy bodies. Because of the  described apraxia and difficulty with balance and coordination, he could have something like corticobasal syndrome, though he is not demonstrating alien limb phenomenon or parkinsonism that might otherwise be seen with this disorder. I am less worried about something like Alzheimer disease.  I recommended the following work-up, including imaging of the brain and comprehensive neuropsychological evaluation. I also recommended he be re-evaluated by sleep medicine, specifically if this is pseudo-REM disorder secondary to obstructive sleep apnea (OSA) versus REM disorder. I prescribed atomoxetine  (Strattera ) with titration up to 40 mg twice daily to address ADHD. I also recommended he seek out physical therapy locally to assist with things like balance and gait. I encourage cognitive and physical exercise, social engagement, and the Mediterranean/MIND diets for cognitive health.   From   PAIN:  Are you having pain? No  PRECAUTIONS: Fall  RED FLAGS: None   WEIGHT BEARING RESTRICTIONS: No  FALLS: Has patient fallen in last 6 months? Yes. Number of falls 1  LIVING ENVIRONMENT: Lives with: lives with their spouse Lives in: House/apartment Stairs: Yes: External: 6 in front, 3 in utility room and 2 in garage  steps; bilateral but cannot reach both Has following equipment at home: None  PLOF: Independent  PATIENT GOALS: I wanna feel safer walking. I want to find out what are my true limitations   OBJECTIVE:  Note: Objective measures were completed at Evaluation unless otherwise noted.  DIAGNOSTIC FINDINGS: MRI of brain completed on 05/13/24, not interpreted at time of eval   COGNITION: Overall cognitive status: Within functional limits for tasks assessed and noted difficulty w/alternating attention. Pt reports anomia and distractibility as well    SENSATION: Pt denies numbness/tingling in BUEs   COORDINATION: Impaired fine motor coordination  of RUE   MUSCLE TONE: RLE: Muscle  atrophy noted of R quad    POSTURE: No Significant postural limitations  LOWER EXTREMITY ROM:     Active  Right Eval Left Eval  Hip flexion    Hip extension    Hip abduction    Hip adduction    Hip internal rotation    Hip external rotation    Knee flexion    Knee extension    Ankle dorsiflexion    Ankle plantarflexion    Ankle inversion    Ankle eversion     (Blank rows = not tested)  LOWER EXTREMITY MMT:  Tested in seated position   MMT Right Eval Left Eval  Hip flexion 4- 4  Hip extension    Hip abduction 4   Hip adduction    Hip internal rotation    Hip external rotation    Knee flexion    Knee extension    Ankle dorsiflexion    Ankle plantarflexion    Ankle inversion    Ankle eversion    (Blank rows = not tested)  BED MOBILITY:  Not tested Pt reports he has to be careful due to size and height on bed.   TRANSFERS: Sit to stand: Modified independence  Assistive device utilized: None     Stand to sit: Modified independence  Assistive device utilized: None      RAMP:  Not tested  CURB:  Not tested  STAIRS: Not tested GAIT: Gait pattern: WFL Distance walked: Various clinic distances  Assistive device utilized: None Level of assistance: Modified independence Comments: Pt demonstrates inferior tilt of R pelvis and L lateral lean compensation.    FUNCTIONAL TESTS:  MCTSIB, 5x STS and FGA to be assessed                                                                                                                               TREATMENT:   Ther Act Elliptical level 2.0 for 8 min (4 min fwd and 4 retro) for cardiovascular warmup, pelvic mobility and reciprocal coordination. Noted improved pelvic rotation this date.  For improved pelvic mobility, glute med strength and core stability. Added to HEP (see bolded below):  On mat table, quadruped hip lifts w/yoga block under contralateral knee, x12 reps per side. Increased difficulty performing on L  side  Supine alt marches from glute bridge position w/OH 15# KB hold, x12 reps per side.  Front squats w/8# dumbbells in front rack position, x15 reps. Min cues to maintain weight on heels and keep chest upright w/movement. Performed w/tap to chair to work on depth.  At rebounder, had pt stand on [employer] w/side facing rebounder and performed alt fwd mini lunge off foam w/rotation towards rebounder and 2kg ball ball throw/catch followed by retro step back onto foam, x10 reps per side per direction. Min A required due to pt moving too quickly and maintaining narrow BOS or missing foam  w/feet. Cued pt to slow down and focus on proper foot positioning and pt progressed to needing CGA only.  Agility ladder drills for improved speed, coordination and motor planning. Briefly reviewed w/pt w/SBA:  Two feet in, two feet out every other square, x2 reps  Two squares fwd, one square retro, x1 rep    PATIENT EDUCATION: Education details: Updates to HEP  Person educated: Patient Education method: Explanation, Demonstration, Tactile cues, and Verbal cues Education comprehension: verbalized understanding, returned demonstration, verbal cues required, and needs further education  HOME EXERCISE PROGRAM: Access Code: [email or web address] URL: [email or web address] Date: 06/13/2024 Prepared by: Marlon Jaye Saal  Exercises - Bird Dog with Resistance  - 1 x daily - 7 x weekly - 3 sets - 6-8 reps - 2 second hold - Spanish Squat With Resistance  - 1 x daily - 7 x weekly - 3 sets - 10-12 reps - 2 seconds hold - Forward Step Down  - 1 x daily - 7 x weekly - 3 sets - 10-15 reps - Bridge with Hamstring Curl on Swiss Ball  - 1 x daily - 7 x weekly - 3 sets - 10 reps - Single Leg Bridge on [employer]  - 1 x daily - 7 x weekly - 3 sets - 10 reps - Side Plank with Clam  - 1 x daily - 7 x weekly - 3 sets - 10 reps - Single leg deadlift   - 1 x daily - 7 x weekly - 3 sets - 10 reps - Standing Single-Leg Romanian Deadlift  With Hip Airplane  - 1 x daily - 7 x weekly - 3 sets - 10 reps - Quadruped Hip Hike on Foam  - 1 x daily - 7 x weekly - 3 sets - 10 reps - Front Squat  - 1 x daily - 7 x weekly - 2-3 sets - 6-8 reps - Marching Bridge  - 1 x daily - 7 x weekly - 3 sets - 10 reps  GOALS: Goals reviewed with patient? Yes  SHORT TERM GOALS: Target date: 06/11/2024   Pt will be independent with initial HEP for improved strength, balance, transfers and gait.  Baseline: initiated on 06/13/24 Goal status: IN PROGRESS  2.  MCTSIB to be assessed and STG/LTG updated  Baseline: 120/120 Goal status: DC DUE TO HIGH BASELINE SCORE   3.  5x STS to be assessed and LTG updated  Baseline:  Goal status: MET  4.  Minibest to be assessed and STG/LTG updated  Baseline:  Goal status: DC    LONG TERM GOALS: Target date: 07/09/2024   Pt will be independent with final HEP for improved strength, balance, transfers and gait.  Baseline:  Goal status: INITIAL  2.  MCTSIB goal  Baseline:  Goal status: DC DUE TO HIGH BASELINE SCORE   3.  MiniBest goal Baseline:  Goal status: DC  4. Pt will improve 5 x STS to less than or equal to 10 seconds w/equal foot placement and no retropulsion to demonstrate improved functional strength and transfer efficiency.   Baseline: 11.62s w/RLE posterior and minor retropulsion  Goal status: INITIAL   ASSESSMENT:  CLINICAL IMPRESSION: Emphasis of skilled PT session on core stability, glute med strength, LE coordination and single leg stability. Pt reports improvement w/eccentric heel taps but continues to be challenged by single leg stability, especially on L side. Pt demonstrates improved stability if he slows down, but requires intermittent cues to do so. Pt initially requiring min A on rebounder  task due to speed of movement and focusing on ball rather than foot placement. However, w/cues to slow down, pt able to perform w/CGA. Pt on track to DC next week due to having gym-based  HEP and plan to return to yoga. Continue POC.   OBJECTIVE IMPAIRMENTS: decreased activity tolerance, decreased balance, decreased cognition, decreased coordination, decreased knowledge of condition, decreased knowledge of use of DME, difficulty walking, decreased strength, impaired perceived functional ability, impaired tone, and improper body mechanics  ACTIVITY LIMITATIONS: stairs, transfers, bed mobility, locomotion level, and caring for others  PARTICIPATION LIMITATIONS: interpersonal relationship, driving, shopping, community activity, occupation, and yard work  PERSONAL FACTORS: Behavior pattern and 1-2 comorbidities: GAD, OCPD and Adjustment disorder are also affecting patient's functional outcome.   REHAB POTENTIAL: Good  CLINICAL DECISION MAKING: Evolving/moderate complexity  EVALUATION COMPLEXITY: Moderate  PLAN:  PT FREQUENCY: 2x/week  PT DURATION: 6 weeks (POC written for 10 weeks due to delay in scheduling over holidays)  PLANNED INTERVENTIONS: 02835- PT Re-evaluation, 97750- Physical Performance Testing, 97110-Therapeutic exercises, 97530- Therapeutic activity, [id number]- Neuromuscular re-education, 97535- Self Care, [id number]- Manual therapy, [id number]- Gait training, [phone]- Orthotic Initial, [phone]- Canalith repositioning, [id number]- Aquatic Therapy, [phone]- Electrical stimulation (manual), [phone] (1-2 muscles), [phone] (3+ muscles)- Dry Needling, Patient/Family education, Balance training, Stair training, Joint mobilization, Spinal mobilization, Vestibular training, and DME instructions  PLAN FOR NEXT SESSION:  Goals and DC. Finalize HEP. Monitor vitals. Gait on TM. Work on eccentric control of quads - heel taps, single leg RDLs, single leg bridges, bird dogs, multifidi rotations, squats, rockerboard, resisted blaze pods, half kneel, rebounder, resisted step ups, bridge on theraball w/chest press, resisted dead bugs    Marlon BRAVO Khalaya Mcgurn, PT, DPT 07/04/2024, 4:19 PM        "

## 2024-07-08 ENCOUNTER — Ambulatory Visit: Admitting: Physical Therapy

## 2024-07-11 ENCOUNTER — Ambulatory Visit: Admitting: Physical Therapy

## 2024-07-12 ENCOUNTER — Ambulatory Visit: Admitting: Physical Therapy

## 2024-07-12 DIAGNOSIS — M6281 Muscle weakness (generalized): Secondary | ICD-10-CM

## 2024-07-12 DIAGNOSIS — R2689 Other abnormalities of gait and mobility: Secondary | ICD-10-CM

## 2024-07-12 DIAGNOSIS — R2681 Unsteadiness on feet: Secondary | ICD-10-CM | POA: Diagnosis not present

## 2024-07-12 NOTE — Therapy (Signed)
 " OUTPATIENT PHYSICAL THERAPY NEURO TREATMENT - DISCHARGE SUMMARY    Patient Name: Timothy [name] MRN: [id number] DOB:Feb 11, 1956, 69 y.o., male Today's Date: 07/12/2024   PCP: Charlott Dorn LABOR, MD REFERRING PROVIDER: Charlott Dorn LABOR, MD  PHYSICAL THERAPY DISCHARGE SUMMARY  Visits from Start of Care: 8  Current functional level related to goals / functional outcomes: Pt is independent w/all ADLS without use of AD   Remaining deficits: Mild RLE weakness, low fall risk   Education / Equipment: HEP    Patient agrees to discharge. Patient goals were met. Patient is being discharged due to being pleased with the current functional level.   END OF SESSION:  PT End of Session - 07/12/24 1408     Visit Number 8    Number of Visits 13    Date for Recertification  07/23/24   Due to delay in scheduling   Authorization Type BCBS Medicare    PT Start Time 1403    PT Stop Time 1439   DC   PT Time Calculation (min) 36 min    Activity Tolerance Patient tolerated treatment well    Behavior During Therapy [facility] for tasks assessed/performed            Past Medical History:  Diagnosis Date   Acute meniscal tear of knee    Anxiety    Asthma    CAD (coronary artery disease)    Cancer (HCC)    Cataract    Environmental allergies    GERD (gastroesophageal reflux disease)    History of basal cell carcinoma (BCC) excision    History of detached retina repair    left s/p laser 2019;  right s/p surgical repair 02/ 2015   History of malignant melanoma of skin    04-26-2019 per pt fall 2019 excision from back, localized, and no recurrence   History of prostate cancer urologist --- dr renda   dx 2016-- Stage T1c, Gleason 3+3;  05-11-2015 s/p  radical prostatectomy  (04-26-2019 per pt psa undetectable)   Hx of adenomatous colonic polyps 01/18/2017   Hyperlipidemia    Low platelet count    bruising   Mild obstructive sleep apnea    no longer has to use CPAP wt. loss (pt  retested 10-30-2014 epic)   OA (osteoarthritis)    Sleep apnea    oral appliance    Thrombocytopenia, unspecified    04-26-2019  per pt baseline plt count 150   Type 2 diabetes mellitus (HCC)    followed by pcp  (04-26-2019 check's cbg daily,  fasting cbg-- 110-120;  per pt last A1c 5.8)   Wears glasses    Past Surgical History:  Procedure Laterality Date   CATARACT EXTRACTION W/ INTRAOCULAR LENS  IMPLANT, BILATERAL  01/2018   COLONOSCOPY  last one 01-12-2017   GAS INSERTION Right 08/19/2013   Procedure: INSERTION OF GAS;  Surgeon: Arley LABOR Ruder, MD;  Location: [facility] OR;  Service: Ophthalmology;  Laterality: Right;  SF6   KNEE ARTHROSCOPY Left 05/02/2019   Procedure: ARTHROSCOPY KNEE evaluation under anesthesia debridement partial medial meniscectomy chondroplasty;  Surgeon: Gerome Charleston, MD;  Location: [facility];  Service: Orthopedics;  Laterality: Left;  Anesthesia - Femoral nerve block/knee block/IV sedation   LEFT HEART CATH AND CORONARY ANGIOGRAPHY N/A 08/15/2023   Procedure: LEFT HEART CATH AND CORONARY ANGIOGRAPHY;  Surgeon: [name], MD;  Location: [facility] INVASIVE CV LAB;  Service: Cardiovascular;  Laterality: N/A;   MASS EXCISION Left 07/08/2014   Procedure:  EXCISION OF KERATOACANTHOMA LEFT LOWER LEG, AND EXCISION OF BASIL CELL CARCINOMA FROM NECK;  Surgeon: [name], MD;  Location: [facility];  Service: General;  Laterality: Left;  left lower leg and posterior neck   NASAL SEPTOPLASTY W/ TURBINOPLASTY  12-13-2001  dr byers @[facility]    PROSTATE BIOPSY  11/11/2014   REPLACEMENT TOTAL KNEE     ROBOT ASSISTED LAPAROSCOPIC RADICAL PROSTATECTOMY N/A 05/11/2015   Procedure: ROBOTIC ASSISTED LAPAROSCOPIC RADICAL PROSTATECTOMY LEVEL 1;  Surgeon: [name], MD;  Location: [facility] ORS;  Service: Urology;  Laterality: N/A;   SCLERAL BUCKLE WITH CRYO Right 08/19/2013   Procedure: SCLERAL BUCKLE WITH CRYOPEXY;  Surgeon: [name], MD;  Location: [facility] OR;   Service: Ophthalmology;  Laterality: Right;   TONSILLECTOMY AND ADENOIDECTOMY  chiuld   Patient Active Problem List   Diagnosis Date Noted   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   Dry mouth 11/26/2019   Nasal valve collapse 11/26/2019   Sensorineural hearing loss (SNHL) of left ear 11/26/2019   Acquired trigger finger of right middle finger 05/27/2019   Pain in finger of right hand 05/27/2019   Pain in left knee 04/08/2019   Hx of adenomatous colonic polyps 01/18/2017   Prostate cancer (HCC) 05/11/2015   Malignant neoplasm of prostate (HCC) 12/18/2014   Cough 09/10/2014   Keratoacanthoma of lower leg, 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   Basal cell carcinoma of neck 04/24/2014   Haglund's deformity of right heel 01/16/2014   FOOT PAIN, BILATERAL 04/12/2010   NEOPLASM OF UNCERTAIN BEHAVIOR OF SKIN 10/28/2009   HYPOGONADISM 10/28/2009   Infective otitis externa 10/28/2009   CERUMEN IMPACTION 10/28/2009   ACHILLES TENDINITIS 02/11/2009   PLANTAR FASCIITIS, RIGHT 02/11/2009   Other specified abnormal findings of blood chemistry 08/15/2008   ABNORMAL LIVER FUNCTION TESTS 08/15/2008   OBSTRUCTIVE SLEEP APNEA 10/30/2007   DIABETES MELLITUS, TYPE II 02/03/2007   DYSLIPIDEMIA 02/03/2007   DISORDERS, 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   Asthma 02/03/2007   MICROALBUMINURIA 02/03/2007    ONSET DATE: 05/06/2024 (referral)   REFERRING DIAG:  R26.89 (ICD-10-CM) - Other abnormalities of gait and mobility    THERAPY DIAG:  Unsteadiness on feet  Muscle weakness (generalized)  Other abnormalities of gait and mobility  Rationale for Evaluation and Treatment: Rehabilitation  SUBJECTIVE:                                                                                                                                                                                             SUBJECTIVE STATEMENT: Pt reports doing well, did his HEP at [facility] this AM. Has a few questions about his HEP. No  falls or acute  changes. Feels ready to DC today. Is returning to yoga next week.   Pt accompanied by: self  PERTINENT HISTORY: Adjustment disorder depressed type, OCPD, GAD, anxiety, CAD, GERD  From Dr. 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 His neurological examination is grossly nonfocal. He had some mild apraxia with demonstrating how to ride a bicycle, though no other overt ideomotor apraxia with things like demonstrating how to use tools or other in transit and gestures. The only other gesture that he stumbled with was showing the [employer] greeting from [employer], which he said he has always had a problem demonstrating. His cognitive testing was within cognitively normal range, losing points for missing 2/4 calculations, having an impaired figure cube drawing, and missing 1/4 of the recall items. We were all surprised that he struggled with missing half of the calculations, since math has historically been a strong suit of his. His cognitive screen is within that of a cognitively normal range, though given the changes that they describe, he meets criteria for a mild cognitive impairment. He has been diagnosed with medical comorbidities that could be contributing to impaired attention and focus, such as attention deficit hyperactivity disorder and obstructive sleep apnea. He is currently not being treated for ADHD and had an overstimulating response when put on Ritalin  previously. He has an oral appliance for OSA, since his OSA is not bad enough to necessitate using CPAP, so this is being  addressed. There is still concern that he could have a early neurodegenerative dementing process, though his FDG PET scan performed in 2021 that was read as normal would argue against that. He has treatment-behavior, which could be consistent with REM disorder that can be an early sign of cynically neuropathy, something like dementia with Lewy bodies. 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 I am less worried about something like Alzheimer disease.  I recommended the following work-up, including imaging of the brain and comprehensive neuropsychological evaluation. I also recommended he be re-evaluated by sleep medicine, specifically if this is pseudo-REM disorder secondary to obstructive sleep apnea (OSA) versus REM disorder. I prescribed atomoxetine  (Strattera ) with titration up to 40 mg twice daily to address ADHD. I also recommended he seek out physical therapy locally to assist with things like balance and gait. I encourage cognitive and physical exercise, social engagement, and the Mediterranean/MIND diets for cognitive health.   From   PAIN:  Are you having pain? No  PRECAUTIONS: Fall  RED FLAGS: None   WEIGHT BEARING RESTRICTIONS: No  FALLS: Has patient fallen in last 6 months? Yes. Number of falls 1  LIVING ENVIRONMENT: Lives with: lives with their spouse Lives in: House/apartment Stairs: Yes: External: 6 in front, 3 in utility room and 2 in garage  steps; bilateral but cannot reach both Has following equipment at home: None  PLOF: Independent  PATIENT GOALS: I wanna feel safer walking. I want to find out what are my true limitations   OBJECTIVE:  Note: Objective measures were completed at Evaluation unless otherwise noted.  DIAGNOSTIC FINDINGS: MRI of brain completed  on 05/13/24, not interpreted at time of eval   COGNITION: Overall cognitive status: Within functional limits for tasks assessed and noted difficulty w/alternating attention. Pt reports anomia and distractibility as well    SENSATION: Pt denies numbness/tingling in BUEs   COORDINATION: Impaired fine motor coordination of RUE   MUSCLE TONE: RLE: Muscle atrophy noted of R quad    POSTURE: No Significant postural limitations  LOWER EXTREMITY ROM:     Active  Right Eval Left Eval  Hip flexion    Hip extension    Hip abduction    Hip adduction    Hip internal rotation    Hip external rotation    Knee flexion    Knee extension    Ankle dorsiflexion    Ankle plantarflexion    Ankle inversion    Ankle eversion     (Blank rows = not tested)  LOWER EXTREMITY MMT:  Tested in seated position   MMT Right Eval Left Eval  Hip flexion 4- 4  Hip extension    Hip abduction 4   Hip adduction    Hip internal rotation    Hip external rotation    Knee flexion    Knee extension    Ankle dorsiflexion    Ankle plantarflexion    Ankle inversion    Ankle eversion    (Blank rows = not tested)  BED MOBILITY:  Not tested Pt reports he has to be careful due to size and height on bed.   TRANSFERS: Sit to stand: Modified independence  Assistive device utilized: None     Stand to sit: Modified independence  Assistive device utilized: None      RAMP:  Not tested  CURB:  Not tested  STAIRS: Not tested GAIT: Gait pattern: WFL Distance walked: Various clinic distances  Assistive device utilized: None Level of assistance: Modified independence Comments: Pt demonstrates inferior tilt of R pelvis and L lateral lean compensation.    FUNCTIONAL TESTS:  MCTSIB, 5x STS and FGA to be assessed                                                                                                                               TREATMENT:   Ther Act - LTG Assessment   [facility] PT Assessment -  07/12/24 1412       Balance   Balance Assessed Yes      Standardized Balance Assessment   Standardized Balance Assessment Five Times Sit to Stand    Five times sit to stand comments  9.09s   No UE support, good anterior weight shift, equal foot placement.        Reviewed the following from pt's HEP to ensure proper technique:  Hip airplanes w/foam roller, x12 reps on LLE and x5 reps on RLE. Mod multimodal cues to rotate pelvis, rather than  thoracic spine.  B-stance RDLs w/15# KB, x10 reps per side. Updated wording on pt's HEP to staggered stance rather than B-stance  Supine chest presses on ball theraball w/8# dumbbells, x10 reps, for improved posterior chain strength, core stability and shoulder strength. Added to HEP (see bolded below)  Discussed results of 5x STS and when to return to PT in future. Encouraged pt to work on [employer] w/his psychologist, educational at [employer] and psychologist, educational can modify exercises as he sees fit in order to progress pt.    PATIENT EDUCATION: Education details: Finalized HEP, goal results, return to PT if there are notable changes in balance or need for updated HEP Person educated: Patient Education method: Explanation, Demonstration, Tactile cues, and Verbal cues Education comprehension: verbalized understanding, returned demonstration, and verbal cues required  HOME EXERCISE PROGRAM: Access Code: [email or web address] URL: [email or web address] Date: 06/13/2024 Prepared by: Marlon Miral Hoopes  Exercises - Bird Dog with Resistance  - 1 x daily - 7 x weekly - 3 sets - 6-8 reps - 2 second hold - Spanish Squat With Resistance  - 1 x daily - 7 x weekly - 3 sets - 10-12 reps - 2 seconds hold - Forward Step Down  - 1 x daily - 7 x weekly - 3 sets - 10-15 reps - Bridge with Hamstring Curl on Swiss Ball  - 1 x daily - 7 x weekly - 3 sets - 10 reps - Single Leg Bridge on [employer]  - 1 x daily - 7 x weekly - 3 sets - 10 reps - Side Plank with Clam  - 1 x daily - 7 x weekly - 3  sets - 10 reps - Single leg deadlift   - 1 x daily - 7 x weekly - 3 sets - 10 reps - Standing Single-Leg Romanian Deadlift With Hip Airplane  - 1 x daily - 7 x weekly - 3 sets - 10 reps - Quadruped Hip Hike on Foam  - 1 x daily - 7 x weekly - 3 sets - 10 reps - Front Squat  - 1 x daily - 7 x weekly - 2-3 sets - 6-8 reps - Marching Bridge  - 1 x daily - 7 x weekly - 3 sets - 10 reps - Supine Bridge with Chest Press on [employer]  - 1 x daily - 7 x weekly - 3 sets - 10 reps  GOALS: Goals reviewed with patient? Yes  SHORT TERM GOALS: Target date: 06/11/2024   Pt will be independent with initial HEP for improved strength, balance, transfers and gait.  Baseline: initiated on 06/13/24 Goal status: IN PROGRESS  2.  MCTSIB to be assessed and STG/LTG updated  Baseline: 120/120 Goal status: DC DUE TO HIGH BASELINE SCORE   3.  5x STS to be assessed and LTG updated  Baseline:  Goal status: MET  4.  Minibest to be assessed and STG/LTG updated  Baseline:  Goal status: DC    LONG TERM GOALS: Target date: 07/09/2024   Pt will be independent with final HEP for improved strength, balance, transfers and gait.  Baseline:  Goal status: MET  2.  MCTSIB goal  Baseline:  Goal status: DC DUE TO HIGH BASELINE SCORE   3.  MiniBest goal Baseline:  Goal status: DC  4. Pt will improve 5 x STS to less than or equal to 10 seconds w/equal foot placement and no retropulsion to demonstrate improved functional strength and transfer efficiency.   Baseline: 11.62s w/RLE posterior and minor retropulsion;  9.09s w/no retropulsion or BUE support  Goal status: MET   ASSESSMENT:  CLINICAL IMPRESSION: Emphasis of skilled PT session on LTG assessment, finalizing HEP and DC from PT. Pt has met both LTGs, demonstrating and reporting independence w/HEP and improving time on 5x STS without retropulsion and with proper foot placement. Pt has not had any falls since starting PT and has made improvements in BLE  strength, core stability and LE coordination. Pt in agreement to DC this date to transition to working with systems analyst at [employer] and returning to yoga for continued strength training and balance. Encouraged pt to return to PT in future if balance changes or to update HEP.   OBJECTIVE IMPAIRMENTS: decreased activity tolerance, decreased balance, decreased cognition, decreased coordination, decreased knowledge of condition, decreased knowledge of use of DME, difficulty walking, decreased strength, impaired perceived functional ability, impaired tone, and improper body mechanics  ACTIVITY LIMITATIONS: stairs, transfers, bed mobility, locomotion level, and caring for others  PARTICIPATION LIMITATIONS: interpersonal relationship, driving, shopping, community activity, occupation, and yard work  PERSONAL FACTORS: Behavior pattern and 1-2 comorbidities: GAD, OCPD and Adjustment disorder are also affecting patient's functional outcome.   REHAB POTENTIAL: Good  CLINICAL DECISION MAKING: Evolving/moderate complexity  EVALUATION COMPLEXITY: Moderate  PLAN:  PT FREQUENCY: 2x/week  PT DURATION: 6 weeks (POC written for 10 weeks due to delay in scheduling over holidays)  PLANNED INTERVENTIONS: 02835- PT Re-evaluation, 97750- Physical Performance Testing, 97110-Therapeutic exercises, 97530- Therapeutic activity, 97112- Neuromuscular re-education, 97535- Self Care, 02859- Manual therapy, [phone]- Gait training, [phone]- Orthotic Initial, [phone]- Canalith repositioning, [id number]- Aquatic Therapy, [phone]- Electrical stimulation (manual), [phone] (1-2 muscles), [id number] (3+ muscles)- Dry Needling, Patient/Family education, Balance training, Stair training, Joint mobilization, Spinal mobilization, Vestibular training, and DME instructions    Marlon BRAVO Dillion Stowers, PT, DPT 07/12/2024, 2:42 PM        "

## 2024-07-26 ENCOUNTER — Other Ambulatory Visit (HOSPITAL_BASED_OUTPATIENT_CLINIC_OR_DEPARTMENT_OTHER): Payer: Self-pay

## 2024-07-26 MED ORDER — ERYTHROMYCIN 5 MG/GM OP OINT
TOPICAL_OINTMENT | OPHTHALMIC | 0 refills | Status: AC
  Filled 2024-07-26: qty 3.5, 10d supply, fill #0

## 2024-08-01 ENCOUNTER — Other Ambulatory Visit (HOSPITAL_BASED_OUTPATIENT_CLINIC_OR_DEPARTMENT_OTHER): Payer: Self-pay

## 2024-08-01 MED ORDER — PREDNISONE 10 MG PO TABS
ORAL_TABLET | ORAL | 0 refills | Status: AC
  Filled 2024-08-01: qty 15, 5d supply, fill #0

## 2024-08-01 MED ORDER — AZITHROMYCIN 250 MG PO TABS
ORAL_TABLET | ORAL | 0 refills | Status: AC
  Filled 2024-08-01: qty 6, 5d supply, fill #0

## 2024-08-01 MED ORDER — ROSUVASTATIN CALCIUM 20 MG PO TABS
20.0000 mg | ORAL_TABLET | Freq: Every day | ORAL | 3 refills | Status: AC
  Filled 2024-08-01: qty 90, 90d supply, fill #0

## 2024-08-01 MED ORDER — ALBUTEROL SULFATE HFA 108 (90 BASE) MCG/ACT IN AERS
1.0000 | INHALATION_SPRAY | RESPIRATORY_TRACT | 1 refills | Status: AC | PRN
  Filled 2024-08-01: qty 6.7, 17d supply, fill #0
